# Patient Record
Sex: Female | Born: 1940 | Race: White | Hispanic: No | Smoking: Current every day smoker
Health system: Southern US, Community
[De-identification: ages and names within clinical notes are randomized; demographics above are authoritative.]

## PROBLEM LIST (undated history)

## (undated) DIAGNOSIS — F32A Depression, unspecified: Secondary | ICD-10-CM

## (undated) DIAGNOSIS — C3490 Malignant neoplasm of unspecified part of unspecified bronchus or lung: Secondary | ICD-10-CM

## (undated) DIAGNOSIS — M1990 Unspecified osteoarthritis, unspecified site: Secondary | ICD-10-CM

## (undated) DIAGNOSIS — R918 Other nonspecific abnormal finding of lung field: Secondary | ICD-10-CM

## (undated) DIAGNOSIS — E785 Hyperlipidemia, unspecified: Secondary | ICD-10-CM

## (undated) DIAGNOSIS — J449 Chronic obstructive pulmonary disease, unspecified: Secondary | ICD-10-CM

## (undated) DIAGNOSIS — Z8614 Personal history of Methicillin resistant Staphylococcus aureus infection: Secondary | ICD-10-CM

## (undated) DIAGNOSIS — F329 Major depressive disorder, single episode, unspecified: Secondary | ICD-10-CM

## (undated) HISTORY — DX: Unspecified osteoarthritis, unspecified site: M19.90

## (undated) HISTORY — PX: TONSILLECTOMY: SUR1361

## (undated) HISTORY — DX: Depression, unspecified: F32.A

## (undated) HISTORY — DX: Other nonspecific abnormal finding of lung field: R91.8

## (undated) HISTORY — DX: Hyperlipidemia, unspecified: E78.5

## (undated) HISTORY — PX: TUBAL LIGATION: SHX77

## (undated) HISTORY — DX: Major depressive disorder, single episode, unspecified: F32.9

## (undated) HISTORY — PX: CATARACT EXTRACTION W/ INTRAOCULAR LENS  IMPLANT, BILATERAL: SHX1307

## (undated) HISTORY — DX: Malignant neoplasm of unspecified part of unspecified bronchus or lung: C34.90

---

## 2003-01-04 HISTORY — PX: CEREBRAL ANEURYSM REPAIR: SHX164

## 2015-01-04 HISTORY — PX: TOTAL HIP ARTHROPLASTY: SHX124

## 2017-01-01 ENCOUNTER — Other Ambulatory Visit: Payer: Self-pay | Admitting: Internal Medicine

## 2017-01-01 DIAGNOSIS — Z1231 Encounter for screening mammogram for malignant neoplasm of breast: Secondary | ICD-10-CM

## 2017-01-22 ENCOUNTER — Ambulatory Visit: Payer: Medicare Other

## 2017-01-28 ENCOUNTER — Ambulatory Visit
Admission: RE | Admit: 2017-01-28 | Discharge: 2017-01-28 | Disposition: A | Discharge status: Home / Self Care | Payer: Medicare Other | Source: Ambulatory Visit | Attending: Internal Medicine | Admitting: Internal Medicine

## 2017-01-28 DIAGNOSIS — Z1231 Encounter for screening mammogram for malignant neoplasm of breast: Secondary | ICD-10-CM | POA: Insufficient documentation

## 2017-01-28 DIAGNOSIS — R928 Other abnormal and inconclusive findings on diagnostic imaging of breast: Secondary | ICD-10-CM | POA: Insufficient documentation

## 2017-01-31 ENCOUNTER — Other Ambulatory Visit: Payer: Self-pay | Admitting: Internal Medicine

## 2017-01-31 DIAGNOSIS — N6489 Other specified disorders of breast: Secondary | ICD-10-CM

## 2017-01-31 DIAGNOSIS — R928 Other abnormal and inconclusive findings on diagnostic imaging of breast: Secondary | ICD-10-CM

## 2017-02-07 ENCOUNTER — Encounter: Payer: Self-pay | Admitting: Radiology

## 2017-02-07 ENCOUNTER — Ambulatory Visit
Admission: RE | Admit: 2017-02-07 | Discharge: 2017-02-07 | Disposition: A | Discharge status: Home / Self Care | Payer: Medicare Other | Source: Ambulatory Visit | Attending: Internal Medicine | Admitting: Internal Medicine

## 2017-02-07 DIAGNOSIS — R928 Other abnormal and inconclusive findings on diagnostic imaging of breast: Secondary | ICD-10-CM

## 2017-02-07 DIAGNOSIS — N6489 Other specified disorders of breast: Secondary | ICD-10-CM | POA: Insufficient documentation

## 2017-05-03 HISTORY — PX: OTHER SURGICAL HISTORY: SHX169

## 2017-05-16 DIAGNOSIS — H3320 Serous retinal detachment, unspecified eye: Secondary | ICD-10-CM | POA: Insufficient documentation

## 2017-05-16 DIAGNOSIS — H3321 Serous retinal detachment, right eye: Secondary | ICD-10-CM | POA: Insufficient documentation

## 2017-12-06 ENCOUNTER — Encounter (INDEPENDENT_AMBULATORY_CARE_PROVIDER_SITE_OTHER): Payer: Self-pay

## 2017-12-06 ENCOUNTER — Other Ambulatory Visit: Payer: Self-pay

## 2017-12-06 ENCOUNTER — Inpatient Hospital Stay: Payer: Medicare Other | Attending: Hematology and Oncology | Admitting: Hematology and Oncology

## 2017-12-06 ENCOUNTER — Encounter: Payer: Self-pay | Admitting: Hematology and Oncology

## 2017-12-06 ENCOUNTER — Inpatient Hospital Stay: Payer: Medicare Other

## 2017-12-06 ENCOUNTER — Encounter: Payer: Self-pay | Admitting: *Deleted

## 2017-12-06 VITALS — BP 135/78 | HR 87 | Temp 97.7°F | Resp 18 | Ht 63.39 in | Wt 131.3 lb

## 2017-12-06 DIAGNOSIS — F17210 Nicotine dependence, cigarettes, uncomplicated: Secondary | ICD-10-CM

## 2017-12-06 DIAGNOSIS — F329 Major depressive disorder, single episode, unspecified: Secondary | ICD-10-CM | POA: Insufficient documentation

## 2017-12-06 DIAGNOSIS — M1990 Unspecified osteoarthritis, unspecified site: Secondary | ICD-10-CM

## 2017-12-06 DIAGNOSIS — Z79899 Other long term (current) drug therapy: Secondary | ICD-10-CM | POA: Insufficient documentation

## 2017-12-06 DIAGNOSIS — R918 Other nonspecific abnormal finding of lung field: Secondary | ICD-10-CM

## 2017-12-06 LAB — COMPREHENSIVE METABOLIC PANEL
ALBUMIN: 4 g/dL (ref 3.5–5.0)
ALK PHOS: 76 U/L (ref 38–126)
ALT: 14 U/L (ref 0–44)
ANION GAP: 6 (ref 5–15)
AST: 22 U/L (ref 15–41)
BILIRUBIN TOTAL: 0.8 mg/dL (ref 0.3–1.2)
BUN: 18 mg/dL (ref 8–23)
CALCIUM: 9.1 mg/dL (ref 8.9–10.3)
CO2: 26 mmol/L (ref 22–32)
Chloride: 110 mmol/L (ref 98–111)
Creatinine, Ser: 0.94 mg/dL (ref 0.44–1.00)
GFR calc Af Amer: >60 mL/min (ref >60)
GFR calc non Af Amer: 57 mL/min — ABNORMAL LOW (ref >60)
GLUCOSE: 96 mg/dL (ref 70–99)
Potassium: 4.2 mmol/L (ref 3.5–5.1)
Sodium: 142 mmol/L (ref 135–145)
TOTAL PROTEIN: 6.4 g/dL — AB (ref 6.5–8.1)

## 2017-12-06 LAB — CBC WITH DIFFERENTIAL/PLATELET
Basophils Absolute: 0.1 10*3/uL (ref 0–0.1)
Basophils Relative: 1 %
EOS PCT: 4 %
Eosinophils Absolute: 0.2 10*3/uL (ref 0–0.7)
HEMATOCRIT: 40.7 % (ref 35.0–47.0)
Hemoglobin: 14.2 g/dL (ref 12.0–16.0)
LYMPHS ABS: 1.5 10*3/uL (ref 1.0–3.6)
LYMPHS PCT: 27 %
MCH: 32.9 pg (ref 26.0–34.0)
MCHC: 34.9 g/dL (ref 32.0–36.0)
MCV: 94.3 fL (ref 80.0–100.0)
MONO ABS: 0.4 10*3/uL (ref 0.2–0.9)
MONOS PCT: 8 %
Neutro Abs: 3.5 10*3/uL (ref 1.4–6.5)
Neutrophils Relative %: 60 %
PLATELETS: 190 10*3/uL (ref 150–440)
RBC: 4.31 MIL/uL (ref 3.80–5.20)
RDW: 13.3 % (ref 11.5–14.5)
WBC: 5.7 10*3/uL (ref 3.6–11.0)

## 2017-12-06 NOTE — Progress Notes (Signed)
  Oncology Nurse Navigator Documentation  Navigator Location: [facility] (12/06/17 1100) Referral date to RadOnc/MedOnc: 11/28/17 (12/06/17 1100) )Navigator Encounter Type: Clinic/MDC (12/06/17 1100)   Abnormal Finding Date: 11/25/17 (12/06/17 1100)           Multidisiplinary Clinic Date: 12/06/17 (12/06/17 1100) Multidisiplinary Clinic Type: Thoracic (12/06/17 1100)   Patient Visit Type: MedOnc (12/06/17 1100) Treatment Phase: Abnormal Scans (12/06/17 1100) Barriers/Navigation Needs: Coordination of Care (12/06/17 1100)   Interventions: Coordination of Care (12/06/17 1100)   Coordination of Care: Appts;Radiology (12/06/17 1100)        Acuity: Level 2 (12/06/17 1100)   Acuity Level 2: Initial guidance, education and coordination as needed;Educational needs;Assistance expediting appointments (12/06/17 1100)  met with patient and her daughter during initial med-onc consultation with Dr. Mike Gip. All imaging reviewed with patient. Informed that will arrange for PET scan and PFT's to see if patient will need surgical evaluation. Reviewed upcoming appts with patient. Contact info given and informed to call with any further questions or needs. Pt and daughter verbalized understanding.    Time Spent with Patient: 60 (12/06/17 1100)

## 2017-12-06 NOTE — Progress Notes (Signed)
[facility] day:  12/06/2017  Chief Complaint: Latoya Lynch is a 77 y.o. female with a lung mass who is referred in consultation by Dr. Elijio Lynch for assessment and management.  HPI: The patient has a 30 pack year smoking history.  She currently smokes 1/2 pack per day.  She denies any respiratory symptoms.  The patient has a history of cerebral aneurysm in 2004.  Follow-up imaging revealed "a little spot" (no report available).  Follow-up chest CT was ordered.  Chest CT on 11/25/2017 at [facility] revealed a 1.7 x 1.1 x 1.3 cm spiculated mass at the right lung base suspicious for primary pulmonary neoplasm.  Symptomatically, she is feeling "fine". She suffers from occasional migraines. Patient had recent surgery on her RIGHT eye to correct a retinal detachment. Patient denies shortness of breath. She is a smoker, which causes her to have a chronic cough. Patient denies bleeding; no hematochezia, melena, or gross hematuria. She refuses to have a colonoscopy, but did have non-invasive screening (Cologuard) last year that result as negative.   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 She uses OTC interventions, which are effective. Patient has an area of skin concern to the dorsal aspect of her LEFT hand.   Patient has chronic balance issues. Patient notes that her gait is "wobbly".   Patient's maternal grandmother developed terminal "spinal cancer" in her 89-80s.   Past Medical History:  Diagnosis Date  . Depression   . Hyperlipidemia     Past Surgical History:  Procedure Laterality Date  . 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 retinal detatchment Right 05/2017  . TONSILLECTOMY    . TOTAL HIP ARTHROPLASTY Right 01/2015    Family History  Problem Relation Age of Onset  . Heart attack Mother   . Cancer Maternal Grandmother 80       spine ca    Social History:  reports that she has been smoking  cigarettes. She has a 30.00 pack-year smoking history. She has never used smokeless tobacco. She reports that she drank alcohol. She reports that she does not use drugs.  She has smoked 1/2 pack/day since age 24.  She denies any exposure to radiation or toxins.  Patient recently moved from [place] to [place].  Sh lives alone with 2 cats.  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  The patient is accompanied by her daughter, Latoya Lynch,  today.  Allergies:  Allergies  Allergen Reactions  . Peanut Oil Anaphylaxis  . Prochlorperazine Anaphylaxis    Current Medications: Current Outpatient Medications  Medication Sig Dispense Refill  . b complex vitamins tablet Take 1 tablet by mouth daily.     Marland Kitchen buPROPion (WELLBUTRIN SR) 150 MG 12 hr tablet Take 150 mg by mouth daily.     . naproxen sodium (ALEVE) 220 MG tablet Take 220 mg by mouth.    . Omega-3 1000 MG CAPS Take by mouth.     . rosuvastatin (CRESTOR) 10 MG tablet TK 1 T PO QPM  0  . calcium-vitamin D (OSCAL WITH D) 500-200 MG-UNIT TABS tablet Take by mouth.    . EPINEPHrine (EPIPEN JR) 0.15 MG/0.3ML injection Inject into the muscle.    . ibuprofen (ADVIL,MOTRIN) 200 MG tablet Take 200 mg by mouth.      No current facility-administered medications for this visit.  Review of Systems:  GENERAL:  Feels well.  No fevers, sweats or weight loss. PERFORMANCE STATUS (ECOG):  1 HEENT:  s/p right retinal detachment surgery in 05/2017.  No runny nose, sore throat, mouth sores or tenderness. Lungs: No shortness of breath.  Occasional cough.  No hemoptysis. Cardiac:  No chest pain, palpitations, orthopnea, or PND. GI:  No nausea, vomiting, diarrhea, constipation, melena or hematochezia.  Cologuard 2018. GU:  No urgency, frequency, dysuria, or hematuria. Musculoskeletal:  s/p right hip replacement 2016 with intermittent pain.   Back pain treated with Aleve prn. No muscle tenderness. Extremities:  No pain or swelling. Skin:  Left wrist skin nodule (plans to see dermatologist).  No rashes or skin changes. Neuro:  Occasional headache.  Coordination/balance issues since brain aneurysm in 2004.  No numbness or focal weakness. Endocrine:  No diabetes, thyroid issues, hot flashes or night sweats. Psych:  No mood changes, depression or anxiety. Pain:  No focal pain. Review of systems:  All other systems reviewed and found to be negative.  Physical Exam: Blood pressure 135/78, pulse 87, temperature 97.7 F (36.5 C), temperature source Tympanic, resp. rate 18, height 5' 3.39" (1.61 m), weight 131 lb 4.8 oz (59.6 kg). GENERAL:  Well developed, well nourished, woman sitting comfortably in the exam room in no acute distress. MENTAL STATUS:  Alert and oriented to person, place and time. HEAD:  Short red hair.  Normocephalic, atraumatic, face symmetric, no Cushingoid features. EYES:  Glasses.  Hazel eyes.  Pupils equal round and reactive to light and accomodation.  No conjunctivitis or scleral icterus. ENT:  Oropharynx clear without lesion.  Tongue normal.  Partial metal plate.  Mucous membranes moist.  RESPIRATORY:  Clear to auscultation without rales, wheezes or rhonchi. CARDIOVASCULAR:  Regular rate and rhythm without murmur, rub or gallop. ABDOMEN:  Soft, non-tender, with active bowel sounds, and no hepatosplenomegaly.  No masses. SKIN:  Left wrist skin lesion.  No rashes, ulcers or lesions. EXTREMITIES: No edema, no skin discoloration or tenderness.  No palpable cords. LYMPH NODES: No palpable cervical, supraclavicular, axillary or inguinal adenopathy  NEUROLOGICAL: Unremarkable. PSYCH:  Appropriate.   No visits with results within 3 Day(s) from this visit.  Latest known visit with results is:  No results found for any previous visit.    Assessment:  Latoya Lynch is a 77 y.o. female with spiculated right  lung lesion found incidentally.  She has a 30 pack year smoking.  Chest CT on 11/25/2017 at [facility] revealed a 1.7 x 1.1 x 1.3 cm spiculated mass at the right lung base suspicious for primary pulmonary neoplasm.  Symptomatically, she feels "fine".  She denies any respiratory symptoms.  Exam is unremarkable.   Plan: 1.  Labs today:  CBC with diff, CMP. 2.  Right lung nodule:  Discuss results of CT scan.  Images not available during her appointment for review as performed at outside facility.  Disk obtained and will be loaded onto system.  Discuss concern for lung cancer.  Report indicates no adenopathy (N0).  Lesion is small (T1b).  Clinical stage IA if primary lung cancer diagnosed.  Discuss plan for PET scan and PFTs.  If c/w lung cancer and early stage disease, discuss referral to cardiothoracic surgery.  Patient states if surgery needed, she will postpone until after her art show on 10/26 and 10/27.  Present at tumor board. 3.  Schedule PET scan. 4.  Schedule PFTs. 5.  RTC after PET scan for MD assessment, review  of results, and discussion regarding direction of therapy.   Honor Loh, NP  12/06/2017, 10:24 AM   I saw and evaluated the patient, participating in the key portions of the service and reviewing pertinent diagnostic studies and records.  I reviewed the nurse practitioner's note and agree with the findings and the plan.  The assessment and plan were discussed with the patient.  Additional diagnostic studies of PET are needed to clarify stage and would change the clinical management.  Multiple questions were asked by the patient and answered.   Nolon Stalls, MD 12/06/2017,10:24 AM

## 2017-12-06 NOTE — Progress Notes (Signed)
Here for new pt evaluation.  

## 2017-12-07 ENCOUNTER — Encounter: Payer: Self-pay | Admitting: Hematology and Oncology

## 2017-12-09 ENCOUNTER — Ambulatory Visit
Admission: RE | Admit: 2017-12-09 | Discharge: 2017-12-09 | Disposition: A | Discharge status: Home / Self Care | Payer: Self-pay | Source: Ambulatory Visit | Attending: Hematology and Oncology | Admitting: Hematology and Oncology

## 2017-12-09 ENCOUNTER — Other Ambulatory Visit: Payer: Self-pay | Admitting: Hematology and Oncology

## 2017-12-09 DIAGNOSIS — R911 Solitary pulmonary nodule: Secondary | ICD-10-CM

## 2017-12-10 ENCOUNTER — Ambulatory Visit: Payer: Medicare Other | Attending: Urgent Care

## 2017-12-10 DIAGNOSIS — R918 Other nonspecific abnormal finding of lung field: Secondary | ICD-10-CM

## 2017-12-10 MED ORDER — ALBUTEROL SULFATE (2.5 MG/3ML) 0.083% IN NEBU
2.5000 mg | INHALATION_SOLUTION | Freq: Once | RESPIRATORY_TRACT | Status: AC
  Administered 2017-12-10: 2.5 mg via RESPIRATORY_TRACT
  Filled 2017-12-10: qty 3

## 2017-12-13 ENCOUNTER — Ambulatory Visit
Admission: RE | Admit: 2017-12-13 | Discharge: 2017-12-13 | Disposition: A | Discharge status: Home / Self Care | Payer: Medicare Other | Source: Ambulatory Visit | Attending: Urgent Care | Admitting: Urgent Care

## 2017-12-13 DIAGNOSIS — R918 Other nonspecific abnormal finding of lung field: Secondary | ICD-10-CM | POA: Diagnosis not present

## 2017-12-13 LAB — GLUCOSE, CAPILLARY: GLUCOSE-CAPILLARY: 110 mg/dL — AB (ref 70–99)

## 2017-12-13 MED ORDER — FLUDEOXYGLUCOSE F - 18 (FDG) INJECTION
7.2000 | Freq: Once | INTRAVENOUS | Status: AC | PRN
  Administered 2017-12-13: 7.2 via INTRAVENOUS

## 2017-12-19 ENCOUNTER — Other Ambulatory Visit: Payer: Self-pay

## 2017-12-19 ENCOUNTER — Encounter: Payer: Self-pay | Admitting: Hematology and Oncology

## 2017-12-19 ENCOUNTER — Other Ambulatory Visit: Payer: Self-pay | Admitting: *Deleted

## 2017-12-19 ENCOUNTER — Inpatient Hospital Stay (HOSPITAL_BASED_OUTPATIENT_CLINIC_OR_DEPARTMENT_OTHER): Payer: Medicare Other | Admitting: Hematology and Oncology

## 2017-12-19 ENCOUNTER — Encounter: Payer: Self-pay | Admitting: *Deleted

## 2017-12-19 DIAGNOSIS — M1990 Unspecified osteoarthritis, unspecified site: Secondary | ICD-10-CM | POA: Diagnosis not present

## 2017-12-19 DIAGNOSIS — R911 Solitary pulmonary nodule: Secondary | ICD-10-CM

## 2017-12-19 DIAGNOSIS — F17210 Nicotine dependence, cigarettes, uncomplicated: Secondary | ICD-10-CM

## 2017-12-19 DIAGNOSIS — F329 Major depressive disorder, single episode, unspecified: Secondary | ICD-10-CM | POA: Diagnosis not present

## 2017-12-19 DIAGNOSIS — R918 Other nonspecific abnormal finding of lung field: Secondary | ICD-10-CM | POA: Diagnosis not present

## 2017-12-19 DIAGNOSIS — Z79899 Other long term (current) drug therapy: Secondary | ICD-10-CM

## 2017-12-19 NOTE — Progress Notes (Signed)
[facility] day:  12/19/2017  Chief Complaint: Latoya Lynch is a 77 y.o. female with a right lung mass who is seen for review of interval PET scan and discussion regarding direction of therapy.  HPI: The patient was last seen in the medical oncology clinic on 12/06/2017 for initial consultation.  She was noted to have an incidental 1.7 cm spiculated mas in the right lung base worrisome for lung cancer.  She has a 30 pack year smoking history.  PET scan on 12/13/2017 revealed a 17 x 15 mm hypermetabolic right lower lobe lung lesion (SUV 5.17) concerning for primary lung neoplasm.  There was no mediastinal/hilar lymphadenopathy or evidence of metastatic disease involving the neck, chest, abdomen/pelvis or osseous structures.  She underwent PFTs on 12/10/2017.  FEV1 was 1.95 liters (62%).  DLCO was 15.77 ml/min/mmHg (62%).  During the interim, patient is feeling "ok" today. She denies any acute concerns. Breathing is stable. Intermittent non-productive cough persists. Patient denies that she has not experienced any B symptoms. She denies any interval infections.   Patient advises that she maintains an adequate appetite. She is eating well. Weight today is 131 lb 14.4 oz (59.8 kg), which compared to her last visit to the clinic, represents a stable weight.   Patient denies pain in the clinic today.   Past Medical History:  Diagnosis Date  . Depression   . Hyperlipidemia     Past Surgical History:  Procedure Laterality Date  . 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 retinal detatchment Right 05/2017  . TONSILLECTOMY    . TOTAL HIP ARTHROPLASTY Right 01/2015    Family History  Problem Relation Age of Onset  . Heart attack Mother   . Cancer Maternal Grandmother 80       spine ca    Social History:  reports that she has been smoking cigarettes. She has a 30.00 pack-year smoking history. She has never used smokeless tobacco. She  reports that she drank alcohol. She reports that she does not use drugs.  She has smoked 1/2 pack/day since age 38.  She denies any exposure to radiation or toxins.  Patient recently moved from [place] to [place].  Sh lives alone with 2 cats.  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  The patient is accompanied by her daughter, [name],  today.  Allergies:  Allergies  Allergen Reactions  . Peanut Oil Anaphylaxis  . Prochlorperazine Anaphylaxis    Current Medications: Current Outpatient Medications  Medication Sig Dispense Refill  . buPROPion (WELLBUTRIN SR) 150 MG 12 hr tablet Take 150 mg by mouth daily.     . Loratadine (CLARITIN PO) Take 1 tablet by mouth daily.    . naproxen sodium (ALEVE) 220 MG tablet Take 440 mg by mouth.     . Omega-3 1000 MG CAPS Take by mouth.     . Riboflavin 100 MG TABS Take 2 tablets by mouth daily.    . rosuvastatin (CRESTOR) 10 MG tablet TK 1 T PO QPM  0  . EPINEPHrine (EPIPEN JR) 0.15 MG/0.3ML injection Inject into the muscle.     No current facility-administered medications for this visit.     Review of Systems:  GENERAL:  Feels "ok".  No fevers, sweats or weight loss.  Weight stable. PERFORMANCE STATUS (ECOG):  1 HEENT: s/p right retinal detachment  surgery in 05/2017.  No runny nose, sore throat, mouth sores or tenderness. Lungs: No shortness of breath.  Rare non-productive cough.  No hemoptysis. Cardiac:  No chest pain, palpitations, orthopnea, or PND. GI:  No nausea, vomiting, diarrhea, constipation, melena or hematochezia.  Cologuard 2018. GU:  No urgency, frequency, dysuria, or hematuria. Musculoskeletal:  Back pain treated with Aleve.  s/p right hip replacement 2016 (intermittent pain).  No muscle tenderness. Extremities:  No pain or swelling. Skin:  Left wrist nodule.  No rashes or skin changes. Neuro:  Occasional  headache.  Coordination and balance issues since brain aneurysm in 2004.  No numbness or focal weakness. Endocrine:  No diabetes, thyroid issues, hot flashes or night sweats. Psych:  No mood changes, depression or anxiety. Pain:  No focal pain. Review of systems:  All other systems reviewed and found to be negative.   Physical Exam: Blood pressure 136/84, pulse 89, temperature 98.9 F (37.2 C), temperature source Tympanic, resp. rate 20, height 5' 3.39" (1.61 m), weight 131 lb 14.4 oz (59.8 kg). GENERAL:  Well developed, well nourished, woman sitting comfortably in the exam room in no acute distress. MENTAL STATUS:  Alert and oriented to person, place and time. HEAD:  Short red hair.  Normocephalic, atraumatic, face symmetric, no Cushingoid features. EYES:  Glasses.  Hazel eyes.  Pupils equal round and reactive to light and accomodation.  No conjunctivitis or scleral icterus. NEUROLOGICAL: Unremarkable. PSYCH:  Appropriate.    No visits with results within 3 Day(s) from this visit.  Latest known visit with results is:  Hospital Outpatient Visit on 12/13/2017  Component Date Value Ref Range Status  . Glucose-Capillary 12/13/2017 110* 70 - 99 mg/dL Final    Assessment:  Latoya Lynch is a 77 y.o. female with spiculated right lung lesion found incidentally.  She has a 30 pack year smoking.  Chest CT on 11/25/2017 at [facility] revealed a 1.7 x 1.1 x 1.3 cm spiculated mass at the right lung base suspicious for primary pulmonary neoplasm.  PET scan on 12/13/2017 revealed a 17 x 15 mm hypermetabolic right lower lobe lung lesion (SUV 5.17) concerning for primary lung neoplasm.  There was no mediastinal/hilar lymphadenopathy or evidence of metastatic disease involving the neck, chest, abdomen/pelvis or osseous structures.  PFTs on 12/10/2017 revealed an FEV1 of 1.95 liters (62%) and DLCO 15.77 ml/min/mmHg (62%).  Symptomatically, she denies any new complaints.     Plan: 1.  Right lung nodule:  Review interval PET scan.  Images personally reviewed.  Agree with radiology interpretation.  No other lesion noted on PET scan except for 1.7 cm RLL lesion.  No mediastinal adenopathy.  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  Discuss potential of biopsy to confirm malignancy or possibly upfront surgery.  Patient has follow-up with Dr. Genevive Bi, thoracic surgery, tomorrow to discuss further. 3.  RTC after surgical consultation.   Honor Loh, NP  12/19/2017, 3:05 PM   I saw and evaluated the patient, participating in the key portions of the service and reviewing pertinent diagnostic studies and records.  I reviewed the nurse practitioner's note and agree with the findings and the plan.  The assessment and plan were discussed with the patient.  Several questions were asked by the patient and answered.   Nolon Stalls, MD 12/19/2017,3:05 PM

## 2017-12-20 ENCOUNTER — Ambulatory Visit (INDEPENDENT_AMBULATORY_CARE_PROVIDER_SITE_OTHER): Payer: Medicare Other | Admitting: Cardiothoracic Surgery

## 2017-12-20 ENCOUNTER — Encounter: Payer: Self-pay | Admitting: Cardiothoracic Surgery

## 2017-12-20 ENCOUNTER — Other Ambulatory Visit: Payer: Self-pay

## 2017-12-20 VITALS — BP 163/90 | HR 95 | Temp 97.5°F | Ht 63.39 in | Wt 131.0 lb

## 2017-12-20 DIAGNOSIS — J984 Other disorders of lung: Secondary | ICD-10-CM

## 2017-12-20 NOTE — Progress Notes (Signed)
  Oncology Nurse Navigator Documentation  Navigator Location: [facility] (12/19/17 1600)   )Navigator Encounter Type: [facility] Follow-up (12/19/17 1600)                     Patient Visit Type: MedOnc (12/19/17 1600)   Barriers/Navigation Needs: Coordination of Care (12/19/17 1600)   Interventions: Coordination of Care (12/19/17 1600)   Coordination of Care: Appts (12/19/17 1600)               met with patient and her daughter during follow up visit with Dr. Mike Gip. PET scan results and conference recommendations reviewed with patient. Patient scheduled to see Dr. Genevive Bi on Friday 10/18 to discuss surgical approach. Reviewed appts with patient. All questions answered during visit. Nothing further needed at this time.    Time Spent with Patient: 30 (12/19/17 1600)

## 2017-12-20 NOTE — Progress Notes (Signed)
Patient ID: Latoya Lynch, female   DOB: 11-06-1940, 77 y.o.   MRN: [id number]  Chief Complaint  Patient presents with  . New Patient (Initial Visit)    Lung nodule    Referred By Dr. Elsie Saas  Reason for Referral RLL mass  HPI Location, Quality, Duration, Severity, Timing, Context, Modifying Factors, Associated Signs and Symptoms.  Latoya Lynch is a 77 y.o. female.  She had recently moved here from [place] and establish care with [facility].  During part of her initial evaluation she expressed concern about her history of intracerebral aneurysm and her positive family history of abdominal aortic aneurysms.  For that reason she had a CT scan of the chest and abdomen performed.  That revealed a 2 cm right lower lobe nodule.  She then had a PET scan done in the PET scan was positive in the right lower lobe without evidence of metastatic disease.  She is a lifelong smoker and continues to smoke a half a pack cigarettes a day.  She does not walk up and down steps because of her knees and her back but she is able to walk on the level.  She saw Dr. Mike Gip and Dr. Mike Gip retained some pulmonary function studies.  Those reveal an FEV1 and DLCO between 60 and 65%.  She comes in today to discuss other options.  She does get short of breath with some exertion.  She is gained some weight over the last 6 months.  She has had no hemoptysis.  Her past medical history is significant for intracerebral aneurysm for which she underwent a craniotomy and clipping.  She does not have any prior CT scans that she knows of although she will attempt to get those from [place] if they are available.   Past Medical History:  Diagnosis Date  . Depression   . Hyperlipidemia     Past Surgical History:  Procedure Laterality Date  . 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 retinal detatchment Right 05/2017  . TONSILLECTOMY    . TOTAL HIP ARTHROPLASTY Right 01/2015    Family  History  Problem Relation Age of Onset  . Heart attack Mother   . Cancer Maternal Grandmother 80       spine ca    Social History Social History   Tobacco Use  . Smoking status: Current Every Day Smoker    Packs/day: 0.50    Years: 60.00    Pack years: 30.00    Types: Cigarettes  . Smokeless tobacco: Never Used  Substance Use Topics  . Alcohol use: Not Currently  . Drug use: Never    Allergies  Allergen Reactions  . Peanut Oil Anaphylaxis  . Prochlorperazine Anaphylaxis    Current Outpatient Medications  Medication Sig Dispense Refill  . buPROPion (WELLBUTRIN SR) 150 MG 12 hr tablet Take 150 mg by mouth daily.     Marland Kitchen EPINEPHrine (EPIPEN JR) 0.15 MG/0.3ML injection Inject into the muscle.    . Loratadine (CLARITIN PO) Take 1 tablet by mouth daily.    . naproxen sodium (ALEVE) 220 MG tablet Take 440 mg by mouth.     . Omega-3 1000 MG CAPS Take by mouth.     . Riboflavin 100 MG TABS Take 2 tablets by mouth daily.    . rosuvastatin (CRESTOR) 10 MG tablet TK 1 T PO QPM  0   No current facility-administered medications for this visit.       Review of Systems A complete review of systems was  asked and was negative except for the following positive findings shortness of breath with exertion, knee and back pain  Blood pressure (!) 163/90, pulse 95, temperature (!) 97.5 F (36.4 C), temperature source Skin, height 5' 3.39" (1.61 m), weight 131 lb (59.4 kg), SpO2 96 %.  Physical Exam CONSTITUTIONAL:  Pleasant, well-developed, well-nourished, and in no acute distress. EYES: Pupils equal and reactive to light, Sclera non-icteric EARS, NOSE, MOUTH AND THROAT:  The oropharynx was clear.  Dentition is good repair.  Oral mucosa pink and moist. LYMPH NODES:  Lymph nodes in the neck and axillae were normal RESPIRATORY:  Lungs were clear.  Normal respiratory effort without pathologic use of accessory muscles of respiration CARDIOVASCULAR: Heart was regular without murmurs.  There  were no carotid bruits. GI: The abdomen was soft, nontender, and nondistended. There were no palpable masses. There was no hepatosplenomegaly. There were normal bowel sounds in all quadrants. GU:  Rectal deferred.   MUSCULOSKELETAL:  Normal muscle strength and tone.  No clubbing or cyanosis.   SKIN:  There were no pathologic skin lesions.  There were no nodules on palpation. NEUROLOGIC:  Sensation is normal.  Cranial nerves are grossly intact. PSYCH:  Oriented to person, place and time.  Mood and affect are normal.  Data Reviewed CT and PET  I have personally reviewed the patient's imaging, laboratory findings and medical records.    Assessment    There is a PET +2 cm right lower lobe nodule.    Plan    I had a long discussion with her regarding the likely diagnosis.  I told her that there is an overwhelming evidence that this was likely a malignancy.  However there are some alternative diagnoses.  I explained to her that we would like to see any prior CT scans that she may have had and she thinks that there might be some imaging studies from [place] but she is not sure what those would be.  She will contact her PCP out in [place] to see what may be available for our review.  We also discussed the possibility of performing some biopsy prior to any intervention.  I reviewed with her the indications of risks of right thoracotomy and right lung resection.  Risks of bleeding, infection, air leak and death were all reviewed.  Advantages and disadvantages of surgery over radiation were discussed.  After an extensive Miguel Dibble discussion with the patient she would like to contact her PCP out in [place] and she will be back in touch with [place] next week.       Nestor Lewandowsky, MD 12/20/2017, 11:59 AM

## 2017-12-20 NOTE — Patient Instructions (Addendum)
Please contact your previous doctor and check if you have ever had a CT Scan.  Then once we have images, then we could compare and then decide what will be the next week.      Please let us know once you have any questions.

## 2017-12-25 ENCOUNTER — Telehealth: Payer: Self-pay | Admitting: *Deleted

## 2017-12-25 DIAGNOSIS — R911 Solitary pulmonary nodule: Secondary | ICD-10-CM

## 2017-12-25 NOTE — Telephone Encounter (Signed)
Pt called to inform that she saw Dr. Genevive Bi last week and surgery was discussed with her. At this time, she does not want to have surgery and would like for our office to arrange biopsy. Per Gaspar Bidding, NP will order CT guided biopsy at this time and have pt to follow up with Dr. Mike Gip and Dr. Baruch Gouty after biopsy to review results and discuss treatment options. Pt informed that will scheduled appts and notify her once scheduled. Pt requests that biopsy be scheduled the week of Nov 11th due to upcoming art show. Order placed and checklist faxed to specialty scheduling. Will contact pt once appts scheduled.

## 2017-12-25 NOTE — Telephone Encounter (Signed)
error 

## 2017-12-30 ENCOUNTER — Other Ambulatory Visit: Payer: Self-pay | Admitting: *Deleted

## 2018-01-02 ENCOUNTER — Other Ambulatory Visit: Payer: Medicare Other

## 2018-01-02 NOTE — Progress Notes (Signed)
Tumor Board Documentation  Latoya Lynch was presented by Dr Mike Gip at our Tumor Board on 01/02/2018, which included representatives from medical oncology, radiation oncology, surgical oncology, surgical, radiology, research, internal medicine, navigation, pathology, pulmonology.  Latoya Lynch currently presents as a current patient with history of the following treatments: none.  Additionally, we reviewed previous medical and familial history, history of present illness, and recent lab results along with all available histopathologic and imaging studies. The tumor board considered available treatment options and made the following recommendations: Biopsy, Radiation therapy (primary modality)    The following procedures/referrals were also placed: No orders of the defined types were placed in this encounter.   Clinical Trial Status: unknown   Staging used:    National site-specific guidelines   were discussed with respect to the case.  Tumor board is a meeting of clinicians from various specialty areas who evaluate and discuss patients for whom a multidisciplinary approach is being considered. Final determinations in the plan of care are those of the provider(s). The responsibility for follow up of recommendations given during tumor board is that of the provider.   Today's extended care, comprehensive team conference, Latoya Lynch was not present for the discussion and was not examined.

## 2018-01-08 ENCOUNTER — Ambulatory Visit: Payer: Medicare Other

## 2018-01-09 ENCOUNTER — Other Ambulatory Visit: Payer: Self-pay | Admitting: Radiology

## 2018-01-13 ENCOUNTER — Ambulatory Visit
Admission: RE | Admit: 2018-01-13 | Discharge: 2018-01-13 | Disposition: A | Discharge status: Home / Self Care | Payer: Medicare Other | Source: Ambulatory Visit | Attending: Interventional Radiology | Admitting: Interventional Radiology

## 2018-01-13 ENCOUNTER — Ambulatory Visit
Admission: RE | Admit: 2018-01-13 | Discharge: 2018-01-13 | Disposition: A | Discharge status: Home / Self Care | Payer: Medicare Other | Source: Ambulatory Visit | Attending: Urgent Care | Admitting: Urgent Care

## 2018-01-13 ENCOUNTER — Other Ambulatory Visit: Payer: Self-pay

## 2018-01-13 DIAGNOSIS — R911 Solitary pulmonary nodule: Secondary | ICD-10-CM | POA: Insufficient documentation

## 2018-01-13 DIAGNOSIS — Z9889 Other specified postprocedural states: Secondary | ICD-10-CM | POA: Insufficient documentation

## 2018-01-13 LAB — BASIC METABOLIC PANEL
ANION GAP: 7 (ref 5–15)
BUN: 17 mg/dL (ref 8–23)
CALCIUM: 8.6 mg/dL — AB (ref 8.9–10.3)
CO2: 25 mmol/L (ref 22–32)
Chloride: 109 mmol/L (ref 98–111)
Creatinine, Ser: 0.93 mg/dL (ref 0.44–1.00)
GFR, EST NON AFRICAN AMERICAN: 58 mL/min — AB (ref >60)
Glucose, Bld: 104 mg/dL — ABNORMAL HIGH (ref 70–99)
POTASSIUM: 3.6 mmol/L (ref 3.5–5.1)
SODIUM: 141 mmol/L (ref 135–145)

## 2018-01-13 LAB — CBC WITH DIFFERENTIAL/PLATELET
ABS IMMATURE GRANULOCYTES: 0.02 10*3/uL (ref 0.00–0.07)
BASOS PCT: 1 %
Basophils Absolute: 0.1 10*3/uL (ref 0.0–0.1)
EOS ABS: 0.2 10*3/uL (ref 0.0–0.5)
Eosinophils Relative: 3 %
HCT: 41.2 % (ref 36.0–46.0)
Hemoglobin: 14.5 g/dL (ref 12.0–15.0)
IMMATURE GRANULOCYTES: 0 %
Lymphocytes Relative: 38 %
Lymphs Abs: 2.5 10*3/uL (ref 0.7–4.0)
MCH: 32.2 pg (ref 26.0–34.0)
MCHC: 35.2 g/dL (ref 30.0–36.0)
MCV: 91.6 fL (ref 80.0–100.0)
Monocytes Absolute: 0.5 10*3/uL (ref 0.1–1.0)
Monocytes Relative: 7 %
NEUTROS ABS: 3.3 10*3/uL (ref 1.7–7.7)
NEUTROS PCT: 51 %
PLATELETS: 192 10*3/uL (ref 150–400)
RBC: 4.5 MIL/uL (ref 3.87–5.11)
RDW: 13.2 % (ref 11.5–15.5)
WBC: 6.6 10*3/uL (ref 4.0–10.5)
nRBC: 0 % (ref 0.0–0.2)

## 2018-01-13 LAB — PROTIME-INR
INR: 1.01
PROTHROMBIN TIME: 13.2 s (ref 11.4–15.2)

## 2018-01-13 MED ORDER — MIDAZOLAM HCL 2 MG/2ML IJ SOLN
INTRAMUSCULAR | Status: AC | PRN
  Administered 2018-01-13: 0.5 mg via INTRAVENOUS

## 2018-01-13 MED ORDER — FENTANYL CITRATE (PF) 100 MCG/2ML IJ SOLN
INTRAMUSCULAR | Status: AC | PRN
  Administered 2018-01-13 (×2): 25 ug via INTRAVENOUS

## 2018-01-13 MED ORDER — MIDAZOLAM HCL 5 MG/5ML IJ SOLN
INTRAMUSCULAR | Status: AC
  Filled 2018-01-13: qty 5

## 2018-01-13 MED ORDER — FENTANYL CITRATE (PF) 100 MCG/2ML IJ SOLN
INTRAMUSCULAR | Status: AC
  Filled 2018-01-13: qty 2

## 2018-01-13 MED ORDER — SODIUM CHLORIDE 0.9 % IV SOLN
INTRAVENOUS | Status: DC
  Administered 2018-01-13: 09:00:00 via INTRAVENOUS

## 2018-01-13 MED ORDER — LIDOCAINE HCL (PF) 1 % IJ SOLN
INTRAMUSCULAR | Status: AC | PRN
  Administered 2018-01-13: 3 mL

## 2018-01-13 NOTE — Progress Notes (Signed)
Patient clinically stable post RLL biopsy via Ct per Dr Pascal Lux, tolerated well. Vitals stable. NPO until after CXR @F  1200. Son in Sports coach at bedside. Dr Pascal Lux out to speak with him with questions answered regarding procedure in which patient tolerated well. Sinus rhythm per monitor. Denies complaints at this time.

## 2018-01-13 NOTE — Procedures (Signed)
Pre procedural Dx: Indeterminate hypermetabolic right lower lobe pulmonary nodule.  Post procedural Dx: Same  Technically successful CT guided biopsy of Indeterminate hypermetabolic right lower lobe pulmonary nodule.   EBL: None.   Complications: None immediate.   Ronny Bacon, MD Pager #: [phone]

## 2018-01-13 NOTE — Consult Note (Signed)
Chief Complaint: Indeterminate pulmonary nodule  Referring Physician(s): [place]an  Patient Status: [facility] - Out-pt  History of Present Illness: Latoya Lynch is a 77 y.o. female with past medical history significant for depression and hyperlipidemia who was found to have an indeterminate right lower lobe pulmonary nodule on CT abdomen and pelvis performed 11/25/2017 for the evaluation of potential abdominal aortic aneurysm.  Subsequent PET/CT performed 12/13/2017 confirmed the presence of an approximately 1.8 x 1.5 cm hypermetabolic right lower lobe pulmonary nodule.  No additional sites of abnormal hypermetabolic activity were identified within the chest, abdomen or pelvis nn the PET.  Patient declined referral to thoracic surgery for definitive surgical resection and has elected to pursue attempted CT-guided biopsy for tissue confirmation.  The patient is accompanied by her son-in-law though serves as her own historian.  Patient is without complaint.  Specifically, no cough, chest pain, fever or chills.  No unintentional weight loss.  No change in energy or appetite.   Past Medical History:  Diagnosis Date  . Depression   . Hyperlipidemia     Past Surgical History:  Procedure Laterality Date  . 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 retinal detatchment Right 05/2017  . TONSILLECTOMY    . TOTAL HIP ARTHROPLASTY Right 01/2015    Allergies: Other; Peanut oil; and Prochlorperazine  Medications: Prior to Admission medications   Medication Sig Start Date End Date Taking? Authorizing Provider  buPROPion (WELLBUTRIN SR) 150 MG 12 hr tablet Take 150 mg by mouth daily.    Yes [provider]  Loratadine (CLARITIN PO) Take 1 tablet by mouth daily.   Yes [provider]  naproxen sodium (ALEVE) 220 MG tablet Take 440 mg by mouth.    Yes [provider]  Omega-3 1000 MG CAPS Take by mouth.    Yes [provider]  Riboflavin 100 MG TABS  Take 2 tablets by mouth daily.   Yes [provider]  rosuvastatin (CRESTOR) 10 MG tablet TK 1 T PO QPM 11/18/17  Yes [provider]  EPINEPHrine (EPIPEN JR) 0.15 MG/0.3ML injection Inject into the muscle.    [provider]     Family History  Problem Relation Age of Onset  . Heart attack Mother   . Cancer Maternal Grandmother 80       spine ca    Social History   Socioeconomic History  . Marital status: Widowed    Spouse name: Not on file  . Number of children: Not on file  . Years of education: Not on file  . Highest education level: Not on file  Occupational History  . Not on file  Social Needs  . Financial resource strain: Not on file  . Food insecurity:    Worry: Not on file    Inability: Not on file  . Transportation needs:    Medical: Not on file    Non-medical: Not on file  Tobacco Use  . Smoking status: Current Every Day Smoker    Packs/day: 0.50    Years: 60.00    Pack years: 30.00    Types: Cigarettes  . Smokeless tobacco: Never Used  Substance and Sexual Activity  . Alcohol use: Not Currently  . Drug use: Never  . Sexual activity: Not on file  Lifestyle  . Physical activity:    Days per week: Not on file    Minutes per session: Not on file  . Stress: Not on file  Relationships  . Social connections:  Talks on phone: Not on file    Gets together: Not on file    Attends religious service: Not on file    Active member of club or organization: Not on file    Attends meetings of clubs or organizations: Not on file    Relationship status: Not on file  Other Topics Concern  . Not on file  Social History Narrative  . Not on file    ECOG Status: 0 - Asymptomatic  Review of Systems: A 12 point ROS discussed and pertinent positives are indicated in the HPI above.  All other systems are negative.  Review of Systems  Constitutional: Negative for activity change, fatigue and fever.  Respiratory: Negative.     Cardiovascular: Negative.     Vital Signs: BP (!) 141/95   Pulse 89   Temp 98.2 F (36.8 C) (Oral)   Resp 18   Ht 5\' 3"  (1.6 m)   Wt 59.4 kg   SpO2 96%   BMI 23.20 kg/m   Physical Exam  Constitutional: She appears well-developed and well-nourished.  HENT:  Head: Normocephalic.  Cardiovascular: Normal rate and regular rhythm.  Pulmonary/Chest: Effort normal and breath sounds normal.  Neurological: Abnormal reflex:    Skin: Skin is warm and dry.  Psychiatric: She has a normal mood and affect. Her behavior is normal.  Nursing note and vitals reviewed.   Imaging:  CTA abdomen pelvis - 11/25/2017; PET/CT -  12/13/2017  Labs:  CBC: Recent Labs    12/06/17 1049 01/13/18 0919  WBC 5.7 6.6  HGB 14.2 14.5  HCT 40.7 41.2  PLT 190 192    COAGS: Recent Labs    01/13/18 0919  INR 1.01    BMP: Recent Labs    12/06/17 1049 01/13/18 0919  NA 142 141  K 4.2 3.6  CL 110 109  CO2 26 25  GLUCOSE 96 104*  BUN 18 17  CALCIUM 9.1 8.6*  CREATININE 0.94 0.93  GFRNONAA 57* 58*  GFRAA >60 >60    LIVER FUNCTION TESTS: Recent Labs    12/06/17 1049  BILITOT 0.8  AST 22  ALT 14  ALKPHOS 76  PROT 6.4*  ALBUMIN 4.0    TUMOR MARKERS: No results for input(s): AFPTM, CEA, CA199, CHROMGRNA in the last 8760 hours.  Assessment and Plan:  Latoya Lynch is a 77 y.o. female with past medical history significant for depression and hyperlipidemia who was found to have an indeterminate right lower lobe pulmonary nodule on CT abdomen and pelvis performed 11/25/2017 for the evaluation of potential abdominal aortic aneurysm.  Subsequent PET/CT performed 12/13/2017 confirmed the presence of an approximately 1.8 x 1.5 cm hypermetabolic right lower lobe pulmonary nodule.  No additional sites of abnormal hypermetabolic activity were identified within the chest, abdomen or pelvis nn the PET.  Patient declined referral to thoracic surgery for definitive surgical resection  and has elected to pursue attempted CT-guided biopsy for tissue confirmation.  Risks and benefits of CT-guided right lower lobe pulmonary nodule biopsy was discussed with the patient including, but not limited to bleeding, hemoptysis, respiratory failure requiring intubation, infection, pneumothorax requiring chest tube placement, stroke from air embolism or even death.  All of the patient's questions were answered, patient is agreeable to proceed.  Consent signed and in chart.   Thank you for this interesting consult.  I greatly enjoyed meeting Oasis Goehring and look forward to participating in their care.  A copy of this report was sent to the requesting provider  on this date.  Electronically Signed: Sandi Mariscal, MD 01/13/2018, 10:08 AM   I spent a total of 15 Minutes in face to face in clinical consultation, greater than 50% of which was counseling/coordinating care for CT guided lung nodule biopsy.

## 2018-01-14 LAB — SURGICAL PATHOLOGY

## 2018-01-16 ENCOUNTER — Encounter: Payer: Self-pay | Admitting: Hematology and Oncology

## 2018-01-16 ENCOUNTER — Encounter: Payer: Self-pay | Admitting: *Deleted

## 2018-01-16 ENCOUNTER — Ambulatory Visit
Admission: RE | Admit: 2018-01-16 | Discharge: 2018-01-16 | Disposition: A | Discharge status: Home / Self Care | Payer: Medicare Other | Source: Ambulatory Visit | Attending: Radiation Oncology | Admitting: Radiation Oncology

## 2018-01-16 ENCOUNTER — Inpatient Hospital Stay: Payer: Medicare Other | Attending: Hematology and Oncology | Admitting: Hematology and Oncology

## 2018-01-16 VITALS — BP 120/77 | HR 88 | Temp 98.3°F | Resp 18 | Wt 132.2 lb

## 2018-01-16 DIAGNOSIS — F17210 Nicotine dependence, cigarettes, uncomplicated: Secondary | ICD-10-CM | POA: Insufficient documentation

## 2018-01-16 DIAGNOSIS — F329 Major depressive disorder, single episode, unspecified: Secondary | ICD-10-CM

## 2018-01-16 DIAGNOSIS — E785 Hyperlipidemia, unspecified: Secondary | ICD-10-CM | POA: Diagnosis not present

## 2018-01-16 DIAGNOSIS — Z79899 Other long term (current) drug therapy: Secondary | ICD-10-CM | POA: Insufficient documentation

## 2018-01-16 DIAGNOSIS — R918 Other nonspecific abnormal finding of lung field: Secondary | ICD-10-CM | POA: Diagnosis not present

## 2018-01-16 DIAGNOSIS — Z800 Family history of malignant neoplasm of digestive organs: Secondary | ICD-10-CM | POA: Diagnosis not present

## 2018-01-16 DIAGNOSIS — R911 Solitary pulmonary nodule: Secondary | ICD-10-CM

## 2018-01-16 NOTE — Progress Notes (Signed)
  Oncology Nurse Navigator Documentation  Navigator Location: [facility]Med Onc (01/16/18 1100)   )Navigator Encounter Type: Initial RadOnc;Diagnostic Results;Follow-up Appt (01/16/18 1100)                     Patient Visit Type: MedOnc;RadOnc (01/16/18 1100) Treatment Phase: Pre-Tx/Tx Discussion (01/16/18 1100) Barriers/Navigation Needs: Coordination of Care (01/16/18 1100)   Interventions: Coordination of Care (01/16/18 1100)   Coordination of Care: Appts;Radiology (01/16/18 1100)       met with patient and her daughter during follow up visit with Dr. Mike Gip to discuss results from recent biopsy. All questions answered during visit. Reviewed with pt MD recommendations to repeat CT scan in December to see if nodule is growing then per Dr. Baruch Gouty will go ahead and treat with SBRT. Reviewed upcoming appts with patient for CT scan and follow up with Dr. Mike Gip and Dr. Baruch Gouty a few days after scan. Informed pt to call with any further questions or needs. Pt verbalized understanding.      Time Spent with Patient: 90 (01/16/18 1100)

## 2018-01-16 NOTE — Consult Note (Signed)
NEW PATIENT EVALUATION  Name: Latoya Lynch  MRN: [id number]  Date:   01/16/2018     DOB: 1940/09/03   This 77 y.o. 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  REFERRING PHYSICIAN: Jodi Marble, MD  CHIEF COMPLAINT:  Chief Complaint  Patient presents with  . Lung Cancer    Initial consultation lung    DIAGNOSIS: The encounter diagnosis was Nodule of right lung.   PREVIOUS INVESTIGATIONS:  PET CT and CT scans reviewed Clinical notes reviewed Recent nondiagnostic pathology reviewed  HPI: patient is a 77 year old female status post prior hip surgery as well as surgery for a brain aneurysm who requested a CT scan at which time a density in the right lower lobe was noted. PET CT scan confirmed a hypermetabolic 1.7 x 1.5 cm mass in the right lower lobe concerning for primary lung neoplasm. No mediastinal or hilar adenopathy was noted. Case was presented at tumor conference patient had been seen by Dr. Faith Rogue although has declined surgery. She had attempted needle biopsy although tissue obtained was benign showing no evidence of malignancy. She is seen today for radiation oncology consultation. She specifically denies cough hemoptysis chest tightness or weight loss.on ulnar function tests her FEV1 and DLCO are between 1665%.  PLANNED TREATMENT REGIMEN: observe at this time possible SB RT  PAST MEDICAL HISTORY:  has a past medical history of Depression and Hyperlipidemia.    PAST SURGICAL HISTORY:  Past Surgical History:  Procedure Laterality Date  . 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 retinal detatchment Right 05/2017  . TONSILLECTOMY    . TOTAL HIP ARTHROPLASTY Right 01/2015    FAMILY HISTORY: family history includes Cancer (age of onset: 42) in her maternal grandmother; Heart attack in her mother.  SOCIAL HISTORY:  reports that she has been smoking cigarettes. She has a 30.00  pack-year smoking history. She has never used smokeless tobacco. She reports that she drank alcohol. She reports that she does not use drugs.  ALLERGIES: Other; Peanut oil; and Prochlorperazine  MEDICATIONS:  Current Outpatient Medications  Medication Sig Dispense Refill  . buPROPion (WELLBUTRIN SR) 150 MG 12 hr tablet Take 150 mg by mouth daily.     Marland Kitchen EPINEPHrine (EPIPEN JR) 0.15 MG/0.3ML injection Inject into the muscle.    . Loratadine (CLARITIN PO) Take 1 tablet by mouth daily.    . naproxen sodium (ALEVE) 220 MG tablet Take 440 mg by mouth.     . Omega-3 1000 MG CAPS Take by mouth.     . Riboflavin 100 MG TABS Take 2 tablets by mouth daily.    . rosuvastatin (CRESTOR) 10 MG tablet TK 1 T PO QPM  0   No current facility-administered medications for this encounter.     ECOG PERFORMANCE STATUS:  0 - Asymptomatic  REVIEW OF SYSTEMS:  Patient denies any weight loss, fatigue, weakness, fever, chills or night sweats. Patient denies any loss of vision, blurred vision. Patient denies any ringing  of the ears or hearing loss. No irregular heartbeat. Patient denies heart murmur or history of fainting. Patient denies any chest pain or pain radiating to her upper extremities. Patient denies any shortness of breath, difficulty breathing at night, cough or hemoptysis. Patient denies any swelling in the lower legs. Patient denies any nausea vomiting, vomiting of blood, or coffee ground material in the vomitus. Patient denies any stomach pain. Patient states has had normal bowel  movements no significant constipation or diarrhea. Patient denies any dysuria, hematuria or significant nocturia. Patient denies any problems walking, swelling in the joints or loss of balance. Patient denies any skin changes, loss of hair or loss of weight. Patient denies any excessive worrying or anxiety or significant depression. Patient denies any problems with insomnia. Patient denies excessive thirst, polyuria, polydipsia.  Patient denies any swollen glands, patient denies easy bruising or easy bleeding. Patient denies any recent infections, allergies or URI. Patient "s visual fields have not changed significantly in recent time.    PHYSICAL EXAM: There were no vitals taken for this visit. Well-developed well-nourished patient in NAD. HEENT reveals PERLA, EOMI, discs not visualized.  Oral cavity is clear. No oral mucosal lesions are identified. Neck is clear without evidence of cervical or supraclavicular adenopathy. Lungs are clear to A&P. Cardiac examination is essentially unremarkable with regular rate and rhythm without murmur rub or thrill. Abdomen is benign with no organomegaly or masses noted. Motor sensory and DTR levels are equal and symmetric in the upper and lower extremities. Cranial nerves II through XII are grossly intact. Proprioception is intact. No peripheral adenopathy or edema is identified. No motor or sensory levels are noted. Crude visual fields are within normal range.  LABORATORY DATA: recent negative pathology report reviewed    RADIOLOGY RESULTS:CT scan PET CT scan reviewed   IMPRESSION: probable stage I non-small cell lung cancer of the right lower lobe in 77 year old female  PLAN: at this time follow-up CT scan in December has been ordered. Should this lesion be progressing at that time I would offer SB RT 6000 cGy in 5 fractions. Risks and benefits of treatment to that area including possible cough fatigue skin reaction all were discussed with the patient and her daughter. I've set up a follow-up appointment shortly after her next CT scan. We'll coordinate her care and discussed the case personally with Dr. Mike Gip.Patient and daughter both seem to compress my treatment plan well. Patient is still aware of her surgical option although continues to decline surgery based on her previous experiences. I would like to take this opportunity to thank you for allowing me to participate in the care  of your patient.Noreene Filbert, MD

## 2018-01-16 NOTE — Progress Notes (Signed)
[facility] day:  01/16/2018  Chief Complaint: Latoya Lynch is a 77 y.o. female with a right lung mass who is seen for review of interval CT guided biopsy and discussion regarding direction of therapy.  HPI: The patient was last seen in the medical oncology clinic on 12/19/2017.  At that time, she noted an intermittent non-productive cough.  PET scan revealed a 1.7 cm hypermetabolic RLL lung lesion.  She was scheduled to see Dr. Genevive Bi.  She was seen in consultation by Dr. Genevive Bi on 12/20/2017.  Notes reviewed.  The nodule was felt to be malignant.   PFTs on 12/10/2017 revealed an FEV1 was 1.95 liters (62%).  DLCO was 15.77 ml/min/mmHg (62%).  There was discussion regarding obtaining old films from [place].  Surgery was discussed.  She was presented at tumor board on 01/02/2018.  She was not interested in surgery.  Decision was made to proceed with biopsy and radiation.  CT guided biopsy on 01/13/2018 revealed benign alveolated lung parenchyma with associated blood and organizing fibrin.  There was no evidence of tumor or granuloma.  During the interim, patient has been doing well overall following her biopsy. She experienced some post-procedural hemoptysis yesterday. She quantifies blood amount as being "about the size of a nickel". Intermittent cough persists. She denies experiencing shortness of breath. Patient denies that she has experienced any B symptoms. She denies any interval infections.   Patient advises that she maintains an adequate appetite. She is eating well. Weight today is 132 lb 3 oz (60 kg), which compared to her last visit to the clinic, represents a 1 pound increase.     Patient denies pain in the clinic today.   Past Medical History:  Diagnosis Date  . Depression   . Hyperlipidemia     Past Surgical History:  Procedure Laterality Date  . 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 retinal detatchment Right 05/2017  .  TONSILLECTOMY    . TOTAL HIP ARTHROPLASTY Right 01/2015    Family History  Problem Relation Age of Onset  . Heart attack Mother   . Cancer Maternal Grandmother 80       spine ca    Social History:  reports that she has been smoking cigarettes. She has a 30.00 pack-year smoking history. She has never used smokeless tobacco. She reports that she drank alcohol. She reports that she does not use drugs.  She has smoked 1/2 pack/day since age 42.  She denies any exposure to radiation or toxins.  Patient recently moved from [place] to [place].  Sh lives alone with 2 cats.  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  The patient is accompanied by her daughter, [name],  today.  Allergies:  Allergies  Allergen Reactions  . Other Anaphylaxis    All nuts  . Peanut Oil Anaphylaxis  . Prochlorperazine Anaphylaxis    Current Medications: Current Outpatient Medications  Medication Sig Dispense Refill  . buPROPion (WELLBUTRIN SR) 150 MG 12 hr tablet Take 150 mg by mouth daily.     Marland Kitchen EPINEPHrine (EPIPEN JR) 0.15 MG/0.3ML injection Inject into the muscle.    . Loratadine (CLARITIN PO) Take 1 tablet by mouth daily.    . naproxen sodium (ALEVE) 220 MG tablet Take 440 mg by mouth.     . Omega-3 1000 MG CAPS Take by mouth.     Marland Kitchen  Riboflavin 100 MG TABS Take 2 tablets by mouth daily.    . rosuvastatin (CRESTOR) 10 MG tablet TK 1 T PO QPM  0   No current facility-administered medications for this visit.     Review of Systems  Constitutional: Negative for diaphoresis, fever, malaise/fatigue and weight loss (up 1 pound).  HENT: Negative.   Eyes: Negative.        S/p RIGHT retinal detachment surgery in 05/2017  Respiratory: Positive for cough and hemoptysis (post-procedural). Negative for sputum production and shortness of breath.   Cardiovascular: Negative for chest pain,  palpitations, orthopnea, leg swelling and PND.  Gastrointestinal: Negative for abdominal pain, blood in stool, constipation, diarrhea, melena, nausea and vomiting.       Cologuard in 2018  Genitourinary: Negative for dysuria, frequency, hematuria and urgency.  Musculoskeletal: Positive for back pain and joint pain (RIGHT hip - s/p replacement in 2016). Negative for falls and myalgias.  Skin: Negative for itching and rash.       Nodule to LEFT wrist; plans to see dermatology  Neurological: Negative for dizziness, tremors, weakness and headaches.       Coordination/balance issues since brain aneurysm in 2004  Endo/Heme/Allergies: Does not bruise/bleed easily.  Psychiatric/Behavioral: Negative for depression, memory loss and suicidal ideas. The patient is not nervous/anxious and does not have insomnia.   All other systems reviewed and are negative.  Performance status (ECOG): 1 - Symptomatic but completely ambulatory  Vital Signs BP 120/77 (BP Location: Right Arm, Patient Position: Sitting)   Pulse 88   Temp 98.3 F (36.8 C) (Tympanic)   Resp 18   Wt 132 lb 3 oz (60 kg)   SpO2 96%   BMI 23.42 kg/m   Physical Exam  Constitutional: She is oriented to person, place, and time and well-developed, well-nourished, and in no distress. No distress.  HENT:  Head: Normocephalic and atraumatic.  Mouth/Throat: Oropharynx is clear and moist and mucous membranes are normal. No oropharyngeal exudate.  Short red hair.  Eyes: Pupils are equal, round, and reactive to light. Conjunctivae and EOM are normal. No scleral icterus.  Glasses.  Brown eyes.  Neck: Normal range of motion. Neck supple. No JVD present.  Cardiovascular: Normal rate, regular rhythm, normal heart sounds and intact distal pulses. Exam reveals no gallop and no friction rub.  No murmur heard. Pulmonary/Chest: Effort normal and breath sounds normal. No respiratory distress. She has no wheezes. She has no rales.  Abdominal: Soft. Bowel  sounds are normal. She exhibits no distension and no mass. There is no abdominal tenderness. There is no rebound and no guarding.  Musculoskeletal: Normal range of motion. She exhibits no edema or tenderness.  Lymphadenopathy:    She has no cervical adenopathy.    She has no axillary adenopathy.       Right: No inguinal and no supraclavicular adenopathy present.       Left: No inguinal and no supraclavicular adenopathy present.  Neurological: She is alert and oriented to person, place, and time.  Skin: Skin is warm and dry. Lesion (LEFT wrist) noted. No rash noted. She is not diaphoretic. No erythema.  Psychiatric: Mood, affect and judgment normal.  Nursing note and vitals reviewed.   No visits with results within 3 Day(s) from this visit.  Latest known visit with results is:  Hospital Outpatient Visit on 01/13/2018  Component Date Value Ref Range Status  . Sodium 01/13/2018 141  135 - 145 mmol/L Final  . Potassium 01/13/2018 3.6  3.5 -  5.1 mmol/L Final  . Chloride 01/13/2018 109  98 - 111 mmol/L Final  . CO2 01/13/2018 25  22 - 32 mmol/L Final  . Glucose, Bld 01/13/2018 104* 70 - 99 mg/dL Final  . BUN 01/13/2018 17  8 - 23 mg/dL Final  . Creatinine, Ser 01/13/2018 0.93  0.44 - 1.00 mg/dL Final  . Calcium 01/13/2018 8.6* 8.9 - 10.3 mg/dL Final  . GFR calc non Af Amer 01/13/2018 58* >60 mL/min Final  . GFR calc Af Amer 01/13/2018 >60  >60 mL/min Final   Comment: (NOTE) The eGFR has been calculated using the CKD EPI equation. This calculation has not been validated in all clinical situations. eGFR's persistently <60 mL/min signify possible Chronic Kidney Disease.   Georgiann Hahn gap 01/13/2018 7  5 - 15 Final   Performed at [facility], [address] [place], [place] [address]  . WBC 01/13/2018 6.6  4.0 - 10.5 K/uL Final  . RBC 01/13/2018 4.50  3.87 - 5.11 MIL/uL Final  . Hemoglobin 01/13/2018 14.5  12.0 - 15.0 g/dL Final  . HCT 01/13/2018 41.2  36.0 - 46.0 % Final  . MCV  01/13/2018 91.6  80.0 - 100.0 fL Final  . MCH 01/13/2018 32.2  26.0 - 34.0 pg Final  . MCHC 01/13/2018 35.2  30.0 - 36.0 g/dL Final  . RDW 01/13/2018 13.2  11.5 - 15.5 % Final  . Platelets 01/13/2018 192  150 - 400 K/uL Final  . nRBC 01/13/2018 0.0  0.0 - 0.2 % Final  . Neutrophils Relative % 01/13/2018 51  % Final  . Neutro Abs 01/13/2018 3.3  1.7 - 7.7 K/uL Final  . Lymphocytes Relative 01/13/2018 38  % Final  . Lymphs Abs 01/13/2018 2.5  0.7 - 4.0 K/uL Final  . Monocytes Relative 01/13/2018 7  % Final  . Monocytes Absolute 01/13/2018 0.5  0.1 - 1.0 K/uL Final  . Eosinophils Relative 01/13/2018 3  % Final  . Eosinophils Absolute 01/13/2018 0.2  0.0 - 0.5 K/uL Final  . Basophils Relative 01/13/2018 1  % Final  . Basophils Absolute 01/13/2018 0.1  0.0 - 0.1 K/uL Final  . Immature Granulocytes 01/13/2018 0  % Final  . Abs Immature Granulocytes 01/13/2018 0.02  0.00 - 0.07 K/uL Final   Performed at [facility], [address], [place], [place] [address]  . Prothrombin Time 01/13/2018 13.2  11.4 - 15.2 seconds Final  . INR 01/13/2018 1.01   Final   Performed at [facility], [address] [place], [place] [address]  . SURGICAL PATHOLOGY 01/13/2018    Final                   Value:Surgical Pathology CASE: [id number] PATIENT: Ervin Knack Surgical Pathology Report     SPECIMEN SUBMITTED: A. Lung, right lower lobe  CLINICAL HISTORY: Post CT guided biopsy of indeterminate hypermetabolic right lower lobe pulmonary nodule  PRE-OPERATIVE DIAGNOSIS: None provided  POST-OPERATIVE DIAGNOSIS: None provided.     DIAGNOSIS: A. LUNG, RIGHT LOWER LOBE; CT-GUIDED BIOPSY: - BENIGN ALVEOLATED LUNG PARENCHYMA WITH ASSOCIATED BLOOD, AND ORGANIZING FIBRIN. - NO EVIDENCE OF TUMOR OR GRANULOMA.  NOTE: Focal areas show blood and organizing fibrin along alveolar septae. Definite fibroblastic foci are not identified, and there is only minimal  inflammation.   GROSS DESCRIPTION: A. Labeled: Patient's name and medical record number, accompanying requisition stating source lung, post CT-guided biopsy of indeterminate hyper metabolic right lower lobe pulmonary nodule Received: In formalin Tissue  fragment(s): 2 Size: Aggregate, 0.6 x 0.1                          x 0.1 cm Description: Shaggy purple fragments of tissue/material Entirely submitted in one cassette.    Final Diagnosis performed by Allena Napoleon, MD.   Electronically signed 01/14/2018 11:27:43AM The electronic signature indicates that the named Attending Pathologist has evaluated the specimen  Technical component performed at [facility], [address], [place], [place] [address] Lab: [phone] Dir: Rush Farmer, MD, MMM  Professional component performed at [facility], [facility], [place], [place], [place] [address] Lab: [phone] Dir: Dellia Nims. Reuel Derby, MD     Assessment:  Makesha Belitz is a 77 y.o. female with spiculated right lung lesion found incidentally.  She has a 30 pack year smoking.  CT guided biopsy on 01/13/2018 revealed benign alveolated lung parenchyma with associated blood and organizing fibrin.  There was no evidence of tumor or granuloma.  Chest CT on 11/25/2017 at [facility] revealed a 1.7 x 1.1 x 1.3 cm spiculated mass at the right lung base suspicious for primary pulmonary neoplasm.  PET scan on 12/13/2017 revealed a 17 x 15 mm hypermetabolic right lower lobe lung lesion (SUV 5.17) concerning for primary lung neoplasm.  There was no mediastinal/hilar lymphadenopathy or evidence of metastatic disease involving the neck, chest, abdomen/pelvis or osseous structures.  PFTs on 12/10/2017 revealed an FEV1 of 1.95 liters (62%) and DLCO 15.77 ml/min/mmHg (62%).  Symptomatically, she continues to have a chronic cough. She has had a small amount of post-procedural hemoptysis. No shortness of breath.  No other acute concerns. Exam remains grossly unchanged from previous.   Plan: 1. RIGHT lung nodule  Review pathology from recent biopsy  Benign alveolated lung parenchyma  No evidence of tumor or granuloma  Discussed imaging with interventional radiologist.   Concerning for primary pulmonary neoplasm.  Differential also includes inflammation vs. infectious etiology.  Discuss concern for sampling error based on morphology of lesion, as well as metabolic activity (SUV 6.37) on recent PET imaging.   Discuss plans going forward  Patient adamant that she does not want surgical intervention.  If pulmonary neoplasm confirmed, patient electing to pursue XRT. Seeing radiation oncology in consult today.   Will obtain short term follow up CT imaging of the chest to assess for stability on 02/17/2018.  If area stable or has increased in size, discuss the need for a repeat tissue biopsy for clinical staging and diagnosis.  2. Cough and hemoptysis  Cough is chronic in nature, and likely related to underlying pathology.  Cough also likely exacerbated by recent pulmonary biopsy.   Hemoptysis is minimal. Discuss indications for further evaluation and treatment, which main include increased SOB, increase in amount and/or frequency of bleeding, and pain.   No interventions required at this time.  3. RTC after CT scan for MD assessment, review of imaging, and discussion regarding direction of therapy.   Honor Loh, NP  01/16/2018, 10:17 AM   I saw and evaluated the patient, participating in the key portions of the service and reviewing pertinent diagnostic studies and records.  I reviewed the nurse practitioner's note and agree with the findings and the plan.  The assessment and plan were discussed with the patient.  Several questions were asked by the patient and answered.   Nolon Stalls, MD 01/16/2018,10:17 AM

## 2018-01-16 NOTE — Progress Notes (Signed)
Pt in for biopsy results today.

## 2018-01-22 ENCOUNTER — Other Ambulatory Visit: Payer: Self-pay | Admitting: *Deleted

## 2018-01-22 DIAGNOSIS — R911 Solitary pulmonary nodule: Secondary | ICD-10-CM

## 2018-02-12 ENCOUNTER — Other Ambulatory Visit: Payer: Medicare Other

## 2018-02-17 ENCOUNTER — Ambulatory Visit
Admission: RE | Admit: 2018-02-17 | Discharge: 2018-02-17 | Disposition: A | Discharge status: Home / Self Care | Payer: Medicare Other | Source: Ambulatory Visit | Attending: Urgent Care | Admitting: Urgent Care

## 2018-02-17 DIAGNOSIS — R911 Solitary pulmonary nodule: Secondary | ICD-10-CM | POA: Diagnosis not present

## 2018-02-20 ENCOUNTER — Inpatient Hospital Stay: Payer: Medicare Other | Attending: Hematology and Oncology | Admitting: Hematology and Oncology

## 2018-02-20 ENCOUNTER — Other Ambulatory Visit: Payer: Self-pay

## 2018-02-20 ENCOUNTER — Encounter: Payer: Self-pay | Admitting: Radiation Oncology

## 2018-02-20 ENCOUNTER — Ambulatory Visit
Admission: RE | Admit: 2018-02-20 | Discharge: 2018-02-20 | Disposition: A | Discharge status: Home / Self Care | Payer: Medicare Other | Source: Ambulatory Visit | Attending: Radiation Oncology | Admitting: Radiation Oncology

## 2018-02-20 VITALS — BP 155/80 | HR 80 | Temp 96.8°F | Resp 18 | Wt 132.1 lb

## 2018-02-20 VITALS — BP 151/82 | HR 82 | Temp 98.2°F | Resp 17 | Wt 131.9 lb

## 2018-02-20 DIAGNOSIS — M549 Dorsalgia, unspecified: Secondary | ICD-10-CM | POA: Insufficient documentation

## 2018-02-20 DIAGNOSIS — M25551 Pain in right hip: Secondary | ICD-10-CM | POA: Diagnosis not present

## 2018-02-20 DIAGNOSIS — R911 Solitary pulmonary nodule: Secondary | ICD-10-CM | POA: Insufficient documentation

## 2018-02-20 DIAGNOSIS — I700 Atherosclerosis of aorta: Secondary | ICD-10-CM | POA: Insufficient documentation

## 2018-02-20 DIAGNOSIS — R05 Cough: Secondary | ICD-10-CM | POA: Insufficient documentation

## 2018-02-20 DIAGNOSIS — E785 Hyperlipidemia, unspecified: Secondary | ICD-10-CM | POA: Insufficient documentation

## 2018-02-20 DIAGNOSIS — Z8679 Personal history of other diseases of the circulatory system: Secondary | ICD-10-CM | POA: Diagnosis not present

## 2018-02-20 DIAGNOSIS — F17210 Nicotine dependence, cigarettes, uncomplicated: Secondary | ICD-10-CM | POA: Diagnosis not present

## 2018-02-20 DIAGNOSIS — Z79899 Other long term (current) drug therapy: Secondary | ICD-10-CM

## 2018-02-20 NOTE — Progress Notes (Signed)
Here for follow up. Saw Dr Donella Stade earlier today. (sees again in March/20 ) overall stated she feels well.

## 2018-02-20 NOTE — Progress Notes (Signed)
Radiation Oncology Follow up Note  Name: Latoya Lynch   Date:   02/20/2018 MRN:  [id number] DOB: 10/12/1940    This 77 y.o. 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  REFERRING PROVIDER: Tejan-Sie, S Ahmed, MD  HPI: patient is a 77-year-old female.who we're tracking for aright lower lobe abnormality. She has a PET positive hypermetabolic lesion 1.7 cm in greatest dimension the right lower lobe.she had a biopsy in early November showing benign ambulated lung parenchyma with no evidence of tumor or granuloma.she recent had a follow-up CT scan showing persistent lobulated right lower lobe pulmonary lesion. According to radiology this might be an AVM and they stated it might be amenable to TMB biopsy. No other lesions were noted. She specifically denies cough hemoptysis or chest tightness.  COMPLICATIONS OF TREATMENT: none  FOLLOW UP COMPLIANCE: keeps appointments   PHYSICAL EXAM:  BP (!) 151/82   Pulse 82   Temp 98.2 F (36.8 C)   Resp 17   Wt 131 lb 15.1 oz (59.8 kg)   BMI 23.37 kg/m  Well-developed well-nourished patient in NAD. HEENT reveals PERLA, EOMI, discs not visualized.  Oral cavity is clear. No oral mucosal lesions are identified. Neck is clear without evidence of cervical or supraclavicular adenopathy. Lungs are clear to A&P. Cardiac examination is essentially unremarkable with regular rate and rhythm without murmur rub or thrill. Abdomen is benign with no organomegaly or masses noted. Motor sensory and DTR levels are equal and symmetric in the upper and lower extremities. Cranial nerves II through XII are grossly intact. Proprioception is intact. No peripheral adenopathy or edema is identified. No motor or sensory levels are noted. Crude visual fields are within normal range.  RADIOLOGY RESULTS: PET CT and CT scans reviewed  PLAN: at this time I  discussed the case personally with Dr. Corcoran. We will refer her to pulmonology for possible endobronchial biopsy. Should this not prove to be feasible I would recommend a 3 to four-month follow-up with repeat CT scan to follow this lesion rather than to commence treatment at this time. Patient is well aware of our treatment plan and recommendations. We'll follow-up accordingly after referral to pulmonology I've tentatively set a 3 month follow-up with the patient. She knows to call at anytime with any concerns.  I would like to take this opportunity to thank you for allowing me to participate in the care of your patient..    Glenn Chrystal, MD   

## 2018-02-20 NOTE — Progress Notes (Signed)
[facility] day:  02/20/2018  Chief Complaint: Latoya Lynch is a 77 y.o. female with a right lung mass who is seen for review of interval CT guided biopsy and discussion regarding direction of therapy.  HPI: The patient was last seen in the medical oncology clinic on 01/16/2018.  At that time, she continued to have a chronic cough. She had a small amount of post-procedural hemoptysis.  She denied any shortness of breath. No other acute concerns. Exam remained grossly unchanged.   CT guided biopsy on 01/13/2018 had revealed benign lung parenchyma.  Concern was raised for a sampling error.  We discussed short term follow-up CT scan on 02/17/2018.  She saw Dr. Baruch Gouty on 01/16/2018.  Notes reviewed. 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  If lesion was progressing, she would be offered SBRT 6000 cGy in 5 fractions.  Chest CT on 02/17/2018 revealed persistent 1.55 x 1.25 cm lobulated right lower lobe pulmonary lesion with some surrounding scarring and atelectasis possibly related to the biopsy.  Although this may be an AVM, it was felt to possibly be a pulmonary neoplasm with sampling error (prior 1.7 x 1.5 cm on PET). Lesion should be amenable to ENB biopsy with a bronchus coursing right into the lesion.  There was no other pulmonary lesions and no enlarged mediastinal or hilar lymph nodes.  There was stable advanced atherosclerotic calcifications involving the aorta and coronary arteries.  Patient is scheduled to follow up with radiation oncology Baruch Gouty, MD) on 06/02/2018.  During the interm, patient has been doing well. She denies any acute symptoms. Cough has improved. She denies any episodes of hemoptysis. Pain in her back and RIGHT hip has improved unless she "goes to town in the garden". 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 Patient denies that she  has experienced any B symptoms. She denies any interval infections.   Patient has not been seen in consult by dermatology to evaluate nodule on her LEFT wrist. Patient states, "I really do want to get this taken care of, but I would like to wait until after the first of the year".   Patient advises that she maintains an adequate appetite. She is eating well. Weight today is 132 lb 1.6 oz (59.9 kg), which compared to her last visit to the clinic, represents a stable weight.   Patient denies pain in the clinic today.   Past Medical History:  Diagnosis Date  . Depression   . Hyperlipidemia     Past Surgical History:  Procedure Laterality Date  . 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 retinal detatchment Right 05/2017  . TONSILLECTOMY    . TOTAL HIP ARTHROPLASTY Right 01/2015    Family History  Problem Relation Age of Onset  . Heart attack Mother   . Cancer Maternal Grandmother 80       spine ca    Social History:  reports that she has been smoking cigarettes. She has a 30.00 pack-year smoking history. She has never used smokeless tobacco. She reports previous alcohol use. She reports that she does not use drugs.  She has smoked 1/2 pack/day since age 20.  She denies any exposure to radiation or toxins.  Patient recently moved from [place] to [place].  Sh lives alone with 2 cats.  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  The patient is accompanied by her daughter, Latoya Lynch,  today.  Allergies:  Allergies  Allergen Reactions  . Other Anaphylaxis    All nuts  . Peanut Oil Anaphylaxis  . Prochlorperazine Anaphylaxis    Current Medications: Current Outpatient Medications  Medication Sig Dispense Refill  . buPROPion (WELLBUTRIN SR) 150 MG 12 hr tablet Take 150 mg by mouth daily.     . Loratadine (CLARITIN PO) Take 1 tablet by mouth daily.    . naproxen  sodium (ALEVE) 220 MG tablet Take 440 mg by mouth.     . Omega-3 1000 MG CAPS Take by mouth.     . Riboflavin 100 MG TABS Take 2 tablets by mouth daily.    . rosuvastatin (CRESTOR) 10 MG tablet TK 1 T PO QPM  0  . EPINEPHrine (EPIPEN JR) 0.15 MG/0.3ML injection Inject into the muscle.     No current facility-administered medications for this visit.     Review of Systems  Constitutional: Negative.  Negative for chills, diaphoresis, fever, malaise/fatigue and weight loss (stable).  HENT: Negative.  Negative for congestion, ear discharge, ear pain, nosebleeds, sinus pain, sore throat and tinnitus.   Eyes: Negative.        S/p RIGHT retinal detachment surgery in 05/2017.  Respiratory: Positive for cough (improved). Negative for hemoptysis (resolved), sputum production, shortness of breath and wheezing.   Cardiovascular: Negative.  Negative for chest pain, palpitations, orthopnea, leg swelling and PND.  Gastrointestinal: Negative.  Negative for abdominal pain, blood in stool, constipation, melena, nausea and vomiting.       Cologuard in 2018.  Genitourinary: Negative.  Negative for dysuria, frequency, hematuria and urgency.  Musculoskeletal: Positive for back pain (if works in the garden) and joint pain (RIGHT hip - s/p replacement in 2016). Negative for falls, myalgias and neck pain.  Skin: Negative for itching and rash.       Nodule to LEFT wrist; plans to address after the first of the year.  Neurological: Negative for dizziness, tingling, tremors, sensory change, speech change, focal weakness, weakness and headaches.       Coordination/balance issues since brain aneurysm in 2004.  Endo/Heme/Allergies: Negative.  Does not bruise/bleed easily.  Psychiatric/Behavioral: Negative for depression, memory loss and suicidal ideas. The patient is not nervous/anxious and does not have insomnia.   All other systems reviewed and are negative.  Performance status (ECOG): 1  Vital Signs BP (!) 155/80  (BP Location: Left Arm, Patient Position: Sitting)   Pulse 80   Temp (!) 96.8 F (36 C) (Tympanic)   Resp 18   Wt 132 lb 1.6 oz (59.9 kg)   BMI 23.40 kg/m   Physical Exam  Constitutional: She is oriented to person, place, and time and well-developed, well-nourished, and in no distress. No distress.  HENT:  Head: Normocephalic and atraumatic.  Short red hair.  Eyes: Conjunctivae and EOM are normal. No scleral icterus.  Glasses.  Musculoskeletal: Normal range of motion.  Neurological: She is alert and oriented to person, place, and time. Gait normal.  Skin: She is not diaphoretic.  Psychiatric: Mood, affect and judgment normal.  Nursing note and vitals reviewed.   No visits with results within 3 Day(s) from this visit.  Latest known visit with results is:  Hospital Outpatient Visit on 01/13/2018  Component Date Value Ref Range Status  . Sodium 01/13/2018 141  135 - 145 mmol/L Final  . Potassium 01/13/2018 3.6  3.5 - 5.1 mmol/L Final  . Chloride 01/13/2018 109  98 - 111 mmol/L Final  . CO2 01/13/2018 25  22 - 32 mmol/L Final  . Glucose, Bld 01/13/2018 104* 70 - 99 mg/dL Final  . BUN 01/13/2018 17  8 - 23 mg/dL Final  . Creatinine, Ser 01/13/2018 0.93  0.44 - 1.00 mg/dL Final  . Calcium 01/13/2018 8.6* 8.9 - 10.3 mg/dL Final  . GFR calc non Af Amer 01/13/2018 58* >60 mL/min Final  . GFR calc Af Amer 01/13/2018 >60  >60 mL/min Final   Comment: (NOTE) The eGFR has been calculated using the CKD EPI equation. This calculation has not been validated in all clinical situations. eGFR's persistently <60 mL/min signify possible Chronic Kidney Disease.   Georgiann Hahn gap 01/13/2018 7  5 - 15 Final   Performed at [facility], [address] [place], [place] [address]  . WBC 01/13/2018 6.6  4.0 - 10.5 K/uL Final  . RBC 01/13/2018 4.50  3.87 - 5.11 MIL/uL Final  . Hemoglobin 01/13/2018 14.5  12.0 - 15.0 g/dL Final  . HCT 01/13/2018 41.2  36.0 - 46.0 % Final  . MCV 01/13/2018  91.6  80.0 - 100.0 fL Final  . MCH 01/13/2018 32.2  26.0 - 34.0 pg Final  . MCHC 01/13/2018 35.2  30.0 - 36.0 g/dL Final  . RDW 01/13/2018 13.2  11.5 - 15.5 % Final  . Platelets 01/13/2018 192  150 - 400 K/uL Final  . nRBC 01/13/2018 0.0  0.0 - 0.2 % Final  . Neutrophils Relative % 01/13/2018 51  % Final  . Neutro Abs 01/13/2018 3.3  1.7 - 7.7 K/uL Final  . Lymphocytes Relative 01/13/2018 38  % Final  . Lymphs Abs 01/13/2018 2.5  0.7 - 4.0 K/uL Final  . Monocytes Relative 01/13/2018 7  % Final  . Monocytes Absolute 01/13/2018 0.5  0.1 - 1.0 K/uL Final  . Eosinophils Relative 01/13/2018 3  % Final  . Eosinophils Absolute 01/13/2018 0.2  0.0 - 0.5 K/uL Final  . Basophils Relative 01/13/2018 1  % Final  . Basophils Absolute 01/13/2018 0.1  0.0 - 0.1 K/uL Final  . Immature Granulocytes 01/13/2018 0  % Final  . Abs Immature Granulocytes 01/13/2018 0.02  0.00 - 0.07 K/uL Final   Performed at [facility], [address] [place], [place] [address]  . Prothrombin Time 01/13/2018 13.2  11.4 - 15.2 seconds Final  . INR 01/13/2018 1.01   Final   Performed at [facility], [address] [place], [place] [address]  . SURGICAL PATHOLOGY 01/13/2018    Final                   Value:Surgical Pathology CASE: [id number] PATIENT: Ervin Knack Surgical Pathology Report     SPECIMEN SUBMITTED: A. Lung, right lower lobe  CLINICAL HISTORY: Post CT guided biopsy of indeterminate hypermetabolic right lower lobe pulmonary nodule  PRE-OPERATIVE DIAGNOSIS: None provided  POST-OPERATIVE DIAGNOSIS: None provided.     DIAGNOSIS: A. LUNG, RIGHT LOWER LOBE; CT-GUIDED BIOPSY: - BENIGN ALVEOLATED LUNG PARENCHYMA WITH ASSOCIATED BLOOD, AND ORGANIZING FIBRIN. - NO EVIDENCE OF TUMOR OR GRANULOMA.  NOTE: Focal areas show blood and organizing fibrin along alveolar septae. Definite fibroblastic foci are not identified, and there is only minimal  inflammation.   GROSS DESCRIPTION: A. Labeled: Patient's name and medical record number, accompanying requisition stating source lung, post CT-guided biopsy of indeterminate hyper metabolic right lower lobe pulmonary nodule Received: In  formalin Tissue fragment(s): 2 Size: Aggregate, 0.6 x 0.1                          x 0.1 cm Description: Shaggy purple fragments of tissue/material Entirely submitted in one cassette.    Final Diagnosis performed by Allena Napoleon, MD.   Electronically signed 01/14/2018 11:27:43AM The electronic signature indicates that the named Attending Pathologist has evaluated the specimen  Technical component performed at [facility], [address], [place], [place] [address] Lab: [phone] Dir: Rush Farmer, MD, MMM  Professional component performed at [facility], [facility], [place], [place], [place] [address] Lab: [phone] Dir: Dellia Nims. Reuel Derby, MD     Assessment:  Latoya Lynch is a 77 y.o. female with spiculated right lung lesion found incidentally.  She has a 30 pack year smoking.  CT guided biopsy on 01/13/2018 revealed benign alveolated lung parenchyma with associated blood and organizing fibrin.  There was no evidence of tumor or granuloma.  Chest CT on 11/25/2017 at [facility] revealed a 1.7 x 1.1 x 1.3 cm spiculated mass at the right lung base suspicious for primary pulmonary neoplasm.  PET scan on 12/13/2017 revealed a 17 x 15 mm hypermetabolic right lower lobe lung lesion (SUV 5.17) concerning for primary lung neoplasm.  There was no mediastinal/hilar lymphadenopathy or evidence of metastatic disease involving the neck, chest, abdomen/pelvis or osseous structures.  Chest CT on 02/17/2018 revealed persistent 1.55 x 1.25 cm lobulated right lower lobe pulmonary lesion with some surrounding scarring and atelectasis possibly related to the biopsy.  Although this may be an AVM, it was felt to  possibly be a pulmonary neoplasm with sampling error (prior 1.7 x 1.5 cm on PET).   PFTs on 12/10/2017 revealed an FEV1 of 1.95 liters (62%) and DLCO 15.77 ml/min/mmHg (62%).  Symptomatically, she denies any shortness of breath or hemoptysis.  Cough has improved.    Plan: 1. RIGHT lung nodule: Review pathology from biopsy on 01/13/2018. Benign alveolated lung parenchyma No evidence of tumor or granuloma Concern remains for primary pulmonary neoplasm. Differential includes inflammation/infectious etiology. Discuss concern for sampling error based on morphology of lesion, as well as metabolic activity (SUV 9.32) from PET scan on 12/13/2017.  Review interval chest CT on 02/17/2018. Lesion is persistent. Concern remains for primary pulmonary neoplasm and initial sampling error. Patient remains adamant that she does not want surgical intervention. Patient has seen radiation oncology with plans to radiate lesion if grows.  Per radiology, lesion may be amenable to ENB. Refer to Dr. Patsey Berthold, pulmonologist, regarding potential diagnostic procedure. 2.   Discuss transition of clinic to [place].  Patient wishes to remain in [place]. 3.   RTC after pulmonary consult for MD (Dr Janese Banks) assessment and discussion regarding direction of therapy.   Honor Loh, NP  02/20/2018, 3:45 PM   I saw and evaluated the patient, participating in the key portions of the service and reviewing pertinent diagnostic studies and records.  I reviewed the nurse practitioner's note and agree with the findings and the plan.  The assessment and plan were discussed with the patient.  Multiple questions were asked by the patient and answered.   Nolon Stalls, MD 02/20/2018,3:45 PM

## 2018-02-21 ENCOUNTER — Telehealth: Payer: Self-pay

## 2018-02-21 NOTE — Telephone Encounter (Signed)
LM on VM for patient to schedule apt with Dr. Patsey Berthold, referred by cancer center.

## 2018-02-25 ENCOUNTER — Telehealth: Payer: Self-pay

## 2018-02-25 NOTE — Telephone Encounter (Signed)
LM for patient to call to sch apt with Dr. Patsey Berthold.

## 2018-03-07 ENCOUNTER — Telehealth: Payer: Self-pay

## 2018-03-07 ENCOUNTER — Encounter: Payer: Self-pay | Admitting: Hematology and Oncology

## 2018-03-07 ENCOUNTER — Telehealth: Payer: Self-pay | Admitting: *Deleted

## 2018-03-07 NOTE — Telephone Encounter (Signed)
Called Latoya Lynch to let her know that she had never gotten an appointment with pulmonary to see about a biopsy and the appointment date is January 7 at 10 AM.  Patient states she is not going to that appointment and she does not feel that she needs another biopsy.  She feels like she is becoming a pin cushion.  She would like a second opinion I offered her a second opinion at [facility] or [facility] or to come and see Dr. Janese Banks who she was going to follow-up because of Dr. Mike Gip moving to the [employer].  She is agreeable to seeing Dr. Janese Banks.  She does not want to be seen next week because she has doctors appointments already scheduled as well as dealing with her grandkids next week.  She would like me to make her an appointment for the week after that and then call her with that appointment date and time.  I told her I will be speaking to Dr. Janese Banks about her tomorrow and call her and let her know about a future appointment

## 2018-03-07 NOTE — Telephone Encounter (Signed)
LM for patient to schedule apt with Dr. Patsey Berthold.

## 2018-03-11 ENCOUNTER — Institutional Professional Consult (permissible substitution): Payer: Medicare Other | Admitting: Internal Medicine

## 2018-03-12 ENCOUNTER — Telehealth: Payer: Self-pay | Admitting: *Deleted

## 2018-03-12 NOTE — Telephone Encounter (Signed)
I called patient last week to talk to her about making an appointment for a biopsy.  Patient had explained to me that it was recommended by Dr. Mike Gip but the first biopsy came back negative and she did not feel like she should go through another one.  She was already going to change to see another doctor since Dr. Mike Gip is moving to [place].  She chose Dr. Janese Banks.  Dr. Judithann Graves is willing to see her and talk to her and discuss results of biopsy different options that she may have and then let her decide whether or not she needs another biopsy.  Patient is agreeable to this.  Her appointment was given for her for January 17 at 930 to see Dr. Janese Banks and she is agreeable

## 2018-03-21 ENCOUNTER — Inpatient Hospital Stay: Payer: Medicare Other | Attending: Oncology | Admitting: Oncology

## 2018-03-21 ENCOUNTER — Telehealth: Payer: Self-pay | Admitting: *Deleted

## 2018-03-21 ENCOUNTER — Encounter: Payer: Self-pay | Admitting: Oncology

## 2018-03-21 ENCOUNTER — Other Ambulatory Visit: Payer: Self-pay | Admitting: *Deleted

## 2018-03-21 VITALS — BP 153/84 | HR 86 | Temp 98.1°F | Resp 19 | Wt 131.1 lb

## 2018-03-21 DIAGNOSIS — R911 Solitary pulmonary nodule: Secondary | ICD-10-CM

## 2018-03-21 DIAGNOSIS — F17210 Nicotine dependence, cigarettes, uncomplicated: Secondary | ICD-10-CM

## 2018-03-21 DIAGNOSIS — Z79899 Other long term (current) drug therapy: Secondary | ICD-10-CM | POA: Diagnosis not present

## 2018-03-21 DIAGNOSIS — R918 Other nonspecific abnormal finding of lung field: Secondary | ICD-10-CM

## 2018-03-21 NOTE — Progress Notes (Signed)
Hematology/Oncology Consult note [facility]  [phone] [phone]  Patient Care Team: Jodi Marble, MD as PCP - General (Internal Medicine) Telford Nab, RN as Registered Nurse   Name of the patient: Latoya Lynch  [id number]  1940-10-03   Date of visit: 03/21/18  Diagnosis-lung nodule  Chief complaint/ Reason for visit-routine follow-up of lung nodule  Heme/Onc history: Patient is a 78 year old female with a 30-pack-year smoking.  She underwent CT chest in September 2019 which showed 1.7 x 1.1 x 1.3 cm spiculated mass at the right lung base suspicious for primary lung cancer.  PET scan in October 2019 showed hypermetabolism with an SUV of 5 in that area.  No other hypermetabolic them elsewhere.  Patient then had a CT-guided biopsy which did not reveal any malignancy.  It showed benign alveolar related lung parenchyma with associated blood and organizing fibrin.  She also met with Dr. Genevive Bi but did not wish to proceed with definitive surgery for her right lower lobe lung nodule.  Case was discussed at tumor board and plan was to get a repeat bronchoscopy guided biopsy.  Interval history-patient did not go ahead with pulmonary appointment as she was not sure which way she wanted to proceed and wanted to discuss everything all over again.  Patient still does not wish to proceed with any surgery.  Overall she feels well and she denies any pain shortness of breath.  Her appetite is good and her weight is stable.  She has some chronic back pain and hip pain.  ECOG PS- 1 Pain scale- 2   Review of systems- Review of Systems  Constitutional: Negative for chills, fever, malaise/fatigue and weight loss.  HENT: Negative for congestion, ear discharge and nosebleeds.   Eyes: Negative for blurred vision.  Respiratory: Negative for cough, hemoptysis, sputum production, shortness of breath and wheezing.   Cardiovascular: Negative for  chest pain, palpitations, orthopnea and claudication.  Gastrointestinal: Negative for abdominal pain, blood in stool, constipation, diarrhea, heartburn, melena, nausea and vomiting.  Genitourinary: Negative for dysuria, flank pain, frequency, hematuria and urgency.  Musculoskeletal: Positive for back pain and joint pain. Negative for myalgias.  Skin: Negative for rash.  Neurological: Negative for dizziness, tingling, focal weakness, seizures, weakness and headaches.  Endo/Heme/Allergies: Does not bruise/bleed easily.  Psychiatric/Behavioral: Negative for depression and suicidal ideas. The patient does not have insomnia.        Allergies  Allergen Reactions  . Other Anaphylaxis    All nuts  . Peanut Oil Anaphylaxis  . Prochlorperazine Anaphylaxis     Past Medical History:  Diagnosis Date  . Depression   . Hyperlipidemia      Past Surgical History:  Procedure Laterality Date  . 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 retinal detatchment Right 05/2017  . TONSILLECTOMY    . TOTAL HIP ARTHROPLASTY Right 01/2015    Social History   Socioeconomic History  . Marital status: Widowed    Spouse name: Not on file  . Number of children: Not on file  . Years of education: Not on file  . Highest education level: Not on file  Occupational History  . Not on file  Social Needs  . Financial resource strain: Not on file  . Food insecurity:    Worry: Not on file    Inability: Not on file  . Transportation needs:    Medical: Not on file    Non-medical: Not on file  Tobacco Use  . Smoking  status: Current Every Day Smoker    Packs/day: 0.50    Years: 60.00    Pack years: 30.00    Types: Cigarettes  . Smokeless tobacco: Never Used  Substance and Sexual Activity  . Alcohol use: Not Currently  . Drug use: Never  . Sexual activity: Not on file  Lifestyle  . Physical activity:    Days per week: Not on file    Minutes per session: Not on file  . Stress: Not on file  Relationships   . Social connections:    Talks on phone: Not on file    Gets together: Not on file    Attends religious service: Not on file    Active member of club or organization: Not on file    Attends meetings of clubs or organizations: Not on file    Relationship status: Not on file  . Intimate partner violence:    Fear of current or ex partner: Not on file    Emotionally abused: Not on file    Physically abused: Not on file    Forced sexual activity: Not on file  Other Topics Concern  . Not on file  Social History Narrative  . Not on file    Family History  Problem Relation Age of Onset  . Heart attack Mother   . Cancer Maternal Grandmother 80       spine ca     Current Outpatient Medications:  .  buPROPion (WELLBUTRIN SR) 150 MG 12 hr tablet, Take 150 mg by mouth daily. , Disp: , Rfl:  .  EPINEPHrine (EPIPEN JR) 0.15 MG/0.3ML injection, Inject into the muscle., Disp: , Rfl:  .  Loratadine (CLARITIN PO), Take 1 tablet by mouth daily., Disp: , Rfl:  .  naproxen sodium (ALEVE) 220 MG tablet, Take 440 mg by mouth. , Disp: , Rfl:  .  Omega-3 1000 MG CAPS, Take by mouth. , Disp: , Rfl:  .  Riboflavin 100 MG TABS, Take 2 tablets by mouth daily., Disp: , Rfl:  .  rosuvastatin (CRESTOR) 10 MG tablet, TK 1 T PO QPM, Disp: , Rfl: 0  Physical exam:  Vitals:   03/21/18 0941  BP: (!) 153/84  Pulse: 86  Resp: 19  Temp: 98.1 F (36.7 C)  TempSrc: Tympanic  Weight: 131 lb 1.6 oz (59.5 kg)   Physical Exam HENT:     Head: Normocephalic and atraumatic.  Eyes:     Pupils: Pupils are equal, round, and reactive to light.  Neck:     Musculoskeletal: Normal range of motion.  Cardiovascular:     Rate and Rhythm: Normal rate and regular rhythm.     Heart sounds: Normal heart sounds.  Pulmonary:     Effort: Pulmonary effort is normal.     Breath sounds: Normal breath sounds.  Abdominal:     General: Bowel sounds are normal.     Palpations: Abdomen is soft.  Skin:    General: Skin is warm  and dry.  Neurological:     Mental Status: She is alert and oriented to person, place, and time.      CMP Latest Ref Rng & Units 01/13/2018  Glucose 70 - 99 mg/dL 104(H)  BUN 8 - 23 mg/dL 17  Creatinine 0.44 - 1.00 mg/dL 0.93  Sodium 135 - 145 mmol/L 141  Potassium 3.5 - 5.1 mmol/L 3.6  Chloride 98 - 111 mmol/L 109  CO2 22 - 32 mmol/L 25  Calcium 8.9 - 10.3 mg/dL 8.6(L)  Total Protein 6.5 - 8.1 g/dL -  Total Bilirubin 0.3 - 1.2 mg/dL -  Alkaline Phos 38 - 126 U/L -  AST 15 - 41 U/L -  ALT 0 - 44 U/L -   CBC Latest Ref Rng & Units 01/13/2018  WBC 4.0 - 10.5 K/uL 6.6  Hemoglobin 12.0 - 15.0 g/dL 14.5  Hematocrit 36.0 - 46.0 % 41.2  Platelets 150 - 400 K/uL 192      Assessment and plan- Patient is a 78 y.o. female with a spiculated right lower lobe lung nodule about 1.7 cm in size concerning for lung cancer  I reviewed patient's PET/CT scan images independently and discussed findings with the patient.  I explained to her that a spiculated lung mass along with findings of hypermetabolism on PET scan is concerning for lung cancer.  She did undergo a repeat CT scan in December 2019 which did not reveal any further growth of this lung nodule.  Discussed that unless we do a repeat biopsy we would not be able to a certain the etiology of this lung nodule.  Her initial biopsy did not show malignancy but that could have been a sampling error.  The alternative would be to wait and watch and get a repeat CT scan in 3 months time.  If we do that, then the uncertainty of the diagnosis still continues and there is a potential further nodule to grow.  Patient understands her possible options at this time including proceeding with a bronchoscopy versus wait and watch and a repeat CT scan in 3 months.  Patient would like to see pulmonary at this time but she still unsure if she would like to pursue a biopsy right away.  Patient is also met with Dr. Donella Stade from radiation oncology and it is unclear if  he would be comfortable radiating this lesion especially if it does not show significant growth in the absence of a confirmed tissue diagnosis.  There would be no role for chemotherapy at this time   Total face to face encounter time for this patient visit was 30 min. >50% of the time was  spent in counseling and coordination of care.     Visit Diagnosis 1. Nodule of right lung      Dr. Randa Evens, MD, MPH [facility] [phone] 03/21/2018 9:44 AM

## 2018-03-21 NOTE — Progress Notes (Signed)
mdt

## 2018-03-21 NOTE — Progress Notes (Signed)
Patient having some Left hip pain. Has had a Right hip replacement.Chronic cough with clear sputum.Marland Kitchen Active smoker. Appetite is lower. Stress of all these doctor visits and her sick cat at home is making sleeping difficult for lately.

## 2018-03-21 NOTE — Telephone Encounter (Signed)
Patient to let him know that I have made her an appointment for pulmonary with Dr. Patsey Berthold on January 23 at 10 AM/10 agreeable to plan and notes directions to get to their office

## 2018-03-27 ENCOUNTER — Ambulatory Visit: Payer: Medicare Other | Admitting: Pulmonary Disease

## 2018-03-27 ENCOUNTER — Encounter: Payer: Self-pay | Admitting: Pulmonary Disease

## 2018-03-27 VITALS — BP 150/82 | HR 92 | Ht 63.0 in | Wt 131.8 lb

## 2018-03-27 DIAGNOSIS — R918 Other nonspecific abnormal finding of lung field: Secondary | ICD-10-CM

## 2018-03-27 DIAGNOSIS — J449 Chronic obstructive pulmonary disease, unspecified: Secondary | ICD-10-CM | POA: Diagnosis not present

## 2018-03-27 DIAGNOSIS — F17210 Nicotine dependence, cigarettes, uncomplicated: Secondary | ICD-10-CM | POA: Diagnosis not present

## 2018-03-27 DIAGNOSIS — Q2730 Arteriovenous malformation, site unspecified: Secondary | ICD-10-CM

## 2018-03-27 MED ORDER — UMECLIDINIUM-VILANTEROL 62.5-25 MCG/INH IN AEPB: 1.0000 | INHALATION_SPRAY | Freq: Every day | RESPIRATORY_TRACT | Status: DC

## 2018-03-27 MED ORDER — UMECLIDINIUM-VILANTEROL 62.5-25 MCG/INH IN AEPB: 1.0000 | INHALATION_SPRAY | Freq: Every day | RESPIRATORY_TRACT | 0 refills | Status: DC

## 2018-03-27 MED ORDER — UMECLIDINIUM-VILANTEROL 62.5-25 MCG/INH IN AEPB: 1.0000 | INHALATION_SPRAY | Freq: Every day | RESPIRATORY_TRACT | 6 refills | Status: DC

## 2018-03-27 NOTE — Patient Instructions (Signed)
1.  You will be scheduled for an echocardiogram (heart ultrasound) with bubble study to look for a potential shunt of blood that would give [place] an indication about the spot in your lung.  This could be related to what we call an AVM (arteriovenous malformation).  2.  You will be scheduled for a CT with PE study (special dye given during the CT) to look characteristics of AVM on the spot in your lung.  3.  Do not recommend biopsy until these studies are done due to the high likelihood of bleeding during the biopsy.  4.  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  Recommend discontinuation of smoking.  We will give you a trial of Anoro Ellipta 1 inhalation daily to see if this helps with your shortness of breath during activity.  4.  We will see you in follow-up after these tests are completed.  5.  We will see you in follow-up in 2 to 3 weeks time.

## 2018-03-27 NOTE — Progress Notes (Signed)
Subjective:    Patient ID: Latoya Lynch, female    DOB: 1940/09/28, 78 y.o.   MRN: [id number]  HPI Patient is a 78 year old current smoker (half a pack of cigarettes per day) who presents for evaluation of a right lower lobe mass.  She is kindly referred by Dr. Randa Evens.  She underwent a CT chest in September 2019 for evaluation of potential aortic aneurysm.  The patient was concerned because she has a history of prior cerebral aneurysm and she was concerned because of a strong family history for this.  She underwent cerebral aneurysm repair in November 2004 in [place].  Prior to September 2019 she had not had chest imaging in the form of CT that she can remember.  Subsequently after her imaging of September she was noted to have a lobulated mass on the right lower lobe measuring 1.7 x 1.5 cm.  She then underwent PET/CT on 13 December 2017 which showed mild hypermetabolic activity on this right lower lobe mass.  Of note the mass was described as being spiculated however I disagree this is more of a lobulated mass.  I have reviewed the films independently.  Patient also shows evidence of emphysema and mild pulmonary scarring.  The patient then underwent CT-guided biopsy of said mass on 13 January 2018.  The description of the biopsy shows that the mass bled significantly during the procedure.  The patient also describes bright red hemoptysis for approximately 3 days after the procedure.  The biopsy was nondiagnostic.  Sequently the patient had another CT chest on 16 December this time the mass was described as being lobulated and and it appeared to have pulmonary artery and vein coming into the lesion with a bronchus coursing through it.  This is highly suspicious for a potential AVM.  These lesions can be hypermetabolic on PET/CT.  I have reviewed this CT independently.  The patient describes some dyspnea on exertion which she has had over several years.  She did undergo a pulmonary  function test on 8 October that showed findings consistent with moderate COPD.  She had been evaluated by Dr. Faith Rogue on 18 October.  She has been reluctant to undergo thoracotomy for excision of the lesion.  As noted above her CT-guided biopsy was nondiagnostic, this was performed on 13 January 2018.  The biopsy showed blood and organizing fibrin no evidence of tumor or granuloma.  Patient has not had any orthopnea or paroxysmal nocturnal dyspnea.  No lower extremity edema.  No tachypalpitations.  She has had no cough.  Other than the 3 days of hemoptysis that she had post biopsy he has not had any other instance of hemoptysis.  Past medical history, surgical history and family history have been reviewed.  There is a strong family history of arteriovenous malformations in the family particularly cerebral AVMs.  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  The patient has been a smoker since age 40.  She smoked a pack of cigarettes per day but is currently smoking a half a pack of cigarettes per day.  She is a retired Agricultural engineer.  Currently she enjoys painting with water colors.  She does not have significant occupational exposure.  Prior to moving here in [place] she has resided in [place].  She lived in the [place] area which is one of the endemic areas for "valley fever" (coccidiomycosis).  Review of Systems  Constitutional: Negative.   HENT: Negative.   Eyes: Negative.  Respiratory: Positive for shortness of breath (Exertion). Negative for cough, chest tightness and wheezing.   Cardiovascular: Negative.   Gastrointestinal: Negative.   Endocrine: Negative.   Genitourinary: Negative.   Musculoskeletal: Negative.   Skin: Negative.   Allergic/Immunologic: Negative.   Neurological: Negative.   Hematological: Negative.   Psychiatric/Behavioral: Negative.   All other systems reviewed and are negative.     Objective:   Physical Exam Vitals signs (Blood  pressure slightly elevated, patient nervous) reviewed.  Constitutional:      Appearance: She is well-developed and normal weight.  HENT:     Head: Normocephalic and atraumatic.     Mouth/Throat:     Mouth: Mucous membranes are moist.     Pharynx: Oropharynx is clear. No oropharyngeal exudate.  Eyes:     Extraocular Movements: Extraocular movements intact.     Pupils: Pupils are equal, round, and reactive to light.  Neck:     Musculoskeletal: Normal range of motion.     Thyroid: No thyromegaly.     Vascular: No JVD.     Trachea: No tracheal deviation.  Cardiovascular:     Rate and Rhythm: Normal rate and regular rhythm.     Pulses: Normal pulses.     Heart sounds: Normal heart sounds.  Pulmonary:     Effort: Pulmonary effort is normal. No respiratory distress.     Comments: Coarse breath sounds. Chest:     Chest wall: No tenderness.  Abdominal:     General: Bowel sounds are normal.     Palpations: Abdomen is soft.     Comments: No distention.  Musculoskeletal: Normal range of motion.     Right lower leg: No edema.     Left lower leg: No edema.  Lymphadenopathy:     Cervical: No cervical adenopathy.  Skin:    General: Skin is warm and dry.  Neurological:     General: No focal deficit present.     Mental Status: She is alert and oriented to person, place, and time.  Psychiatric:        Mood and Affect: Mood normal.        Behavior: Behavior normal.        Assessment & Plan:  1.  Mass of the lower lobe of the right lung: This is a lobulated mass, it is FDG avid but has low avidity.  The differential diagnoses include low growing tumor, however the biopsy was negative though this could have represented sampling error.  The mass appeared to be very bloody and has characteristics that could represent an AVM in the lung.  This would be the second consideration in the differential diagnosis.  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  The work-up will be as noted below.  2.  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  She herself had a cerebral AVM clipped previously in 2004.  Will obtain 2D echo with bubble study to evaluate for potential right to left shunt (intrapulmonary shunt) and will also obtain CT scan of the chest with PE protocol to evaluate for this possibility.  If the CT scan of the chest is negative for AVM will recommend navigational bronchoscopy for evaluation of the mass.  If navigational bronchoscopy with biopsy is necessary the patient also need cultures of the mass to exclude coccidiomycosis.  We will have radiology provide  a CD with the DICOM data of the CT scheduled for 3 February.  It is imperative to exclude an AVM as biopsying this would place the patient at risk of potential exsanguination if biopsied.  If it appears to be an AVM would recommend thoracotomy for excision of the same.  3.  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  The patient was taught the proper use of the inhaler device.  4.  Tobacco dependence due to cigarettes: Patient was counseled with regards to discontinuation of smoking total counseling time 3 to 5 minutes.   Thank you for allowing me to participate in this patient's care.  We will see the patient in follow-up after the above studies are performed.

## 2018-04-07 ENCOUNTER — Ambulatory Visit
Admission: RE | Admit: 2018-04-07 | Discharge: 2018-04-07 | Disposition: A | Discharge status: Home / Self Care | Payer: Medicare Other | Source: Ambulatory Visit | Attending: Pulmonary Disease | Admitting: Pulmonary Disease

## 2018-04-07 DIAGNOSIS — Q2730 Arteriovenous malformation, site unspecified: Secondary | ICD-10-CM | POA: Diagnosis not present

## 2018-04-07 LAB — POCT I-STAT CREATININE: Creatinine, Ser: 1 mg/dL (ref 0.44–1.00)

## 2018-04-07 MED ORDER — IOHEXOL 350 MG/ML SOLN
75.0000 mL | Freq: Once | INTRAVENOUS | Status: AC | PRN
  Administered 2018-04-07: 75 mL via INTRAVENOUS

## 2018-04-09 ENCOUNTER — Ambulatory Visit
Admission: RE | Admit: 2018-04-09 | Discharge: 2018-04-09 | Disposition: A | Discharge status: Home / Self Care | Payer: Medicare Other | Source: Ambulatory Visit | Attending: Pulmonary Disease | Admitting: Pulmonary Disease

## 2018-04-09 DIAGNOSIS — F17210 Nicotine dependence, cigarettes, uncomplicated: Secondary | ICD-10-CM | POA: Insufficient documentation

## 2018-04-09 DIAGNOSIS — E785 Hyperlipidemia, unspecified: Secondary | ICD-10-CM | POA: Diagnosis not present

## 2018-04-09 DIAGNOSIS — Q2730 Arteriovenous malformation, site unspecified: Secondary | ICD-10-CM | POA: Diagnosis not present

## 2018-04-09 DIAGNOSIS — I351 Nonrheumatic aortic (valve) insufficiency: Secondary | ICD-10-CM | POA: Diagnosis not present

## 2018-04-09 NOTE — Progress Notes (Signed)
*  PRELIMINARY RESULTS* Echocardiogram 2D Echocardiogram has been performed.  Latoya Lynch 04/09/2018, 10:51 AM

## 2018-04-10 ENCOUNTER — Telehealth: Payer: Self-pay | Admitting: Pulmonary Disease

## 2018-04-10 NOTE — Telephone Encounter (Signed)
I have contacted patient and let her know results. Wants to discuss further prior to bx. We are scheduling F/U.

## 2018-04-16 NOTE — Telephone Encounter (Signed)
Will route this over to Arlee Muslim, Therapist, sports.

## 2018-04-16 NOTE — Telephone Encounter (Signed)
This has been completed.

## 2018-04-23 ENCOUNTER — Encounter: Payer: Self-pay | Admitting: Pulmonary Disease

## 2018-04-23 ENCOUNTER — Ambulatory Visit: Payer: Medicare Other | Admitting: Pulmonary Disease

## 2018-04-23 VITALS — BP 138/78 | HR 87 | Ht 63.0 in | Wt 131.2 lb

## 2018-04-23 DIAGNOSIS — F17210 Nicotine dependence, cigarettes, uncomplicated: Secondary | ICD-10-CM

## 2018-04-23 DIAGNOSIS — J449 Chronic obstructive pulmonary disease, unspecified: Secondary | ICD-10-CM

## 2018-04-23 DIAGNOSIS — R918 Other nonspecific abnormal finding of lung field: Secondary | ICD-10-CM

## 2018-04-23 NOTE — Progress Notes (Signed)
Subjective:    Patient ID: Latoya Lynch, female    DOB: December 18, 1940, 78 y.o.   MRN: [id number]  HPI Patient is a 78 year old current smoker (half a pack of cigarettes per day) presents for follow-up of a right lower lobe mass.  Mass was found in September 2019 during a CT for evaluation of potential aortic aneurysm.  The mass was studied with PET CT on 13 December 2017 that showed mild hypermetabolic activity.  She underwent biopsy of the mass via percutaneous means on 13 January 2018.  The mass bled significantly during the procedure.  Patient also described hemoptysis after the procedure.  Per radiology, the question of whether this was an AVM arose.  We initially saw her on 27 March 2018.  She was referred for potential navigational bronchoscopy.  She was reluctant to undergo another procedure however was willing to study the mass more carefully to exclude potential AVM.  She has had a PE protocol CT chest that showed no evidence of the mass being an AVM.  Of note the mass has been noted to increase in size 2 mm since the first CT in September.  Patient also underwent 2D echo with bubble contrast and this was not consistent with an intrapulmonary shunt.  Therefore it is likely that the mass represents a neoplasm.  This was discussed with the patient today.  She is still somewhat reluctant to undergo another procedure but if she is to have another procedure she prefers to have a biopsy rather than VATS or lobectomy.  At her initial visit we gave the patient a trial of Anoro Ellipta for management of COPD.  She notes that with this medication her cough has decreased significantly.  He unfortunately continues to smoke half a pack of cigarettes per day.  She voices no other complaint.  She has not had any fevers, chills or sweats.  No weight loss or anorexia.    Review of Systems  Constitutional: Negative.   HENT: Negative.   Respiratory: Positive for cough.   Cardiovascular: Negative.     Gastrointestinal: Negative.   Genitourinary: Negative.   Musculoskeletal: Negative.   Skin: Negative.   Neurological: Negative.   Psychiatric/Behavioral: Negative.   All other systems reviewed and are negative.      Objective:   Physical Exam Constitutional:      General: She is not in acute distress.    Appearance: Normal appearance. She is normal weight. She is not ill-appearing.  HENT:     Head: Normocephalic and atraumatic.     Right Ear: External ear normal.     Left Ear: External ear normal.     Nose: Nose normal.     Mouth/Throat:     Mouth: Mucous membranes are moist.     Pharynx: Oropharynx is clear.  Eyes:     General: No scleral icterus.    Conjunctiva/sclera: Conjunctivae normal.     Pupils: Pupils are equal, round, and reactive to light.  Neck:     Musculoskeletal: Neck supple. No crepitus.     Thyroid: No thyromegaly.     Vascular: No JVD.     Trachea: Trachea and phonation normal.  Cardiovascular:     Rate and Rhythm: Normal rate and regular rhythm.     Pulses: Normal pulses.     Heart sounds: Normal heart sounds.  Pulmonary:     Effort: Pulmonary effort is normal.     Breath sounds: Normal breath sounds.  Abdominal:     General:  Abdomen is flat. There is no distension.  Musculoskeletal: Normal range of motion.     Right lower leg: No edema.     Left lower leg: No edema.  Lymphadenopathy:     Cervical: No cervical adenopathy.  Skin:    General: Skin is warm and dry.     Findings: No rash.  Neurological:     General: No focal deficit present.     Mental Status: She is alert and oriented to person, place, and time.  Psychiatric:        Mood and Affect: Mood normal.        Behavior: Behavior normal.           Assessment & Plan:  1.  Mass of the lower lobe of the right lung: This is a lobulated mass, it is FDG avid but has low avidity.  The differential diagnoses include low growing tumor, and AVM has been ruled out by PE protocol CT and  evidence of no intrapulmonary shunting on echocardiogram.  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    I have recommended the patient undergo navigational bronchoscopy with biopsy.  She remains somewhat reluctant to have this.  We had scheduled initially for 28 February but states that she cannot do it that day.  She then stated that she cannot do it till the end of March.  She has a follow-up with Dr. Janese Banks towards the end of March with another CT chest.  I will make sure that those images get sent to a CD so that we can make a scan for super dimension planning as she is postponing for a month out.  We will need to see her to schedule the procedure.  I will be on leave from 12 March until 20 March.  We will schedule the procedure after my return per her request.  2.  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  3.  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 Continue the same.  4.  Tobacco dependence due to cigarettes: Patient was counseled with regards to discontinuation of smoking total counseling time 3 to 5 minutes.  This counseling was a reinforcement on her prior counseling.

## 2018-04-23 NOTE — Patient Instructions (Addendum)
1. You will be scheduled for a lung biopsy. This will be done under general anesthesia.

## 2018-04-24 ENCOUNTER — Telehealth: Payer: Self-pay

## 2018-04-24 NOTE — Telephone Encounter (Signed)
04/24/2018 at 3:22 pm CST spoke with Latoya Lynch at [facility]. Procedure code [phone] is a valid and billable code which does not require PA. Call Ref # [id number] :22pmCST. [name]

## 2018-04-24 NOTE — Telephone Encounter (Signed)
Pt called back. March 27th may be a good day for her.  CB # [phone]

## 2018-04-24 NOTE — Telephone Encounter (Signed)
Spoke to patient to schedule bronchoscopy.  Dx: Right lower lobe mass Proc: ENB CPT: 62376 Date: 05/30/18 @1300 

## 2018-04-24 NOTE — Telephone Encounter (Signed)
LM on VM for patient to call re: bronchoscopy. Looking at the week of March 23.

## 2018-04-25 ENCOUNTER — Telehealth: Payer: Self-pay

## 2018-04-25 NOTE — Telephone Encounter (Signed)
Spoke to patient, she is aware of all dates and times for bronchoscopy.

## 2018-05-14 ENCOUNTER — Telehealth: Payer: Self-pay | Admitting: Pulmonary Disease

## 2018-05-14 NOTE — Telephone Encounter (Signed)
Message routed to [facility] triage to follow up. Patient sees Dr Patsey Berthold.

## 2018-05-14 NOTE — Telephone Encounter (Signed)
Spoke to patient, went over all dates times for bronchoscopy, pre-op and CT scan.

## 2018-05-16 ENCOUNTER — Encounter
Admission: RE | Admit: 2018-05-16 | Discharge: 2018-05-16 | Disposition: A | Discharge status: Home / Self Care | Payer: Medicare Other | Source: Ambulatory Visit | Attending: Pulmonary Disease | Admitting: Pulmonary Disease

## 2018-05-16 ENCOUNTER — Other Ambulatory Visit: Payer: Self-pay

## 2018-05-16 DIAGNOSIS — Z01810 Encounter for preprocedural cardiovascular examination: Secondary | ICD-10-CM

## 2018-05-16 DIAGNOSIS — F17200 Nicotine dependence, unspecified, uncomplicated: Secondary | ICD-10-CM | POA: Insufficient documentation

## 2018-05-16 DIAGNOSIS — Z01818 Encounter for other preprocedural examination: Secondary | ICD-10-CM | POA: Diagnosis not present

## 2018-05-16 DIAGNOSIS — I498 Other specified cardiac arrhythmias: Secondary | ICD-10-CM | POA: Diagnosis not present

## 2018-05-16 HISTORY — DX: Chronic obstructive pulmonary disease, unspecified: J44.9

## 2018-05-16 LAB — CBC
HCT: 42.1 % (ref 36.0–46.0)
Hemoglobin: 14.2 g/dL (ref 12.0–15.0)
MCH: 31.7 pg (ref 26.0–34.0)
MCHC: 33.7 g/dL (ref 30.0–36.0)
MCV: 94 fL (ref 80.0–100.0)
Platelets: 201 10*3/uL (ref 150–400)
RBC: 4.48 MIL/uL (ref 3.87–5.11)
RDW: 13.5 % (ref 11.5–15.5)
WBC: 6.7 10*3/uL (ref 4.0–10.5)
nRBC: 0 % (ref 0.0–0.2)

## 2018-05-16 LAB — BASIC METABOLIC PANEL
Anion gap: 9 (ref 5–15)
BUN: 20 mg/dL (ref 8–23)
CO2: 25 mmol/L (ref 22–32)
Calcium: 9 mg/dL (ref 8.9–10.3)
Chloride: 107 mmol/L (ref 98–111)
Creatinine, Ser: 0.79 mg/dL (ref 0.44–1.00)
GFR calc Af Amer: >60 mL/min (ref >60)
GFR calc non Af Amer: >60 mL/min (ref >60)
Glucose, Bld: 102 mg/dL — ABNORMAL HIGH (ref 70–99)
Potassium: 3.8 mmol/L (ref 3.5–5.1)
SODIUM: 141 mmol/L (ref 135–145)

## 2018-05-16 NOTE — Patient Instructions (Signed)
Your procedure is scheduled on: May 30, 2018 FRIDAY Report to Day Surgery on the 2nd floor of the [facility]. To find out your arrival time, please call [phone] between 1PM - 3PM on: May 29, 2018 THURSDAY  REMEMBER: Instructions that are not followed completely may result in serious medical risk, up to and including death; or upon the discretion of your surgeon and anesthesiologist your surgery may need to be rescheduled.  Do not eat food after midnight the night before surgery.  No gum chewing, lozengers or hard candies.  You may however, drink CLEAR liquids up to 2 hours before you are scheduled to arrive for your surgery. Do not drink anything within 2 hours of the start of your surgery.  Clear liquids include: - water  - apple juice without pulp -CLEAR  gatorade - black coffee or tea (Do NOT add milk or creamers to the coffee or tea) Do NOT drink anything that is not on this list.  Type 1 and Type 2 diabetics should only drink water.  No Alcohol for 24 hours before or after surgery.  No Smoking including e-cigarettes for 24 hours prior to surgery.  No chewable tobacco products for at least 6 hours prior to surgery.  No nicotine patches on the day of surgery.  On the morning of surgery brush your teeth with toothpaste and water, you may rinse your mouth with mouthwash if you wish. Do not swallow any toothpaste or mouthwash.  Notify your doctor if there is any change in your medical condition (cold, fever, infection).  Do not wear jewelry, make-up, hairpins, clips or nail polish.  Do not wear lotions, powders, or perfumes.   Do not shave 48 hours prior to surgery.   Contacts and dentures may not be worn into surgery.  Do not bring valuables to the hospital, including drivers license, insurance or credit cards.  [facility] is not responsible for any belongings or valuables.   TAKE THESE MEDICATIONS THE MORNING OF  SURGERY: BUPROPION CLARITIN ROSUVASTATIN  SHOWER DAY OF SURGERY WITH DIAL SOAP   Use inhalers on the day of surgery    Stop Anti-inflammatories (NSAIDS) such as Advil, Aleve, Ibuprofen, Motrin, Naproxen, Naprosyn and Aspirin based products such as Excedrin, Goodys Powder, BC Powder.LAST DOSE 05-22-2018  Stop ANY OVER THE COUNTER supplements until after surgery. STOP OMEGA-3 05-22-2018 (May continue Vitamin D, Vitamin B, and multivitamin.)  Wear comfortable clothing (specific to your surgery type) to the hospital.  Plan for stool softeners for home use.  If you are being discharged the day of surgery, you will not be allowed to drive home. You will need a responsible adult to drive you home and stay with you that night.   If you are taking public transportation, you will need to have a responsible adult with you. Please confirm with your physician that it is acceptable to use public transportation.   Please call [phone] if you have any questions about these instructions.

## 2018-05-20 ENCOUNTER — Other Ambulatory Visit: Payer: Self-pay

## 2018-05-20 ENCOUNTER — Ambulatory Visit
Admission: RE | Admit: 2018-05-20 | Discharge: 2018-05-20 | Disposition: A | Discharge status: Home / Self Care | Payer: Medicare Other | Source: Ambulatory Visit | Attending: Oncology | Admitting: Oncology

## 2018-05-20 DIAGNOSIS — R911 Solitary pulmonary nodule: Secondary | ICD-10-CM

## 2018-05-21 ENCOUNTER — Telehealth: Payer: Self-pay

## 2018-05-21 NOTE — Telephone Encounter (Signed)
Spoke to patient, she is aware we are cancelling her bronchoscopy. We will call her back once procedures are allowed.

## 2018-05-23 ENCOUNTER — Inpatient Hospital Stay: Payer: Medicare Other | Admitting: Oncology

## 2018-05-30 ENCOUNTER — Ambulatory Visit: Admission: RE | Admit: 2018-05-30 | Payer: Medicare Other | Source: Home / Self Care

## 2018-05-30 ENCOUNTER — Encounter: Admission: RE | Payer: Self-pay | Source: Home / Self Care

## 2018-05-30 SURGERY — ELECTROMAGNETIC NAVIGATION BRONCHOSCOPY
Anesthesia: General | Laterality: Right

## 2018-06-02 ENCOUNTER — Telehealth: Payer: Self-pay | Admitting: *Deleted

## 2018-06-02 ENCOUNTER — Ambulatory Visit: Payer: Medicare Other | Admitting: Radiation Oncology

## 2018-06-02 NOTE — Telephone Encounter (Signed)
Message left with patient that appt with Dr. Baruch Gouty on 4/2 has been cancelled due to biopsy being cancelled. Instructed to call back if has any further questions.

## 2018-06-05 ENCOUNTER — Ambulatory Visit: Payer: Medicare Other | Admitting: Oncology

## 2018-06-05 ENCOUNTER — Ambulatory Visit: Payer: Medicare Other | Admitting: Radiation Oncology

## 2018-06-23 ENCOUNTER — Ambulatory Visit: Payer: Medicare Other | Admitting: Oncology

## 2018-07-04 ENCOUNTER — Telehealth: Payer: Self-pay

## 2018-07-04 DIAGNOSIS — R918 Other nonspecific abnormal finding of lung field: Secondary | ICD-10-CM

## 2018-07-04 NOTE — Telephone Encounter (Signed)
Order entered for CT scan STAT for bronchoscopy planning.

## 2018-07-08 ENCOUNTER — Ambulatory Visit: Payer: Medicare Other

## 2018-07-09 ENCOUNTER — Ambulatory Visit
Admission: RE | Admit: 2018-07-09 | Discharge: 2018-07-09 | Disposition: A | Discharge status: Home / Self Care | Payer: Medicare Other | Source: Ambulatory Visit | Attending: Pulmonary Disease | Admitting: Pulmonary Disease

## 2018-07-09 ENCOUNTER — Other Ambulatory Visit: Payer: Self-pay

## 2018-07-09 DIAGNOSIS — R918 Other nonspecific abnormal finding of lung field: Secondary | ICD-10-CM

## 2018-07-14 ENCOUNTER — Telehealth: Payer: Self-pay | Admitting: Pulmonary Disease

## 2018-07-14 NOTE — Telephone Encounter (Signed)
Called and spoke to pt, who is requesting CT results 07/09/2018. I have made pt aware that these results are typically discussed at time of office visit.  Pt is requesting results prior to 07/17/2018 OV.   Dr. Patsey Berthold please advise. Thanks

## 2018-07-15 NOTE — Telephone Encounter (Signed)
Patient aware. Will follow-up with more questions at visit Thursday.

## 2018-07-15 NOTE — Telephone Encounter (Signed)
Lesion slightly larger. Still needs biopsy. Will discuss on OV

## 2018-07-17 ENCOUNTER — Telehealth: Payer: Self-pay

## 2018-07-17 ENCOUNTER — Other Ambulatory Visit: Payer: Self-pay

## 2018-07-17 ENCOUNTER — Encounter: Payer: Self-pay | Admitting: Pulmonary Disease

## 2018-07-17 ENCOUNTER — Ambulatory Visit (INDEPENDENT_AMBULATORY_CARE_PROVIDER_SITE_OTHER): Payer: Medicare Other | Admitting: Pulmonary Disease

## 2018-07-17 VITALS — BP 118/64 | HR 89 | Ht 63.0 in | Wt 133.6 lb

## 2018-07-17 DIAGNOSIS — R918 Other nonspecific abnormal finding of lung field: Secondary | ICD-10-CM

## 2018-07-17 DIAGNOSIS — F17210 Nicotine dependence, cigarettes, uncomplicated: Secondary | ICD-10-CM

## 2018-07-17 DIAGNOSIS — J449 Chronic obstructive pulmonary disease, unspecified: Secondary | ICD-10-CM

## 2018-07-17 NOTE — Telephone Encounter (Signed)
Bronchoscopy 07/30/18 @1300  ENB ([id number]) and EBUS ([id number], [id number]) For right lower lobe mass (ENB) and staging (EBUS)

## 2018-07-17 NOTE — H&P (View-Only) (Signed)
Subjective:    Patient ID: Latoya Lynch, female    DOB: 1940-04-02, 78 y.o.   MRN: [id number]  HPI  Patient is a 78 year old current smoker (half a pack of cigarettes per day) presents for follow-up of a right lower lobe mass.  The mass was found in September 2019 during a CT for evaluation of potential aortic aneurysm. The mass was studied with PET CT on 13 December 2017 that showed mild hypermetabolic activity.  She underwent biopsy of the mass via percutaneous means on 13 January 2018.  The mass bled significantly during the procedure.  Patient also described hemoptysis after the procedure.  Per radiology, the question of whether this was an AVM arose.  We initially saw her on 27 March 2018.  She was referred for potential navigational bronchoscopy.  She was reluctant to undergo another procedure however, was willing to study the mass more carefully to exclude potential AVM.  She has had a PE protocol CT chest that showed no evidence of the mass being an AVM.  Of note the mass has been noted to increase in size 2 mm since the first CT in September 2019. Patient also underwent 2D echo with bubble contrast and this was not consistent with an intrapulmonary shunt. 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  This was discussed with the patient at her follow-up visit of 19 February.    That time she was still reluctant to undergo another procedure but was leaning towards having navigational bronchoscopy rather than VATS or lobectomy.  Patient continue to postpone her procedure till the end of March.  In the meantime we had starting to have issues with the COVID-19 pandemic and her case has been delayed until now.  She underwent super dimension CT on 09 Jul 2018.  Since her prior CT performed in March the lesion has grown somewhat.   I have reviewed the films independently and have shown the films to the patient.  She is now willing to undergo bronchoscopy with navigational  assistance.  She had opportunity to ask questions with regards to the procedure.  The procedure was described to her in detail including that she would be under general anesthesia.  She understands the potential benefits, limitations and complications of the procedure and agrees to go ahead.   Since her prior visit she has not had any new pulmonary symptoms.  She does not describe any chest pain, orthopnea or paroxysmal nocturnal dyspnea.  No lower extremity edema.  No fevers, chills or sweats.  No exposures to high risk individuals for COVID-19.  Patient continues to use Anoro Ellipta and feels that this medication has helped her with a chronic cough.  She does not have cough as a symptom anymore.  She has not had any hemoptysis.  Review of Systems  Constitutional: Negative.   HENT: Negative.   Eyes: Negative.   Respiratory: Negative.   Cardiovascular: Negative.   Gastrointestinal: Negative.   Musculoskeletal: Negative.   Allergic/Immunologic: Negative.   Neurological: Negative.   All other systems reviewed and are negative.      Objective:   Physical Exam Vitals signs and nursing note reviewed.  Constitutional:      General: She is not in acute distress.    Appearance: Normal appearance. She is normal weight. She is not ill-appearing.  HENT:     Head: Normocephalic and atraumatic.     Comments: Nose/mouth examination limited due to mask requirement during COVID-19 endemic    Right  Ear: External ear normal.     Left Ear: External ear normal.  Eyes:     General: No scleral icterus.    Extraocular Movements: Extraocular movements intact.     Conjunctiva/sclera: Conjunctivae normal.     Pupils: Pupils are equal, round, and reactive to light.  Neck:     Musculoskeletal: Normal range of motion and neck supple.     Thyroid: No thyromegaly.     Trachea: Trachea and phonation normal.  Cardiovascular:     Rate and Rhythm: Normal rate and regular rhythm.     Pulses: Normal pulses.      Heart sounds: Normal heart sounds. No murmur.  Pulmonary:     Effort: Pulmonary effort is normal. No respiratory distress.     Breath sounds: No wheezing, rhonchi or rales.     Comments: Coarse breath sounds, moving air well. Abdominal:     General: Abdomen is flat. There is no distension.     Palpations: Abdomen is soft.  Musculoskeletal: Normal range of motion.     Right lower leg: No edema.     Left lower leg: No edema.  Lymphadenopathy:     Cervical: No cervical adenopathy.  Skin:    General: Skin is warm and dry.  Neurological:     General: No focal deficit present.     Mental Status: She is alert and oriented to person, place, and time.  Psychiatric:        Mood and Affect: Mood normal.        Behavior: Behavior normal.    Chest CT from 6 May independently reviewed.  The right lower lobe mass appears indeed enlarged from prior now measuring 2.1 cm.  There is an airway coursing through it.  There is no evidence of metastatic disease.  Patient still requires diagnosis.  Previous percutaneous biopsy was inconclusive.       Assessment & Plan:   1. Mass of the lower lobe of the right lung: This is an enlarging mass, it is FDG avid but has low avidity. The differential diagnoses include malignancy, AVM has been ruled out by PE protocol CT and no evidence of  intrapulmonary shunting on echocardiogram. 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 I have recommended the patient undergo navigational bronchoscopy with biopsy.  She is now willing to undergo this procedure.She understands the procedure will be done under general anesthesia.  She also understands that during the procedure she will have endobronchial ultrasound for staging purposes.  The procedure has been scheduled for 27 May at 1:15 in the afternoon.   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 Continue the same.  3. Tobacco dependence due to cigarettes:Patient was counseled with regards to discontinuation of smoking total counseling time 3 to 5 minutes.    This chart was dictated using voice recognition software/Dragon.  Despite best efforts to proofread, errors can occur which can change the meaning.  Any change was purely unintentional.

## 2018-07-17 NOTE — Telephone Encounter (Signed)
Will close encounter, as nothing further is needed.  

## 2018-07-17 NOTE — Telephone Encounter (Signed)
Patient seen today all concerns addressed.

## 2018-07-17 NOTE — Progress Notes (Addendum)
Subjective:    Patient ID: Latoya Lynch, female    DOB: 1940/05/03, 78 y.o.   MRN: [id number]  HPI  Patient is a 78 year old current smoker (half a pack of cigarettes per day) presents for follow-up of a right lower lobe mass.  The mass was found in September 2019 during a CT for evaluation of potential aortic aneurysm. The mass was studied with PET CT on 13 December 2017 that showed mild hypermetabolic activity.  She underwent biopsy of the mass via percutaneous means on 13 January 2018.  The mass bled significantly during the procedure.  Patient also described hemoptysis after the procedure.  Per radiology, the question of whether this was an AVM arose.  We initially saw her on 27 March 2018.  She was referred for potential navigational bronchoscopy.  She was reluctant to undergo another procedure however, was willing to study the mass more carefully to exclude potential AVM.  She has had a PE protocol CT chest that showed no evidence of the mass being an AVM.  Of note the mass has been noted to increase in size 2 mm since the first CT in September 2019. Patient also underwent 2D echo with bubble contrast and this was not consistent with an intrapulmonary shunt. 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  This was discussed with the patient at her follow-up visit of 19 February.    That time she was still reluctant to undergo another procedure but was leaning towards having navigational bronchoscopy rather than VATS or lobectomy.  Patient continue to postpone her procedure till the end of March.  In the meantime we had starting to have issues with the COVID-19 pandemic and her case has been delayed until now.  She underwent super dimension CT on 09 Jul 2018.  Since her prior CT performed in March the lesion has grown somewhat.   I have reviewed the films independently and have shown the films to the patient.  She is now willing to undergo bronchoscopy with navigational  assistance.  She had opportunity to ask questions with regards to the procedure.  The procedure was described to her in detail including that she would be under general anesthesia.  She understands the potential benefits, limitations and complications of the procedure and agrees to go ahead.   Since her prior visit she has not had any new pulmonary symptoms.  She does not describe any chest pain, orthopnea or paroxysmal nocturnal dyspnea.  No lower extremity edema.  No fevers, chills or sweats.  No exposures to high risk individuals for COVID-19.  Patient continues to use Anoro Ellipta and feels that this medication has helped her with a chronic cough.  She does not have cough as a symptom anymore.  She has not had any hemoptysis.  Review of Systems  Constitutional: Negative.   HENT: Negative.   Eyes: Negative.   Respiratory: Negative.   Cardiovascular: Negative.   Gastrointestinal: Negative.   Musculoskeletal: Negative.   Allergic/Immunologic: Negative.   Neurological: Negative.   All other systems reviewed and are negative.      Objective:   Physical Exam Vitals signs and nursing note reviewed.  Constitutional:      General: She is not in acute distress.    Appearance: Normal appearance. She is normal weight. She is not ill-appearing.  HENT:     Head: Normocephalic and atraumatic.     Comments: Nose/mouth examination limited due to mask requirement during COVID-19 endemic    Right  Ear: External ear normal.     Left Ear: External ear normal.  Eyes:     General: No scleral icterus.    Extraocular Movements: Extraocular movements intact.     Conjunctiva/sclera: Conjunctivae normal.     Pupils: Pupils are equal, round, and reactive to light.  Neck:     Musculoskeletal: Normal range of motion and neck supple.     Thyroid: No thyromegaly.     Trachea: Trachea and phonation normal.  Cardiovascular:     Rate and Rhythm: Normal rate and regular rhythm.     Pulses: Normal pulses.      Heart sounds: Normal heart sounds. No murmur.  Pulmonary:     Effort: Pulmonary effort is normal. No respiratory distress.     Breath sounds: No wheezing, rhonchi or rales.     Comments: Coarse breath sounds, moving air well. Abdominal:     General: Abdomen is flat. There is no distension.     Palpations: Abdomen is soft.  Musculoskeletal: Normal range of motion.     Right lower leg: No edema.     Left lower leg: No edema.  Lymphadenopathy:     Cervical: No cervical adenopathy.  Skin:    General: Skin is warm and dry.  Neurological:     General: No focal deficit present.     Mental Status: She is alert and oriented to person, place, and time.  Psychiatric:        Mood and Affect: Mood normal.        Behavior: Behavior normal.    Chest CT from 6 May independently reviewed.  The right lower lobe mass appears indeed enlarged from prior now measuring 2.1 cm.  There is an airway coursing through it.  There is no evidence of metastatic disease.  Patient still requires diagnosis.  Previous percutaneous biopsy was inconclusive.       Assessment & Plan:   1. Mass of the lower lobe of the right lung: This is an enlarging mass, it is FDG avid but has low avidity. The differential diagnoses include malignancy, AVM has been ruled out by PE protocol CT and no evidence of  intrapulmonary shunting on echocardiogram. 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 I have recommended the patient undergo navigational bronchoscopy with biopsy.  She is now willing to undergo this procedure.She understands the procedure will be done under general anesthesia.  She also understands that during the procedure she will have endobronchial ultrasound for staging purposes.  The procedure has been scheduled for 27 May at 1:15 in the afternoon.   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 Continue the same.  3. Tobacco dependence due to cigarettes:Patient was counseled with regards to discontinuation of smoking total counseling time 3 to 5 minutes.    This chart was dictated using voice recognition software/Dragon.  Despite best efforts to proofread, errors can occur which can change the meaning.  Any change was purely unintentional.

## 2018-07-17 NOTE — Telephone Encounter (Signed)
07/17/2018 at 12:30 pm EST spoke with Daleen Bo at [facility]. Procedure codes [phone], [phone] and [phone] does not require PA as long as done outpatient. Call Ref # [id number] StephanieW05/14/2020. [name]

## 2018-07-17 NOTE — Patient Instructions (Signed)
Plan for Bronchoscopy

## 2018-07-24 ENCOUNTER — Other Ambulatory Visit: Payer: Self-pay

## 2018-07-24 ENCOUNTER — Encounter
Admission: RE | Admit: 2018-07-24 | Discharge: 2018-07-24 | Disposition: A | Discharge status: Home / Self Care | Payer: Medicare Other | Source: Ambulatory Visit | Attending: Pulmonary Disease | Admitting: Pulmonary Disease

## 2018-07-24 NOTE — Patient Instructions (Signed)
Your procedure is scheduled on: 07-30-18 [facility] Report to Same Day Surgery 2nd floor medical mall [facility] Entrance-take elevator on left to 2nd floor.  Check in with surgery information desk.) To find out your arrival time please call [phone] between 1PM - 3PM on 07-29-18 TUESDAY  Remember: Instructions that are not followed completely may result in serious medical risk, up to and including death, or upon the discretion of your surgeon and anesthesiologist your surgery may need to be rescheduled.    _x___ 1. Do not eat food after midnight the night before your procedure. NO GUM OR CANDY AFTER MIDNIGHT.  You may drink clear liquids up to 2 hours before you are scheduled to arrive at the hospital for your procedure.  Do not drink clear liquids within 2 hours of your scheduled arrival to the hospital.  Clear liquids include  --Water or Apple juice without pulp  --Gatorade  --Black Coffee or Clear Tea (No milk, no creamers, do not add anything to the coffee or Tea  ____Ensure clear carbohydrate drink on the way to the hospital for bariatric patients  ____Ensure clear carbohydrate drink 3 hours before surgery for Dr Dwyane Luo patients if physician instructed.     __x__ 2. No Alcohol for 24 hours before or after surgery.   __x__3. No Smoking or e-cigarettes for 24 prior to surgery.  Do not use any chewable tobacco products for at least 6 hour prior to surgery   ____  4. Bring all medications with you on the day of surgery if instructed.    __x__ 5. Notify your doctor if there is any change in your medical condition     (cold, fever, infections).    x___6. On the morning of surgery brush your teeth with toothpaste and water.  You may rinse your mouth with mouth wash if you wish.  Do not swallow any toothpaste or mouthwash.   Do not wear jewelry, make-up, hairpins, clips or nail polish.  Do not wear lotions, powders, or perfumes. You may wear deodorant.  Do not shave 48 hours  prior to surgery. Men may shave face and neck.  Do not bring valuables to the hospital.    [facility] is not responsible for any belongings or valuables.               Contacts, dentures or bridgework may not be worn into surgery.  Leave your suitcase in the car. After surgery it may be brought to your room.  For patients admitted to the hospital, discharge time is determined by your treatment team.  _  Patients discharged the day of surgery will not be allowed to drive home.  You will need someone to drive you home and stay with you the night of your procedure.    Please read over the following fact sheets that you were given:   [facility] Preparing for Surgery   _x___ TAKE THE FOLLOWING MEDICATION THE MORNING OF SURGERY WITH A SMALL SIP OF WATER. These include:  1. WELLBUTRIN   2. CLARITIN  3.  4.  5.  6.  ____Fleets enema or Magnesium Citrate as directed.   ____ Use CHG Soap or sage wipes as directed on instruction sheet   _X___ Use inhalers on the day of surgery and bring to hospital day of surgery-USE ANORO ELLIPTA AM OF SURGERY  ____ Stop Metformin and Janumet 2 days prior to surgery.    ____ Take 1/2 of usual insulin dose the night before surgery  and none on the morning surgery.   ____ Follow recommendations from Cardiologist, Pulmonologist or PCP regarding stopping Aspirin, Coumadin, Plavix ,Eliquis, Effient, or Pradaxa, and Pletal.  X____Stop Anti-inflammatories such as Advil, Aleve, Ibuprofen, Motrin, Naproxen, Naprosyn, Goodies powders or aspirin products NOW-OK to take Tylenol    _x___ Stop supplements until after surgery-STOP FISH OIL NOW-MAY RESUME AFTER SURGERY   ____ Bring C-Pap to the hospital.

## 2018-07-25 ENCOUNTER — Other Ambulatory Visit: Payer: Self-pay

## 2018-07-25 ENCOUNTER — Other Ambulatory Visit
Admission: RE | Admit: 2018-07-25 | Discharge: 2018-07-25 | Disposition: A | Discharge status: Home / Self Care | Payer: Medicare Other | Source: Ambulatory Visit | Attending: Pulmonary Disease | Admitting: Pulmonary Disease

## 2018-07-25 DIAGNOSIS — Z1159 Encounter for screening for other viral diseases: Secondary | ICD-10-CM | POA: Diagnosis present

## 2018-07-26 LAB — NOVEL CORONAVIRUS, NAA (HOSP ORDER, SEND-OUT TO REF LAB; TAT 18-24 HRS): SARS-CoV-2, NAA: NOT DETECTED

## 2018-07-29 ENCOUNTER — Encounter: Payer: Self-pay | Admitting: *Deleted

## 2018-07-30 ENCOUNTER — Encounter: Payer: Self-pay | Admitting: *Deleted

## 2018-07-30 ENCOUNTER — Other Ambulatory Visit: Payer: Self-pay

## 2018-07-30 ENCOUNTER — Ambulatory Visit: Payer: Medicare Other | Admitting: Anesthesiology

## 2018-07-30 ENCOUNTER — Ambulatory Visit: Payer: Medicare Other

## 2018-07-30 ENCOUNTER — Ambulatory Visit
Admission: RE | Admit: 2018-07-30 | Discharge: 2018-07-30 | Disposition: A | Discharge status: Home / Self Care | Payer: Medicare Other | Attending: Pulmonary Disease | Admitting: Pulmonary Disease

## 2018-07-30 ENCOUNTER — Encounter
Admission: RE | Disposition: A | Discharge status: Home / Self Care | Payer: Self-pay | Source: Home / Self Care | Attending: Pulmonary Disease

## 2018-07-30 DIAGNOSIS — F17210 Nicotine dependence, cigarettes, uncomplicated: Secondary | ICD-10-CM | POA: Insufficient documentation

## 2018-07-30 DIAGNOSIS — Z8614 Personal history of Methicillin resistant Staphylococcus aureus infection: Secondary | ICD-10-CM | POA: Diagnosis not present

## 2018-07-30 DIAGNOSIS — R918 Other nonspecific abnormal finding of lung field: Secondary | ICD-10-CM

## 2018-07-30 DIAGNOSIS — J449 Chronic obstructive pulmonary disease, unspecified: Secondary | ICD-10-CM | POA: Insufficient documentation

## 2018-07-30 DIAGNOSIS — Z96641 Presence of right artificial hip joint: Secondary | ICD-10-CM | POA: Diagnosis not present

## 2018-07-30 DIAGNOSIS — Z9889 Other specified postprocedural states: Secondary | ICD-10-CM

## 2018-07-30 HISTORY — PX: ELECTROMAGNETIC NAVIGATION BROCHOSCOPY: SHX5369

## 2018-07-30 HISTORY — PX: ENDOBRONCHIAL ULTRASOUND: SHX5096

## 2018-07-30 HISTORY — DX: Personal history of Methicillin resistant Staphylococcus aureus infection: Z86.14

## 2018-07-30 SURGERY — ELECTROMAGNETIC NAVIGATION BRONCHOSCOPY
Anesthesia: General | Laterality: Right

## 2018-07-30 MED ORDER — FAMOTIDINE 20 MG PO TABS
20.0000 mg | ORAL_TABLET | Freq: Once | ORAL | Status: AC
  Administered 2018-07-30: 20 mg via ORAL

## 2018-07-30 MED ORDER — FAMOTIDINE 20 MG PO TABS
ORAL_TABLET | ORAL | Status: AC
  Filled 2018-07-30: qty 1

## 2018-07-30 MED ORDER — ROCURONIUM BROMIDE 50 MG/5ML IV SOLN
INTRAVENOUS | Status: AC
  Filled 2018-07-30: qty 1

## 2018-07-30 MED ORDER — ROCURONIUM BROMIDE 100 MG/10ML IV SOLN
INTRAVENOUS | Status: DC | PRN
  Administered 2018-07-30: 15 mg via INTRAVENOUS
  Administered 2018-07-30: 5 mg via INTRAVENOUS

## 2018-07-30 MED ORDER — FENTANYL CITRATE (PF) 100 MCG/2ML IJ SOLN
INTRAMUSCULAR | Status: DC | PRN
  Administered 2018-07-30: 50 ug via INTRAVENOUS

## 2018-07-30 MED ORDER — PHENYLEPHRINE HCL (PRESSORS) 10 MG/ML IV SOLN
INTRAVENOUS | Status: DC | PRN
  Administered 2018-07-30: 100 ug via INTRAVENOUS

## 2018-07-30 MED ORDER — FENTANYL CITRATE (PF) 100 MCG/2ML IJ SOLN
INTRAMUSCULAR | Status: AC
  Filled 2018-07-30: qty 2

## 2018-07-30 MED ORDER — PROPOFOL 10 MG/ML IV BOLUS
INTRAVENOUS | Status: DC | PRN
  Administered 2018-07-30: 130 mg via INTRAVENOUS

## 2018-07-30 MED ORDER — LIDOCAINE HCL (PF) 2 % IJ SOLN
INTRAMUSCULAR | Status: AC
  Filled 2018-07-30: qty 10

## 2018-07-30 MED ORDER — BUTAMBEN-TETRACAINE-BENZOCAINE 2-2-14 % EX AERO
1.0000 | INHALATION_SPRAY | Freq: Once | CUTANEOUS | Status: DC
  Filled 2018-07-30: qty 20

## 2018-07-30 MED ORDER — SODIUM CHLORIDE 0.9 % IV SOLN
Freq: Once | INTRAVENOUS | Status: AC
  Administered 2018-07-30: 12:00:00 via INTRAVENOUS

## 2018-07-30 MED ORDER — LIDOCAINE HCL (CARDIAC) PF 100 MG/5ML IV SOSY
PREFILLED_SYRINGE | INTRAVENOUS | Status: DC | PRN
  Administered 2018-07-30: 60 mg via INTRAVENOUS

## 2018-07-30 MED ORDER — LACTATED RINGERS IV SOLN: INTRAVENOUS | Status: DC

## 2018-07-30 MED ORDER — MIDAZOLAM HCL 2 MG/2ML IJ SOLN
INTRAMUSCULAR | Status: AC
  Filled 2018-07-30: qty 2

## 2018-07-30 MED ORDER — SUCCINYLCHOLINE CHLORIDE 20 MG/ML IJ SOLN
INTRAMUSCULAR | Status: DC | PRN
  Administered 2018-07-30: 120 mg via INTRAVENOUS

## 2018-07-30 MED ORDER — PROPOFOL 10 MG/ML IV BOLUS
INTRAVENOUS | Status: AC
  Filled 2018-07-30: qty 20

## 2018-07-30 MED ORDER — FENTANYL CITRATE (PF) 100 MCG/2ML IJ SOLN: 25.0000 ug | INTRAMUSCULAR | Status: DC | PRN

## 2018-07-30 MED ORDER — ONDANSETRON HCL 4 MG/2ML IJ SOLN
INTRAMUSCULAR | Status: AC
  Filled 2018-07-30: qty 2

## 2018-07-30 MED ORDER — MIDAZOLAM HCL 5 MG/5ML IJ SOLN
INTRAMUSCULAR | Status: DC | PRN
  Administered 2018-07-30: 2 mg via INTRAVENOUS

## 2018-07-30 MED ORDER — ONDANSETRON HCL 4 MG/2ML IJ SOLN: 4.0000 mg | Freq: Once | INTRAMUSCULAR | Status: DC | PRN

## 2018-07-30 MED ORDER — SUGAMMADEX SODIUM 200 MG/2ML IV SOLN
INTRAVENOUS | Status: DC | PRN
  Administered 2018-07-30: 130 mg via INTRAVENOUS

## 2018-07-30 MED ORDER — SUGAMMADEX SODIUM 200 MG/2ML IV SOLN
INTRAVENOUS | Status: AC
  Filled 2018-07-30: qty 2

## 2018-07-30 NOTE — Anesthesia Procedure Notes (Signed)
Procedure Name: Intubation Date/Time: 07/30/2018 1:12 PM Performed by: Dionne Bucy, CRNA Pre-anesthesia Checklist: Patient identified, Patient being monitored, Timeout performed, Emergency Drugs available and Suction available Patient Re-evaluated:Patient Re-evaluated prior to induction Oxygen Delivery Method: Circle system utilized Preoxygenation: Pre-oxygenation with 100% oxygen Induction Type: IV induction Ventilation: Mask ventilation without difficulty Laryngoscope Size: 3 and McGraph Grade View: Grade II Tube type: Oral Tube size: 8.5 mm Number of attempts: 1 Airway Equipment and Method: Stylet and Video-laryngoscopy Placement Confirmation: ETT inserted through vocal cords under direct vision,  positive ETCO2 and breath sounds checked- equal and bilateral Secured at: 20 cm Tube secured with: Tape Dental Injury: Teeth and Oropharynx as per pre-operative assessment

## 2018-07-30 NOTE — Discharge Instructions (Signed)
Pulmonary Nodule A pulmonary nodule is tissue that has grown on your lung. A nodule may be cancer, but most nodules are not cancer. Follow these instructions at home:   Take over-the-counter and prescription medicines only as told by your doctor.  Do not use any products that have nicotine or tobacco, such as cigarettes and e-cigarettes. If you need help quitting, ask your doctor.  Keep all follow-up visits as told by your doctor. This is important. Contact a doctor if:  You have trouble breathing when doing activities.  You feel sick.  You feel more tired than normal.  You do not feel like eating.  You lose weight without trying.  You have chills.  You have night sweats. Get help right away if:  You cannot catch your breath.  You start making whistling sounds when breathing (wheezing).  You cannot stop coughing.  You cough up blood.  You get dizzy.  You feel like you are going to pass out (faint).  You have sudden chest pain.  You have a fever or symptoms for more than 2-3 days.  You have a fever and your symptoms suddenly get worse. Summary  A pulmonary nodule is tissue that has grown on your lung.  Most nodules are not cancer.  Your doctor will do tests to know what kind of nodule you have, and whether you need treatment for it. This information is not intended to replace advice given to you by your health care provider. Make sure you discuss any questions you have with your health care provider. Document Released: 03/24/2010 Document Revised: 03/20/2016 Document Reviewed: 03/20/2016 Elsevier Interactive Patient Education  2019 [employer]   1) The drugs that you were given will stay in your system until tomorrow so for the next 24 hours you should not:  A) Drive an automobile B) Make any legal decisions C) Drink any alcoholic beverage   2) You may resume regular meals tomorrow.  Today it is better  to start with liquids and gradually work up to solid foods.  You may eat anything you prefer, but it is better to start with liquids, then soup and crackers, and gradually work up to solid foods.   3) Please notify your doctor immediately if you have any unusual bleeding, trouble breathing, redness and pain at the surgery site, drainage, fever, or pain not relieved by medication. 4)   5) Your post-operative visit with Dr.                                     is: Date:                        Time:    Please call to schedule your post-operative visit.  6) Additional Instructions:       Flexible Bronchoscopy, Care After This sheet gives you information about how to care for yourself after your test. Your doctor may also give you more specific instructions. If you have problems or questions, contact your doctor. Follow these instructions at home: Eating and drinking  Do not eat or drink anything (not even water) for 2 hours after your test, or until your numbing medicine (local anesthetic) wears off.  When your numbness is gone and your cough and gag reflexes have come back, you may: ? Eat only soft foods. ? Slowly drink liquids.  The day after the test, go back to your normal diet. Driving  Do not drive for 24 hours if you were given a medicine to help you relax (sedative).  Do not drive or use heavy machinery while taking prescription pain medicine. General instructions   Take over-the-counter and prescription medicines only as told by your doctor.  Return to your normal activities as told. Ask what activities are safe for you.  Do not use any products that have nicotine or tobacco in them. This includes cigarettes and e-cigarettes. If you need help quitting, ask your doctor.  Keep all follow-up visits as told by your doctor. This is important. It is very important if you had a tissue sample (biopsy) taken. Get help right away if:  You have shortness of breath that gets  worse.  You get light-headed.  You feel like you are going to pass out (faint).  You have chest pain.  You cough up: ? More than a little blood. ? More blood than before. Summary  Do not eat or drink anything (not even water) for 2 hours after your test, or until your numbing medicine wears off.  Do not use cigarettes. Do not use e-cigarettes.  Get help right away if you have chest pain. This information is not intended to replace advice given to you by your health care provider. Make sure you discuss any questions you have with your health care provider. Document Released: 12/17/2008 Document Revised: 03/09/2016 Document Reviewed: 03/09/2016 Elsevier Interactive Patient Education  2019 [employer].

## 2018-07-30 NOTE — Transfer of Care (Signed)
Immediate Anesthesia Transfer of Care Note  Patient: Latoya Lynch  Procedure(s) Performed: ELECTROMAGNETIC NAVIGATION BRONCHOSCOPY RIGHT (Right ) ENDOBRONCHIAL ULTRASOUND RIGHT (Right )  Patient Location: PACU  Anesthesia Type:General  Level of Consciousness: sedated  Airway & Oxygen Therapy: Patient Spontanous Breathing and Patient connected to face mask oxygen  Post-op Assessment: Report given to RN and Post -op Vital signs reviewed and stable  Post vital signs: Reviewed and stable  Last Vitals:  Vitals Value Taken Time  BP 142/82 07/30/2018  2:30 PM  Temp 36.3 C 07/30/2018  2:29 PM  Pulse 88 07/30/2018  2:32 PM  Resp 18 07/30/2018  2:32 PM  SpO2 100 % 07/30/2018  2:32 PM  Vitals shown include unvalidated device data.  Last Pain:  Vitals:   07/30/18 1429  TempSrc:   PainSc: Asleep         Complications: No apparent anesthesia complications

## 2018-07-30 NOTE — Interval H&P Note (Signed)
History and Physical Interval Note:  07/30/2018 11:36 AM  Latoya Lynch  has presented today for surgery, with the diagnosis of RIGHT LOWER LOBE MASS FOR STAGING.  The various methods of treatment have been discussed with the patient and family. After consideration of risks, benefits and other options for treatment, the patient has consented to  Procedure(s): ELECTROMAGNETIC NAVIGATION BRONCHOSCOPY RIGHT (Right) ENDOBRONCHIAL ULTRASOUND RIGHT (Right) as a surgical intervention.  The patient's history has been reviewed, patient examined, no change in status, stable for surgery.  I have reviewed the patient's chart and labs.  Questions were answered to the patient's satisfaction.     Vernard Gambles

## 2018-07-30 NOTE — Anesthesia Post-op Follow-up Note (Signed)
Anesthesia QCDR form completed.        

## 2018-07-30 NOTE — Anesthesia Preprocedure Evaluation (Signed)
Anesthesia Evaluation  Patient identified by MRN, date of birth, ID band Patient awake    Reviewed: Allergy & Precautions, H&P , NPO status , Patient's Chart, lab work & pertinent test results, reviewed documented beta blocker date and time   Airway Mallampati: II  TM Distance: >3 FB Neck ROM: full    Dental  (+) Teeth Intact   Pulmonary COPD,  COPD inhaler, Current Smoker,    Pulmonary exam normal        Cardiovascular Exercise Tolerance: Poor negative cardio ROS Normal cardiovascular exam Rhythm:regular Rate:Normal     Neuro/Psych PSYCHIATRIC DISORDERS Depression negative neurological ROS     GI/Hepatic negative GI ROS, Neg liver ROS,   Endo/Other  negative endocrine ROS  Renal/GU negative Renal ROS  negative genitourinary   Musculoskeletal   Abdominal   Peds  Hematology negative hematology ROS (+)   Anesthesia Other Findings Past Medical History: No date: COPD (chronic obstructive pulmonary disease) ([place]) No date: Depression No date: History of methicillin resistant staphylococcus aureus (MRSA) No date: Hyperlipidemia Past Surgical History: No date: CATARACT EXTRACTION W/ INTRAOCULAR LENS  IMPLANT, BILATERAL 01/2003: CEREBRAL ANEURYSM REPAIR 05/2017: retinal detatchment; Right No date: TONSILLECTOMY 01/2015: TOTAL HIP ARTHROPLASTY; Right No date: TUBAL LIGATION   Reproductive/Obstetrics negative OB ROS                             Anesthesia Physical Anesthesia Plan  ASA: III  Anesthesia Plan: General ETT   Post-op Pain Management:    Induction:   PONV Risk Score and Plan:   Airway Management Planned: Video Laryngoscope Planned  Additional Equipment:   Intra-op Plan:   Post-operative Plan:   Informed Consent: I have reviewed the patients History and Physical, chart, labs and discussed the procedure including the risks, benefits and alternatives for the proposed  anesthesia with the patient or authorized representative who has indicated his/her understanding and acceptance.     Dental Advisory Given  Plan Discussed with: CRNA  Anesthesia Plan Comments:         Anesthesia Quick Evaluation

## 2018-07-30 NOTE — Op Note (Addendum)
Electromagnetic Navigation Bronchoscopy Confocal Laser Endomicroscopy Endobronchial Ultrasound   Indication: RIGHT lower lobe lung mass, possible hilar adenopathy  Preoperative Diagnosis:RIGHT Lower lobe lung /mass, possible hilar adenopathy Post Procedure Diagnosis: RIGHT lower lobe mass, NO hilar adenopathy  Consent: Verbal/Written  The Risks and Benefits of the procedure explained to patient/family prior to start of procedure and I have discussed the risk for acute bleeding, increased chance of infection, increased chance of respiratory failure and cardiac arrest and death.   The patient/family understand the benefits, limitations and potential complications of the procedure and have agreed to go ahead with the same.   Hand washing performed prior to starting the procedure.  Appropriate timeout was taken prior to the procedure.  Type of Anesthesia: General endotracheal anesthesia, see anesthesia records.  Procedure (s) performed: 1.  Virtual Bronchoscopy with Multi-planar Image analysis, 3-D reconstruction of coronal, sagittal and multi-planar images for the purposes of planning real-time bronchoscopy using the iLogic Electromagnetic Navigation Bronchoscopy System (superDimension).  2.  Confocal Laser Endomicroscopy 3.  Endobronchial ultrasound  Description of Procedure(s): Patient was taken to the bronchoscopy procedure room where she was inducted under general anesthesia by CRNA/anesthesiologist.  A Portex adapter was placed at the end of the endotracheal tube.  Once the patient was under adequate anesthesia the Olympus video bronchoscope was advanced through the Portex adapter into the airway.  The airways were inspected to include all subsegments.  No endobronchial lesions were noted.  Patient did have copious thick secretions noted that were lavaged till clear.  At this point the super dimension extended working channel and LG probe were placed through the working channel of the  bronchoscope and the probe was directed to the central portion of the trachea.  The LG was directed to standard registration points at the following centers: main carina, right upper lobe bronchus, right lower lobe bronchus, right middle lobe bronchus, left upper lobe bronchus, and the left lower lobe bronchus. This data was transferred to the i-Logic ENB system for real-time bronchoscopy.   The scope was then navigated to the [place] for tissue sampling of the lesion in question.  The lesion was visible with fluoroscopy.  Good acquisition was confirmed with fluoroscopy and with endo-microscopy.  Endo-microscopy showed abnormal tissue that was ill characterized.  Once target acquisition was confirmed the area was blanched with 3 mL's of epinephrine 1-10,000 solution to minimize bleeding.  Rapid onsite evaluation was available.  Specimens were obtained as below:   1) Transbronchial Fine Needle Aspirations 21G  RLL X 4  2) Transbronchial Forceps Biopsy X8  3) Transbronchial Brush X3  4) Bronchoalveolar lavage: 40 mL's instilled, 10 mL's aliquot.  All specimens sent for cytology/histology.  Fluoroscopy:  Fluoroscopy was utilized during the course of this procedure to assure that biopsies were taken in a safe manner under fluoroscopic guidance with spot films required.   Once the above was completed, the bronchoscope was switched to the Olympus endobronchial ultrasound scope.  Endobronchial ultrasound was performed to exclude hilar adenopathy which could not be excluded on most recent CT.  The mediastinal and hilar areas where examined with ultrasound.  No abnormal lymphadenopathy was identified. Nothing to biopsy.  Once this was completed, the airway was again inspected.  Hemostasis was excellent.  At this point the bronchoscope was retrieved.  The patient was allowed to emerge from general anesthesia and was extubated in the procedure room without sequela.  Patient tolerated the procedure  well.  She was taken to the PACU  in satisfactory condition.  Complications: None, postprocedure chest x-ray showed no pneumothorax.  Mild blushing of the lesion consistent with post biopsy contained hemorrhage.  Estimated Blood Loss: minimal < 5 mL.   Assessment and Plan  1.  Right lower lobe mass: Abnormal in the microscopy that is ill characterized.  Prior percutaneous needle biopsy nondiagnostic.  Await path results.  2.  No mediastinal adenopathy.    Renold Don, MD [facility]

## 2018-07-31 ENCOUNTER — Encounter: Payer: Self-pay | Admitting: Pulmonary Disease

## 2018-07-31 NOTE — Anesthesia Postprocedure Evaluation (Signed)
Anesthesia Post Note  Patient: [name]  Procedure(s) Performed: ELECTROMAGNETIC NAVIGATION BRONCHOSCOPY RIGHT (Right ) ENDOBRONCHIAL ULTRASOUND RIGHT (Right )  Patient location during evaluation: PACU Anesthesia Type: General Level of consciousness: awake and alert Pain management: pain level controlled Vital Signs Assessment: post-procedure vital signs reviewed and stable Respiratory status: spontaneous breathing, nonlabored ventilation, respiratory function stable and patient connected to nasal cannula oxygen Cardiovascular status: blood pressure returned to baseline and stable Postop Assessment: no apparent nausea or vomiting Anesthetic complications: no     Last Vitals:  Vitals:   07/30/18 1522 07/30/18 1559  BP: 111/68 111/69  Pulse: 82 77  Resp: 16 17  Temp: 37 C 37.2 C  SpO2: 95% 97%    Last Pain:  Vitals:   07/31/18 0813  TempSrc:   PainSc: 3                  Martha Clan

## 2018-08-01 LAB — CYTOLOGY - NON PAP

## 2018-08-01 LAB — SURGICAL PATHOLOGY

## 2018-08-05 ENCOUNTER — Telehealth: Payer: Self-pay | Admitting: Pulmonary Disease

## 2018-08-05 ENCOUNTER — Ambulatory Visit: Payer: Medicare Other | Admitting: Oncology

## 2018-08-05 NOTE — Telephone Encounter (Signed)
Called and spoke to pt, who is requesting bronch results prior to 08/08/2018 office visit.   LG please advise on results. Thanks

## 2018-08-06 ENCOUNTER — Telehealth: Payer: Self-pay | Admitting: Oncology

## 2018-08-06 ENCOUNTER — Other Ambulatory Visit: Payer: Self-pay

## 2018-08-06 NOTE — Telephone Encounter (Signed)
Dr. Patsey Berthold spoke to patient.

## 2018-08-07 ENCOUNTER — Ambulatory Visit: Payer: Medicare Other | Admitting: Oncology

## 2018-08-07 ENCOUNTER — Ambulatory Visit: Payer: Medicare Other | Admitting: Radiation Oncology

## 2018-08-07 ENCOUNTER — Inpatient Hospital Stay: Payer: Medicare Other | Attending: Oncology | Admitting: Oncology

## 2018-08-07 ENCOUNTER — Encounter: Payer: Self-pay | Admitting: Oncology

## 2018-08-07 ENCOUNTER — Other Ambulatory Visit: Payer: Self-pay

## 2018-08-07 ENCOUNTER — Telehealth: Payer: Self-pay | Admitting: Pulmonary Disease

## 2018-08-07 DIAGNOSIS — Z7189 Other specified counseling: Secondary | ICD-10-CM

## 2018-08-07 DIAGNOSIS — F17210 Nicotine dependence, cigarettes, uncomplicated: Secondary | ICD-10-CM | POA: Diagnosis not present

## 2018-08-07 DIAGNOSIS — R918 Other nonspecific abnormal finding of lung field: Secondary | ICD-10-CM

## 2018-08-07 DIAGNOSIS — Z79899 Other long term (current) drug therapy: Secondary | ICD-10-CM

## 2018-08-07 DIAGNOSIS — R911 Solitary pulmonary nodule: Secondary | ICD-10-CM

## 2018-08-07 NOTE — Progress Notes (Signed)
Pt spoke to pulmonary about results and they wanted her to have surgery and she is not stroing enough for that but agreed to see chrystal for poss. radiation

## 2018-08-08 ENCOUNTER — Ambulatory Visit: Payer: Medicare Other | Admitting: Pulmonary Disease

## 2018-08-08 ENCOUNTER — Encounter: Payer: Self-pay | Admitting: Pulmonary Disease

## 2018-08-08 ENCOUNTER — Other Ambulatory Visit: Payer: Self-pay

## 2018-08-08 VITALS — BP 132/80 | HR 90 | Ht 63.0 in | Wt 129.8 lb

## 2018-08-08 DIAGNOSIS — R918 Other nonspecific abnormal finding of lung field: Secondary | ICD-10-CM | POA: Diagnosis not present

## 2018-08-08 DIAGNOSIS — J449 Chronic obstructive pulmonary disease, unspecified: Secondary | ICD-10-CM | POA: Diagnosis not present

## 2018-08-08 DIAGNOSIS — F17210 Nicotine dependence, cigarettes, uncomplicated: Secondary | ICD-10-CM | POA: Diagnosis not present

## 2018-08-08 NOTE — Progress Notes (Signed)
Subjective:    Patient ID: Latoya Lynch, female    DOB: [birth year], 78 y.o.   MRN: [id number]  HPI Patient is a 78 year old current smoker (half a pack of cigarettes per day) presents for follow-up of a right lower lobe mass. The mass was found in September 2019 during a CT for evaluation of potential aortic aneurysm. The mass was studied with PET CT on 13 December 2017 that showed mild hypermetabolic activity. She underwent biopsy of the mass via percutaneous means on 13 January 2018. The mass bled significantly during the procedure. Patient also described hemoptysis after the procedure. Per radiology, the question of whether this was an AVM arose. We initially saw her on 27 March 2018. She was referred for potential navigational bronchoscopy. She was reluctant to undergo another procedure however, was willing to study the mass more carefully to exclude potential AVM. She has had a PE protocol CT chest that showed no evidence of the mass being an AVM. Of note the mass has been noted to increase in size 2 mm since the first CT in September 2019. Patient also underwent 2D echo with bubble contrast and this was not consistent with an intrapulmonary shunt. 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 This was discussed with the patient at her follow-up visit of 19 February.   That time she was still reluctant to undergo another procedure but was leaning towards having navigational bronchoscopy rather than VATS or lobectomy.  Patient continued to postpone her procedure till the end of March.  In the meantime we had starting to have issues with the COVID-19 pandemic and her case was delayed further.  She underwent ENB on 30 Jul 2018.  The endomicroscopy (Cellvizio) was consistent with malignancy however the final pathology was inconclusive.  She remains adamant that she does not want to undergo VATS.  I still recommend this as the next step however she declines this  vehemently.  She would like to be considered for SBRT given that the mass is FDG avid by PET.  I am certain this malignancy by endo-microscopy imaging.  She follows up today after her bronchoscopy.  She has not had any untoward issue since the procedure.  She tolerated the procedure very well.  Review of Systems  Constitutional: Negative.   HENT: Negative.   Eyes: Negative.   Respiratory: Negative.   Cardiovascular: Negative.   Gastrointestinal: Negative.   Neurological: Negative.   Hematological: Negative.   All other systems reviewed and are negative.      Objective:   Physical Exam Vitals signs and nursing note reviewed.  Constitutional:      General: She is not in acute distress.    Appearance: Normal appearance. She is normal weight. She is not ill-appearing.  HENT:     Head: Normocephalic and atraumatic.     Comments: Nose/mouth examination limited due to mask requirement during COVID-19 endemic Eyes:     General: No scleral icterus.    Conjunctiva/sclera: Conjunctivae normal.     Pupils: Pupils are equal, round, and reactive to light.  Neck:     Musculoskeletal: Neck supple.     Thyroid: No thyromegaly.     Trachea: Trachea and phonation normal.  Cardiovascular:     Rate and Rhythm: Normal rate and regular rhythm.     Pulses: Normal pulses.     Heart sounds: Normal heart sounds. No murmur.  Pulmonary:     Effort: Pulmonary effort is normal.     Breath  sounds: Normal breath sounds.  Abdominal:     General: Abdomen is flat.  Musculoskeletal:     Right lower leg: No edema.     Left lower leg: No edema.  Lymphadenopathy:     Cervical: No cervical adenopathy.  Skin:    General: Skin is warm and dry.  Neurological:     General: No focal deficit present.     Mental Status: She is alert and oriented to person, place, and time.  Psychiatric:        Mood and Affect: Mood normal.        Behavior: Behavior normal.           Assessment & Plan:  1. Mass of the  lower lobe of the right lung: This is an enlarging mass, it is FDG avid but has low avidity. The differential diagnoses include malignancy,AVM has been ruled out by PE protocol CT and no evidence of  intrapulmonary shunting on echocardiogram.  The patient underwent navigational bronchoscopy on 27 May, the results of the pathology were inconclusive however, the Endo microscopy images were consistent with malignancy of uncertain type.  This is the second biopsy of the patient has of this mass first 1 was performed by percutaneous means.  It was negative due to hemorrhage during sampling.  At this point I have recommended that the patient undergo video-assisted thoracic surgery however, the patient is very adamant that she does not want any surgical intervention.  She is to have an evaluation by Dr. Noreene Filbert of radiation oncology to consider SBRT to this mass.  We will see her in follow-up in 6 months time.  She is to contact us prior to that time should any new difficulties arise.  2.COPD stage II by GOLD classification:She is doing well with Anoro Ellipta.Continue the same.  3. Tobacco dependence due to cigarettes:Patient was counseled with regards to discontinuation of smoking total counseling time 3 to 5 minutes. I have advised her that SBRT is more effective if she discontinues tobacco use.    This chart was dictated using voice recognition software/Dragon.  Despite best efforts to proofread, errors can occur which can change the meaning.  Any change was purely unintentional.

## 2018-08-08 NOTE — Patient Instructions (Signed)
Follow up in 6 months 

## 2018-08-08 NOTE — Telephone Encounter (Signed)
Nothing further needed 

## 2018-08-10 NOTE — Progress Notes (Signed)
I connected with Latoya Lynch on 08/10/18 at  1:30 PM EDT by video enabled telemedicine visit and verified that I am speaking with the correct person using two identifiers.    There were problems in connection during video visit and it had to be switched to telephone visit.   I discussed the limitations, risks, security and privacy concerns of performing an evaluation and management service by telemedicine and the availability of in-person appointments. I also discussed with the patient that there may be a patient responsible charge related to this service. The patient expressed understanding and agreed to proceed.  Other persons participating in the visit and their role in the encounter:  none  Patient's location:  home Provider's location:  home  Chief Complaint:  Discuss lung biopsy results and further management  History of present illness: Patient is a 78 year old female with a 30-pack-year smoking.  She underwent CT chest in September 2019 which showed 1.7 x 1.1 x 1.3 cm spiculated mass at the right lung base suspicious for primary lung cancer.  PET scan in October 2019 showed hypermetabolism with an SUV of 5 in that area.  No other hypermetabolic them elsewhere.  Patient then had a CT-guided biopsy which did not reveal any malignancy.  It showed benign alveolar related lung parenchyma with associated blood and organizing fibrin.  She also met with Dr. Genevive Bi but did not wish to proceed with definitive surgery for her right lower lobe lung nodule.  Case was discussed at tumor board and plan was to get a repeat bronchoscopy guided biopsy.  Patient underwent a CT super D chest without contrast on 07/09/2018 which showed spiculated irregular solid 2.1 x 1.9 cm mass which was increased from 1.9 x 1.6 cm from prior imaging in March 2020.  This is suspicious for primary bronchogenic carcinoma.  Patient underwent bronchoscopy guided biopsy which showed benign alveolar related lung parenchyma  with hemorrhage.  1 of the cytology specimens did show atypical cells suspicious for but not diagnostic for malignancy.  Endosonographic appearance at the time of bronchoscopy however was suspicious for malignancy.   Interval history : Patient is doing well since her bronchoscopy and denies any new complaints at this time   Review of Systems  Constitutional: Negative for chills, fever, malaise/fatigue and weight loss.  HENT: Negative for congestion, ear discharge and nosebleeds.   Eyes: Negative for blurred vision.  Respiratory: Negative for cough, hemoptysis, sputum production, shortness of breath and wheezing.   Cardiovascular: Negative for chest pain, palpitations, orthopnea and claudication.  Gastrointestinal: Negative for abdominal pain, blood in stool, constipation, diarrhea, heartburn, melena, nausea and vomiting.  Genitourinary: Negative for dysuria, flank pain, frequency, hematuria and urgency.  Musculoskeletal: Negative for back pain, joint pain and myalgias.  Skin: Negative for rash.  Neurological: Negative for dizziness, tingling, focal weakness, seizures, weakness and headaches.  Endo/Heme/Allergies: Does not bruise/bleed easily.  Psychiatric/Behavioral: Negative for depression and suicidal ideas. The patient does not have insomnia.     Allergies  Allergen Reactions  . Other Anaphylaxis    All nuts  . Peanut Oil Anaphylaxis  . Prochlorperazine Anaphylaxis    Past Medical History:  Diagnosis Date  . COPD (chronic obstructive pulmonary disease) ([place])   . Depression   . History of methicillin resistant staphylococcus aureus (MRSA)   . Hyperlipidemia     Past Surgical History:  Procedure Laterality Date  . CATARACT EXTRACTION W/ INTRAOCULAR LENS  IMPLANT, BILATERAL    . 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  ELECTROMAGNETIC NAVIGATION BROCHOSCOPY Right 07/30/2018   Procedure: ELECTROMAGNETIC NAVIGATION BRONCHOSCOPY RIGHT;  Surgeon: Tyler Pita, MD;  Location:  [facility] ORS;  Service: Cardiopulmonary;  Laterality: Right;  . ENDOBRONCHIAL ULTRASOUND Right 07/30/2018   Procedure: ENDOBRONCHIAL ULTRASOUND RIGHT;  Surgeon: Tyler Pita, MD;  Location: [facility] ORS;  Service: Cardiopulmonary;  Laterality: Right;  . retinal detatchment Right 05/2017  . TONSILLECTOMY    . TOTAL HIP ARTHROPLASTY Right 01/2015  . TUBAL LIGATION      Social History   Socioeconomic History  . Marital status: Widowed    Spouse name: Not on file  . Number of children: Not on file  . Years of education: Not on file  . Highest education level: Not on file  Occupational History  . Not on file  Social Needs  . Financial resource strain: Not on file  . Food insecurity:    Worry: Not on file    Inability: Not on file  . Transportation needs:    Medical: Not on file    Non-medical: Not on file  Tobacco Use  . Smoking status: Current Every Day Smoker    Packs/day: 0.50    Years: 60.00    Pack years: 30.00    Types: Cigarettes  . Smokeless tobacco: Never Used  . Tobacco comment: about 15 a day  Substance and Sexual Activity  . Alcohol use: Not Currently  . Drug use: Never  . Sexual activity: Not on file  Lifestyle  . Physical activity:    Days per week: Not on file    Minutes per session: Not on file  . Stress: Not on file  Relationships  . Social connections:    Talks on phone: Not on file    Gets together: Not on file    Attends religious service: Not on file    Active member of club or organization: Not on file    Attends meetings of clubs or organizations: Not on file    Relationship status: Not on file  . Intimate partner violence:    Fear of current or ex partner: Not on file    Emotionally abused: Not on file    Physically abused: Not on file    Forced sexual activity: Not on file  Other Topics Concern  . Not on file  Social History Narrative  . Not on file    Family History  Problem Relation Age of Onset  . Heart attack Mother   . Cancer  Maternal Grandmother 80       spine ca     Current Outpatient Medications:  .  buPROPion (WELLBUTRIN SR) 150 MG 12 hr tablet, Take 150 mg by mouth 2 (two) times daily. , Disp: , Rfl:  .  EPINEPHrine (EPIPEN 2-PAK) 0.3 mg/0.3 mL IJ SOAJ injection, Inject 0.3 mg into the muscle as needed for anaphylaxis., Disp: , Rfl:  .  loratadine (CLARITIN) 10 MG tablet, Take 10 mg by mouth every morning. , Disp: , Rfl:  .  naproxen sodium (ALEVE) 220 MG tablet, Take 440 mg by mouth every morning. , Disp: , Rfl:  .  Omega-3 1000 MG CAPS, Take 1,000 mg by mouth daily. , Disp: , Rfl:  .  riboflavin (VITAMIN B-2) 100 MG TABS tablet, Take 200 mg by mouth daily., Disp: , Rfl:  .  umeclidinium-vilanterol (ANORO ELLIPTA) 62.5-25 MCG/INH AEPB, Inhale 1 puff into the lungs daily. (Patient taking differently: Inhale 1 puff into the lungs every morning. ), Disp: 1 each, Rfl:  6  Current Facility-Administered Medications:  .  umeclidinium-vilanterol (ANORO ELLIPTA) 62.5-25 MCG/INH 1 puff, 1 puff, Inhalation, Daily, Tyler Pita, MD  Dg Chest Port 1 View  Result Date: 07/30/2018 CLINICAL DATA:  Post bronchoscopy. EXAM: PORTABLE CHEST 1 VIEW COMPARISON:  Chest x-ray dated 01/13/2018. FINDINGS: There is no right-sided pneumothorax. The patient's known right lower lobe lung mass is best visualized on prior CT. There may be some mild interval increase in surrounding soft tissue attenuation which is favored to represent expected postoperative hemorrhage. Heart size is normal. Aortic calcifications are noted. Chronic lung changes are noted bilaterally. IMPRESSION: 1. No pneumothorax. 2. Right lower lobe lung mass again visualized. There is slight interval increase in surrounding soft tissue attenuation favored to represent expected post biopsy hemorrhage. Electronically Signed   By: Constance Holster M.D.   On: 07/30/2018 14:46   Dg Fluoro Rm 1-60 Min - No Report  Result Date: 07/30/2018 Fluoroscopy was utilized by the  requesting physician.  No radiographic interpretation.    No images are attached to the encounter.   CMP Latest Ref Rng & Units 05/16/2018  Glucose 70 - 99 mg/dL 102(H)  BUN 8 - 23 mg/dL 20  Creatinine 0.44 - 1.00 mg/dL 0.79  Sodium 135 - 145 mmol/L 141  Potassium 3.5 - 5.1 mmol/L 3.8  Chloride 98 - 111 mmol/L 107  CO2 22 - 32 mmol/L 25  Calcium 8.9 - 10.3 mg/dL 9.0  Total Protein 6.5 - 8.1 g/dL -  Total Bilirubin 0.3 - 1.2 mg/dL -  Alkaline Phos 38 - 126 U/L -  AST 15 - 41 U/L -  ALT 0 - 44 U/L -   CBC Latest Ref Rng & Units 05/16/2018  WBC 4.0 - 10.5 K/uL 6.7  Hemoglobin 12.0 - 15.0 g/dL 14.2  Hematocrit 36.0 - 46.0 % 42.1  Platelets 150 - 400 K/uL 201     Assessment and plan: Patient is a 78 year old female with a slowly growing right lower lobe lung nodule concerning for primary bronchogenic carcinoma  Right lower lobe lung nodule has shown a consistent slow-growing pattern over the last 6 months.  Bronchoscopy and cytology showed atypical cells which were concerning but nondiagnostic for malignancy.  However the appearance of the lesion at the time of bronchoscopy per Dr. Patsey Berthold was still concerning for malignancy.  I discussed with the patient that at this time she has 2 options: She could see Dr. Genevive Bi and discuss surgical right lower lobectomy versus SBRT to the lesion.  Patient does not wish to proceed with any definitive surgery at this time.  She was supposed to see Dr. Donella Stade today but because of computer issues this has been rescheduled for next week.  Discussed that for lesion of less than 4 cm I would not recommend any concurrent chemotherapy at this time and SBRT alone would be a reasonable option.  Treatment will be given with a curative intent.  Would need surveillance scans every 3 to 6 months.  Follow-up instructions: I will see her back end of August 2020.  CT chest prior  I discussed the assessment and treatment plan with the patient. The patient was  provided an opportunity to ask questions and all were answered. The patient agreed with the plan and demonstrated an understanding of the instructions.   The patient was advised to call back or seek an in-person evaluation if the symptoms worsen or if the condition fails to improve as anticipated.    Visit Diagnosis: 1. Lung nodule  2. Goals of care, counseling/discussion     Dr. Randa Evens, MD, MPH [facility] [phone] 08/10/2018 12:42 PM

## 2018-08-12 ENCOUNTER — Other Ambulatory Visit: Payer: Self-pay

## 2018-08-12 ENCOUNTER — Encounter: Payer: Self-pay | Admitting: *Deleted

## 2018-08-12 ENCOUNTER — Encounter: Payer: Self-pay | Admitting: Radiation Oncology

## 2018-08-12 ENCOUNTER — Ambulatory Visit
Admission: RE | Admit: 2018-08-12 | Discharge: 2018-08-12 | Disposition: A | Discharge status: Home / Self Care | Payer: Medicare Other | Source: Ambulatory Visit | Attending: Radiation Oncology | Admitting: Radiation Oncology

## 2018-08-12 VITALS — BP 136/77 | HR 89 | Temp 98.7°F | Resp 18 | Wt 130.4 lb

## 2018-08-12 DIAGNOSIS — R05 Cough: Secondary | ICD-10-CM | POA: Diagnosis not present

## 2018-08-12 DIAGNOSIS — R911 Solitary pulmonary nodule: Secondary | ICD-10-CM | POA: Insufficient documentation

## 2018-08-12 NOTE — Progress Notes (Signed)
Radiation Oncology Follow up Note  Name: Latoya Lynch   Date:   08/12/2018 MRN:  [id number] DOB: 1941-03-03    This 78 y.o. 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  REFERRING PROVIDER: Jodi Marble, MD  HPI: Patient is a 78 year old female who we have been tracking and glargine right lower lobe lung mass which initially was PET positive.  I saw her back in December 2019 we decided to.  Follow.  CT scan in May showed interval growth of spiculated mass now measuring 2.1 cm again highly suspicious for primary bronchogenic carcinoma.  Patient underwent virtual bronchoscopy and 1 of the cytology specimens showed atypical cells highly suspicious but not diagnostic of malignancy.  Patient continues a mild cough.  She specifically denies hemoptysis.  At this time patient favors going ahead with treatment.  COMPLICATIONS OF TREATMENT: none  FOLLOW UP COMPLIANCE: keeps appointments   PHYSICAL EXAM:  BP 136/77   Pulse 89   Temp 98.7 F (37.1 C)   Resp 18   Wt 130 lb 6.4 oz (59.1 kg)   BMI 23.10 kg/m  Well-developed well-nourished patient in NAD. HEENT reveals PERLA, EOMI, discs not visualized.  Oral cavity is clear. No oral mucosal lesions are identified. Neck is clear without evidence of cervical or supraclavicular adenopathy. Lungs are clear to A&P. Cardiac examination is essentially unremarkable with regular rate and rhythm without murmur rub or thrill. Abdomen is benign with no organomegaly or masses noted. Motor sensory and DTR levels are equal and symmetric in the upper and lower extremities. Cranial nerves II through XII are grossly intact. Proprioception is intact. No peripheral adenopathy or edema is identified. No motor or sensory levels are noted. Crude visual fields are within normal range.  RADIOLOGY RESULTS: CT scans and PET CT scans reviewed Cytology reports reviewed  PLAN: At  this time elected ahead with SBRT to her right lower lobe lesion.  Would plan on delivering 6000 cGy in 5 fractions.  Risks and benefits of treatment including development of a cough problems possible skin reaction possible fatigue.  I would use for dimensional treatment planning as well as motion restriction for CT simulation.  I have personally set up and ordered CT simulation for later this week.  Patient comprehends my treatment plan well.  Nurse navigator was present throughout our discussion.  I would like to take this opportunity to thank you for allowing me to participate in the care of your patient.Noreene Filbert, MD

## 2018-08-12 NOTE — Progress Notes (Signed)
  Oncology Nurse Navigator Documentation  Navigator Location: [facility] (08/12/18 1400)   )Navigator Encounter Type: Follow-up Appt (08/12/18 1400)                     Patient Visit Type: RadOnc (08/12/18 1400) Treatment Phase: Pre-Tx/Tx Discussion (08/12/18 1400) Barriers/Navigation Needs: No barriers at this time (08/12/18 1400)   Interventions: None required (08/12/18 1400)         Met with patient during follow up visit with Dr. Baruch Gouty to discuss radiation. All questions answered during visit. Reviewed upcoming appts with patient. Instructed pt to call with any further questions or needs. Pt verbalized understanding. Nothing further needed at this time.             Time Spent with Patient: 30 (08/12/18 1400)

## 2018-08-13 ENCOUNTER — Other Ambulatory Visit: Payer: Self-pay

## 2018-08-13 ENCOUNTER — Ambulatory Visit
Admission: RE | Admit: 2018-08-13 | Discharge: 2018-08-13 | Disposition: A | Discharge status: Home / Self Care | Payer: Medicare Other | Source: Ambulatory Visit | Attending: Radiation Oncology | Admitting: Radiation Oncology

## 2018-08-13 DIAGNOSIS — R918 Other nonspecific abnormal finding of lung field: Secondary | ICD-10-CM | POA: Diagnosis not present

## 2018-08-13 DIAGNOSIS — Z510 Encounter for antineoplastic radiation therapy: Secondary | ICD-10-CM | POA: Insufficient documentation

## 2018-08-14 DIAGNOSIS — R918 Other nonspecific abnormal finding of lung field: Secondary | ICD-10-CM | POA: Diagnosis not present

## 2018-08-25 ENCOUNTER — Other Ambulatory Visit: Payer: Self-pay

## 2018-08-25 ENCOUNTER — Encounter: Payer: Self-pay | Admitting: *Deleted

## 2018-08-25 ENCOUNTER — Ambulatory Visit
Admission: RE | Admit: 2018-08-25 | Discharge: 2018-08-25 | Disposition: A | Discharge status: Home / Self Care | Payer: Medicare Other | Source: Ambulatory Visit | Attending: Radiation Oncology | Admitting: Radiation Oncology

## 2018-08-25 DIAGNOSIS — R918 Other nonspecific abnormal finding of lung field: Secondary | ICD-10-CM | POA: Diagnosis not present

## 2018-08-25 NOTE — Progress Notes (Signed)
  Oncology Nurse Navigator Documentation  Navigator Location: [facility] (08/25/18 1100)   )Navigator Encounter Type: Lobby (08/25/18 1100)                   Treatment Initiated Date: 08/25/18 (08/25/18 1100) Patient Visit Type: EKBTCY (08/25/18 1100) Treatment Phase: First Radiation Tx (08/25/18 1100) Barriers/Navigation Needs: No Barriers At This Time (08/25/18 1100)   Interventions: None Required (08/25/18 1100)             met with patient prior to first radiation treatment today. Pt has no questions or concerns. Reviewed upcoming appts. Instructed pt to call with any further questions or needs.         Time Spent with Patient: 15 (08/25/18 1100)

## 2018-08-27 ENCOUNTER — Ambulatory Visit
Admission: RE | Admit: 2018-08-27 | Discharge: 2018-08-27 | Disposition: A | Discharge status: Home / Self Care | Payer: Medicare Other | Source: Ambulatory Visit | Attending: Radiation Oncology | Admitting: Radiation Oncology

## 2018-08-27 ENCOUNTER — Other Ambulatory Visit: Payer: Self-pay

## 2018-08-27 DIAGNOSIS — R918 Other nonspecific abnormal finding of lung field: Secondary | ICD-10-CM | POA: Diagnosis not present

## 2018-09-01 ENCOUNTER — Other Ambulatory Visit: Payer: Self-pay

## 2018-09-01 ENCOUNTER — Ambulatory Visit
Admission: RE | Admit: 2018-09-01 | Discharge: 2018-09-01 | Disposition: A | Discharge status: Home / Self Care | Payer: Medicare Other | Source: Ambulatory Visit | Attending: Radiation Oncology | Admitting: Radiation Oncology

## 2018-09-01 DIAGNOSIS — R918 Other nonspecific abnormal finding of lung field: Secondary | ICD-10-CM | POA: Diagnosis not present

## 2018-09-03 ENCOUNTER — Ambulatory Visit
Admission: RE | Admit: 2018-09-03 | Discharge: 2018-09-03 | Disposition: A | Discharge status: Home / Self Care | Payer: Medicare Other | Source: Ambulatory Visit | Attending: Radiation Oncology | Admitting: Radiation Oncology

## 2018-09-03 ENCOUNTER — Other Ambulatory Visit: Payer: Self-pay

## 2018-09-03 DIAGNOSIS — R918 Other nonspecific abnormal finding of lung field: Secondary | ICD-10-CM | POA: Diagnosis not present

## 2018-09-03 DIAGNOSIS — Z510 Encounter for antineoplastic radiation therapy: Secondary | ICD-10-CM | POA: Insufficient documentation

## 2018-09-08 ENCOUNTER — Encounter: Payer: Self-pay | Admitting: Pulmonary Disease

## 2018-09-08 ENCOUNTER — Other Ambulatory Visit: Payer: Self-pay

## 2018-09-08 ENCOUNTER — Ambulatory Visit
Admission: RE | Admit: 2018-09-08 | Discharge: 2018-09-08 | Disposition: A | Discharge status: Home / Self Care | Payer: Medicare Other | Source: Ambulatory Visit | Attending: Radiation Oncology | Admitting: Radiation Oncology

## 2018-09-08 DIAGNOSIS — R918 Other nonspecific abnormal finding of lung field: Secondary | ICD-10-CM | POA: Diagnosis not present

## 2018-10-23 ENCOUNTER — Ambulatory Visit
Admission: RE | Admit: 2018-10-23 | Discharge: 2018-10-23 | Disposition: A | Discharge status: Home / Self Care | Payer: Medicare Other | Source: Ambulatory Visit | Attending: Oncology | Admitting: Oncology

## 2018-10-23 ENCOUNTER — Other Ambulatory Visit: Payer: Self-pay

## 2018-10-23 DIAGNOSIS — R911 Solitary pulmonary nodule: Secondary | ICD-10-CM | POA: Insufficient documentation

## 2018-10-27 ENCOUNTER — Ambulatory Visit
Admission: RE | Admit: 2018-10-27 | Discharge: 2018-10-27 | Disposition: A | Discharge status: Home / Self Care | Payer: Medicare Other | Source: Ambulatory Visit | Attending: Radiation Oncology | Admitting: Radiation Oncology

## 2018-10-27 ENCOUNTER — Inpatient Hospital Stay: Payer: Medicare Other | Attending: Oncology | Admitting: Oncology

## 2018-10-27 ENCOUNTER — Encounter: Payer: Self-pay | Admitting: Oncology

## 2018-10-27 ENCOUNTER — Other Ambulatory Visit: Payer: Self-pay

## 2018-10-27 VITALS — BP 108/69 | HR 89 | Temp 98.5°F | Resp 16 | Ht 63.0 in | Wt 123.6 lb

## 2018-10-27 DIAGNOSIS — R911 Solitary pulmonary nodule: Secondary | ICD-10-CM

## 2018-10-27 DIAGNOSIS — F329 Major depressive disorder, single episode, unspecified: Secondary | ICD-10-CM | POA: Diagnosis not present

## 2018-10-27 DIAGNOSIS — E785 Hyperlipidemia, unspecified: Secondary | ICD-10-CM | POA: Diagnosis not present

## 2018-10-27 DIAGNOSIS — R918 Other nonspecific abnormal finding of lung field: Secondary | ICD-10-CM | POA: Insufficient documentation

## 2018-10-27 DIAGNOSIS — J449 Chronic obstructive pulmonary disease, unspecified: Secondary | ICD-10-CM | POA: Insufficient documentation

## 2018-10-27 DIAGNOSIS — Z923 Personal history of irradiation: Secondary | ICD-10-CM | POA: Diagnosis not present

## 2018-10-27 DIAGNOSIS — Z79899 Other long term (current) drug therapy: Secondary | ICD-10-CM | POA: Insufficient documentation

## 2018-10-27 DIAGNOSIS — F17210 Nicotine dependence, cigarettes, uncomplicated: Secondary | ICD-10-CM | POA: Insufficient documentation

## 2018-10-27 NOTE — Progress Notes (Signed)
Radiation Oncology Follow up Note  Name: Latoya Lynch   Date:   10/27/2018 MRN:  [id number] DOB: 09-28-1940    This 78 y.o. 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  REFERRING PROVIDER: Jodi Marble, MD  HPI: Patient is a 78 year old female now at 4 months having completed SBRT to her right lower lobe for presumed primary bronchogenic carcinoma.  Seen today in routine follow-up she is doing well.  She specifically denies any cough decreased pulmonary function dyspnea on exertion..  She recently had a CT scan showing interval decrease in the size of irregular right lower lobe pulmonary nodule with no other evidence of disease.  COMPLICATIONS OF TREATMENT: none  FOLLOW UP COMPLIANCE: keeps appointments   PHYSICAL EXAM:  There were no vitals taken for this visit. Well-developed well-nourished patient in NAD. HEENT reveals PERLA, EOMI, discs not visualized.  Oral cavity is clear. No oral mucosal lesions are identified. Neck is clear without evidence of cervical or supraclavicular adenopathy. Lungs are clear to A&P. Cardiac examination is essentially unremarkable with regular rate and rhythm without murmur rub or thrill. Abdomen is benign with no organomegaly or masses noted. Motor sensory and DTR levels are equal and symmetric in the upper and lower extremities. Cranial nerves II through XII are grossly intact. Proprioception is intact. No peripheral adenopathy or edema is identified. No motor or sensory levels are noted. Crude visual fields are within normal range.  RADIOLOGY RESULTS: CT scan reviewed compatible with above-stated findings  PLAN: Patient is currently doing well with excellent results by CT criteria.  I am pleased with her overall progress.  I have asked to see her back in approximately 6 months for follow-up.  Patient continues close follow-up care with medical oncology.  I  will have a CT scan performed if it is not already been performed prior to her next visit.  Patient comprehends my recommendations well.  I would like to take this opportunity to thank you for allowing me to participate in the care of your patient.Noreene Filbert, MD

## 2018-10-27 NOTE — Progress Notes (Signed)
Pt here for scan results. Refer her to ortho for left hip pain for exertion of activities that makes her have more pain.

## 2018-10-27 NOTE — Progress Notes (Signed)
Hematology/Oncology Consult note [facility]  Telephone:([phone] [phone]  Patient Care Team: Jodi Marble, MD as PCP - General (Internal Medicine) Telford Nab, RN as Registered Nurse   Name of the patient: Latoya Lynch  [id number]  1940/06/05   Date of visit: 10/27/18  Diagnosis-right lower lobe lung mass 1.7 cm presumptive diagnosis of lung cancer  Chief complaint/ Reason for visit-routine follow-up of lung cancer to discuss CT scan results  Heme/Onc history: Patient is a 78 year old female with a 30-pack-year smoking. She underwent CT chest in September 2019 which showed 1.7 x 1.1 x 1.3 cm spiculated mass at the right lung base suspicious for primary lung cancer. PET scan in October 2019 showed hypermetabolism with an SUV of 5 in that area. No other hypermetabolic them elsewhere. Patient then had a CT-guided biopsy which did not reveal any malignancy. It showed benign alveolar related lung parenchyma with associated blood and organizing fibrin. She also met with Dr. Genevive Bi but did not wish to proceed with definitive surgery for her right lower lobe lung nodule. Case was discussed at tumor board and plan was to get a repeat bronchoscopy guided biopsy.  Patient underwent a CT super D chest without contrast on 07/09/2018 which showed spiculated irregular solid 2.1 x 1.9 cm mass which was increased from 1.9 x 1.6 cm from prior imaging in March 2020.  This is suspicious for primary bronchogenic carcinoma.  Patient underwent bronchoscopy guided biopsy which showed benign alveolar related lung parenchyma with hemorrhage.  1 of the cytology specimens did show atypical cells suspicious for but not diagnostic for malignancy.  Endosonographic appearance at the time of bronchoscopy however was suspicious for malignancy.  Interval history-reports problems with left hip pain.  She has been having left hip pain over the last few weeks which  makes it difficult for her when she does gardening.  She is able to ambulate without any difficulty.  She feels that her hip is stiff for about 45 minutes before she can start walking normally.  Denies any fevers cough or shortness of breath  ECOG PS- 1 Pain scale- 0   Review of systems- Review of Systems  Constitutional: Positive for malaise/fatigue. Negative for chills, fever and weight loss.  HENT: Negative for congestion, ear discharge and nosebleeds.   Eyes: Negative for blurred vision.  Respiratory: Negative for cough, hemoptysis, sputum production, shortness of breath and wheezing.   Cardiovascular: Negative for chest pain, palpitations, orthopnea and claudication.  Gastrointestinal: Negative for abdominal pain, blood in stool, constipation, diarrhea, heartburn, melena, nausea and vomiting.  Genitourinary: Negative for dysuria, flank pain, frequency, hematuria and urgency.  Musculoskeletal: Positive for joint pain. Negative for back pain and myalgias.  Skin: Negative for rash.  Neurological: Negative for dizziness, tingling, focal weakness, seizures, weakness and headaches.  Endo/Heme/Allergies: Does not bruise/bleed easily.  Psychiatric/Behavioral: Negative for depression and suicidal ideas. The patient does not have insomnia.        Allergies  Allergen Reactions   Other Anaphylaxis    All nuts   Peanut Oil Anaphylaxis   Prochlorperazine Anaphylaxis     Past Medical History:  Diagnosis Date   COPD (chronic obstructive pulmonary disease) (HCC)    Depression    History of methicillin resistant staphylococcus aureus (MRSA)    Hyperlipidemia      Past Surgical History:  Procedure Laterality Date   CATARACT EXTRACTION W/ INTRAOCULAR LENS  IMPLANT, BILATERAL     CEREBRAL ANEURYSM REPAIR  01/2003  ELECTROMAGNETIC NAVIGATION BROCHOSCOPY Right 07/30/2018   Procedure: ELECTROMAGNETIC NAVIGATION BRONCHOSCOPY RIGHT;  Surgeon: Tyler Pita, MD;  Location: [facility]  ORS;  Service: Cardiopulmonary;  Laterality: Right;   ENDOBRONCHIAL ULTRASOUND Right 07/30/2018   Procedure: ENDOBRONCHIAL ULTRASOUND RIGHT;  Surgeon: Tyler Pita, MD;  Location: [facility] ORS;  Service: Cardiopulmonary;  Laterality: Right;   retinal detatchment Right 05/2017   TONSILLECTOMY     TOTAL HIP ARTHROPLASTY Right 01/2015   TUBAL LIGATION      Social History   Socioeconomic History   Marital status: Widowed    Spouse name: Not on file   Number of children: Not on file   Years of education: Not on file   Highest education level: Not on file  Occupational History   Not on file  Social Needs   Financial resource strain: Not on file   Food insecurity    Worry: Not on file    Inability: Not on file   Transportation needs    Medical: Not on file    Non-medical: Not on file  Tobacco Use   Smoking status: Current Every Day Smoker    Packs/day: 0.50    Years: 60.00    Pack years: 30.00    Types: Cigarettes   Smokeless tobacco: Never Used   Tobacco comment: about 15 a day  Substance and Sexual Activity   Alcohol use: Not Currently    Comment: once in a great while   Drug use: Never   Sexual activity: Not Currently  Lifestyle   Physical activity    Days per week: Not on file    Minutes per session: Not on file   Stress: Not on file  Relationships   Social connections    Talks on phone: Not on file    Gets together: Not on file    Attends religious service: Not on file    Active member of club or organization: Not on file    Attends meetings of clubs or organizations: Not on file    Relationship status: Not on file   Intimate partner violence    Fear of current or ex partner: Not on file    Emotionally abused: Not on file    Physically abused: Not on file    Forced sexual activity: Not on file  Other Topics Concern   Not on file  Social History Narrative   Not on file    Family History  Problem Relation Age of Onset   Heart  attack Mother    Cancer Maternal Grandmother 43       spine ca   Heart attack Maternal Grandfather      Current Outpatient Medications:    buPROPion (WELLBUTRIN SR) 150 MG 12 hr tablet, Take 150 mg by mouth 2 (two) times daily. , Disp: , Rfl:    EPINEPHrine (EPIPEN 2-PAK) 0.3 mg/0.3 mL IJ SOAJ injection, Inject 0.3 mg into the muscle as needed for anaphylaxis., Disp: , Rfl:    loratadine (CLARITIN) 10 MG tablet, Take 10 mg by mouth every morning. , Disp: , Rfl:    naproxen sodium (ALEVE) 220 MG tablet, Take 440 mg by mouth every morning. , Disp: , Rfl:    Omega-3 1000 MG CAPS, Take 1,000 mg by mouth daily. , Disp: , Rfl:    Pitavastatin Calcium (LIVALO) 2 MG TABS, Take 2 mg by mouth daily., Disp: , Rfl:    riboflavin (VITAMIN B-2) 100 MG TABS tablet, Take 200 mg by mouth daily., Disp: ,  Rfl:    umeclidinium-vilanterol (ANORO ELLIPTA) 62.5-25 MCG/INH AEPB, Inhale 1 puff into the lungs daily. (Patient taking differently: Inhale 1 puff into the lungs every morning. ), Disp: 1 each, Rfl: 6  Current Facility-Administered Medications:    umeclidinium-vilanterol (ANORO ELLIPTA) 62.5-25 MCG/INH 1 puff, 1 puff, Inhalation, Daily, Tyler Pita, MD  Physical exam:  Vitals:   10/27/18 1313  BP: 108/69  Pulse: 89  Resp: 16  Temp: 98.5 F (36.9 C)  TempSrc: Tympanic  Weight: 123 lb 9.6 oz (56.1 kg)  Height: 5' 3"  (1.6 m)   Physical Exam HENT:     Head: Normocephalic and atraumatic.  Eyes:     Pupils: Pupils are equal, round, and reactive to light.  Neck:     Musculoskeletal: Normal range of motion.  Cardiovascular:     Rate and Rhythm: Normal rate and regular rhythm.     Heart sounds: Normal heart sounds.  Pulmonary:     Effort: Pulmonary effort is normal.     Breath sounds: Normal breath sounds.  Abdominal:     General: Bowel sounds are normal.     Palpations: Abdomen is soft.  Skin:    General: Skin is warm and dry.  Neurological:     Mental Status: She is  alert and oriented to person, place, and time.      CMP Latest Ref Rng & Units 05/16/2018  Glucose 70 - 99 mg/dL 102(H)  BUN 8 - 23 mg/dL 20  Creatinine 0.44 - 1.00 mg/dL 0.79  Sodium 135 - 145 mmol/L 141  Potassium 3.5 - 5.1 mmol/L 3.8  Chloride 98 - 111 mmol/L 107  CO2 22 - 32 mmol/L 25  Calcium 8.9 - 10.3 mg/dL 9.0  Total Protein 6.5 - 8.1 g/dL -  Total Bilirubin 0.3 - 1.2 mg/dL -  Alkaline Phos 38 - 126 U/L -  AST 15 - 41 U/L -  ALT 0 - 44 U/L -   CBC Latest Ref Rng & Units 05/16/2018  WBC 4.0 - 10.5 K/uL 6.7  Hemoglobin 12.0 - 15.0 g/dL 14.2  Hematocrit 36.0 - 46.0 % 42.1  Platelets 150 - 400 K/uL 201    No images are attached to the encounter.  Ct Chest Wo Contrast  Result Date: 10/23/2018 CLINICAL DATA:  Pre-procedure planning. EXAM: CT CHEST WITHOUT CONTRAST TECHNIQUE: Multidetector CT imaging of the chest was performed following the standard protocol without IV contrast. COMPARISON:  07/09/2018 FINDINGS: Cardiovascular: The heart size is normal. No substantial pericardial effusion. Coronary artery calcification is evident. Atherosclerotic calcification is noted in the wall of the thoracic aorta. Mediastinum/Nodes: No mediastinal lymphadenopathy. No evidence for gross hilar lymphadenopathy although assessment is limited by the lack of intravenous contrast on today's study. Tiny hiatal hernia. The esophagus has normal imaging features. There is no axillary lymphadenopathy. Lungs/Pleura: Left greater than right biapical pleuroparenchymal scarring is similar. Centrilobular and paraseptal emphysema evident. Interval decrease in right lower lobe pulmonary nodule, now measuring 1.3 x 0.8 cm compared to 2.1 x 1.9 cm previously. Nodule is incorporated into a linear band atelectasis or scar. No new suspicious pulmonary nodule or mass. No focal airspace consolidation. No pleural effusion. Upper Abdomen: Unremarkable. Musculoskeletal: No worrisome lytic or sclerotic osseous abnormality.  IMPRESSION: 1. Interval decrease in size of the irregular right lower lobe pulmonary nodule no incorporated into a linear band of atelectasis or scar. 2. Otherwise stable exam. 3.  Aortic Atherosclerois (ICD10-170.0) 4.  Emphysema. ([id number]-G40.9) Electronically Signed   By: Misty Stanley  M.D.   On: 10/23/2018 14:49     Assessment and plan- Patient is a 78 y.o. female with 2 cm right lower lobe lung mass with presumed diagnosis of lung cancer status post SBRT  I have reviewed CT chest images independently and discussed findings with the patient.  Overall the right lower lobe Residual lung mass is smaller in size after radiation from two-point centimeter down to 1.3 cm.  Discussed that this could be an active tumor/scarring which can be monitored conservatively.  I will see her back in 6 months time with CT chest without contrast prior   Visit Diagnosis 1. Nodule of right lung   2. Mass of lower lobe of right lung      Dr. Randa Evens, MD, MPH [facility] at [facility] [phone] 10/27/2018 1:22 PM

## 2018-10-28 ENCOUNTER — Other Ambulatory Visit: Payer: Self-pay | Admitting: Pulmonary Disease

## 2019-01-13 ENCOUNTER — Telehealth: Payer: Self-pay | Admitting: Pulmonary Disease

## 2019-01-13 NOTE — Telephone Encounter (Signed)
Pt has pending CT for 04/22/2018 ordered by Dr. Janese Banks.  Pt is questioning if a CT is needed prior to following up with Dr. Patsey Berthold 02/17/2019.  LG please advise on CT. Thank you.

## 2019-01-13 NOTE — Telephone Encounter (Signed)
Left message for pt

## 2019-01-13 NOTE — Telephone Encounter (Signed)
Her CT is now being directed by oncology as they are following results of therapy.  If she is doing well we can see her in February after her CT.  Otherwise we will see her in December if she needs to be seen sooner.

## 2019-01-14 NOTE — Telephone Encounter (Signed)
Patient called back - pt states she is doing well - pt states okay to cancel appt in December. Placed recall in pt chart to call to schedule f/u with Dr. Patsey Berthold in February - pt needed nothing further-pr

## 2019-02-17 ENCOUNTER — Ambulatory Visit: Payer: Medicare Other | Admitting: Pulmonary Disease

## 2019-03-17 ENCOUNTER — Telehealth: Payer: Self-pay | Admitting: *Deleted

## 2019-03-17 NOTE — Telephone Encounter (Signed)
Referral faxed to [facility].  ?

## 2019-03-17 NOTE — Telephone Encounter (Signed)
Pt called in to report that pain in left hip has been getting worse since last visit. Asking if she can be referred to local orthopedic for further evaluation.   Please advise.

## 2019-03-17 NOTE — Telephone Encounter (Signed)
Ok to refer to orthopedics. But she should inform her pcp

## 2019-03-30 DIAGNOSIS — M25559 Pain in unspecified hip: Secondary | ICD-10-CM | POA: Insufficient documentation

## 2019-03-30 DIAGNOSIS — R269 Unspecified abnormalities of gait and mobility: Secondary | ICD-10-CM | POA: Insufficient documentation

## 2019-03-30 DIAGNOSIS — M25552 Pain in left hip: Secondary | ICD-10-CM | POA: Insufficient documentation

## 2019-04-23 ENCOUNTER — Ambulatory Visit: Payer: Medicare Other

## 2019-04-28 ENCOUNTER — Ambulatory Visit
Admission: RE | Admit: 2019-04-28 | Discharge: 2019-04-28 | Disposition: A | Discharge status: Home / Self Care | Payer: Medicare Other | Source: Ambulatory Visit | Attending: Oncology | Admitting: Oncology

## 2019-04-28 ENCOUNTER — Other Ambulatory Visit: Payer: Self-pay

## 2019-04-28 DIAGNOSIS — R911 Solitary pulmonary nodule: Secondary | ICD-10-CM | POA: Diagnosis not present

## 2019-04-29 ENCOUNTER — Other Ambulatory Visit: Payer: Self-pay

## 2019-04-30 ENCOUNTER — Other Ambulatory Visit: Payer: Self-pay

## 2019-04-30 ENCOUNTER — Ambulatory Visit
Admission: RE | Admit: 2019-04-30 | Discharge: 2019-04-30 | Disposition: A | Discharge status: Home / Self Care | Payer: Medicare Other | Source: Ambulatory Visit | Attending: Radiation Oncology | Admitting: Radiation Oncology

## 2019-04-30 ENCOUNTER — Encounter: Payer: Self-pay | Admitting: Oncology

## 2019-04-30 ENCOUNTER — Inpatient Hospital Stay: Payer: Medicare Other | Attending: Oncology | Admitting: Oncology

## 2019-04-30 ENCOUNTER — Encounter: Payer: Self-pay | Admitting: Radiation Oncology

## 2019-04-30 ENCOUNTER — Other Ambulatory Visit: Payer: Self-pay | Admitting: *Deleted

## 2019-04-30 VITALS — BP 144/86 | HR 89 | Temp 98.9°F | Resp 16 | Wt 126.6 lb

## 2019-04-30 DIAGNOSIS — Z8249 Family history of ischemic heart disease and other diseases of the circulatory system: Secondary | ICD-10-CM | POA: Diagnosis not present

## 2019-04-30 DIAGNOSIS — F17210 Nicotine dependence, cigarettes, uncomplicated: Secondary | ICD-10-CM | POA: Insufficient documentation

## 2019-04-30 DIAGNOSIS — F329 Major depressive disorder, single episode, unspecified: Secondary | ICD-10-CM | POA: Diagnosis not present

## 2019-04-30 DIAGNOSIS — Z791 Long term (current) use of non-steroidal anti-inflammatories (NSAID): Secondary | ICD-10-CM | POA: Insufficient documentation

## 2019-04-30 DIAGNOSIS — E785 Hyperlipidemia, unspecified: Secondary | ICD-10-CM | POA: Diagnosis not present

## 2019-04-30 DIAGNOSIS — Z808 Family history of malignant neoplasm of other organs or systems: Secondary | ICD-10-CM | POA: Insufficient documentation

## 2019-04-30 DIAGNOSIS — R918 Other nonspecific abnormal finding of lung field: Secondary | ICD-10-CM

## 2019-04-30 DIAGNOSIS — C3431 Malignant neoplasm of lower lobe, right bronchus or lung: Secondary | ICD-10-CM | POA: Diagnosis not present

## 2019-04-30 DIAGNOSIS — J449 Chronic obstructive pulmonary disease, unspecified: Secondary | ICD-10-CM | POA: Diagnosis not present

## 2019-04-30 DIAGNOSIS — Z79899 Other long term (current) drug therapy: Secondary | ICD-10-CM | POA: Insufficient documentation

## 2019-04-30 NOTE — Progress Notes (Signed)
Radiation Oncology Follow up Note  Name: Latoya Lynch   Date:   04/30/2019 MRN:  [id number] DOB: 10/21/1940    This 79 y.o. female presents to the clinic today for 90-month follow-up status post SBRT right lower lobe for primary bronchogenic carcinoma.  REFERRING PROVIDER: Jodi Marble, MD  HPI: Patient is a 79 year old female now out 8 months having completed SBRT to her right lower lobe for presumed nonbiopsy primary bronchogenic carcinoma.  Seen today in routine follow-up she is asymptomatic specifically denies productive cough hemoptysis or chest tightness..  She had a CT scan of the chest which I have reviewed yesterday shows a 1.8 x 1.4 central right lower lobe nodule not in the area of previous treatment slightly more conspicuous than on prior study but with out significant enlargement over the past 73-month interval.  I have looked back to her 2019 PET scan this lesion was not present.  Bronchoscopy if clinically able was recommended.  COMPLICATIONS OF TREATMENT: none  FOLLOW UP COMPLIANCE: keeps appointments   PHYSICAL EXAM:  BP (!) 144/86   Pulse 89   Temp 98.9 F (37.2 C)   Resp 16   Wt 126 lb 9.6 oz (57.4 kg)   SpO2 95%   BMI 22.43 kg/m  Well-developed well-nourished patient in NAD. HEENT reveals PERLA, EOMI, discs not visualized.  Oral cavity is clear. No oral mucosal lesions are identified. Neck is clear without evidence of cervical or supraclavicular adenopathy. Lungs are clear to A&P. Cardiac examination is essentially unremarkable with regular rate and rhythm without murmur rub or thrill. Abdomen is benign with no organomegaly or masses noted. Motor sensory and DTR levels are equal and symmetric in the upper and lower extremities. Cranial nerves II through XII are grossly intact. Proprioception is intact. No peripheral adenopathy or edema is identified. No motor or sensory levels are noted. Crude visual fields are within normal range.  RADIOLOGY RESULTS: CT  scans and prior CT scans and PET scans all reviewed compatible with above-stated findings  PLAN: At this time I am referring patient back to Dr. Patsey Berthold for possible bronchoscopy and evaluation of the central right lower lobe mass.  Patient is also scheduled for left hip replacement surgery in the next month or 2.  Hopefully we get a biopsy of this lesion and if it is non-small cell lung cancer will make clinical decisions at that point as to avenue of treatment.  Certainly if this is biopsy positive would order a PET scan to completely stage the patient at that point.  Patient comprehends my recommendations well we will see Dr. Janese Banks this afternoon.  I would like to take this opportunity to thank you for allowing me to participate in the care of your patient.Noreene Filbert, MD

## 2019-04-30 NOTE — Progress Notes (Signed)
Patient stated that she had been doing well with no concerns. Patient stated that she had not noticed any blood in her urine or stools.

## 2019-05-02 NOTE — Progress Notes (Signed)
Hematology/Oncology Consult note [facility]  [phone] [phone]  Patient Care Team: Jodi Marble, MD as PCP - General (Internal Medicine) Telford Nab, RN as Registered Nurse   Name of the patient: Latoya Lynch  [id number]  [birth year]   Date of visit: 05/02/19  Diagnosis- right lower lobe lung mass 1.7 cm presumptive diagnosis of lung cancer S/p SBRT  Chief complaint/ Reason for visit-routine follow-up of lung cancer  Heme/Onc history: Patient is a 79 year old female with a 30-pack-year smoking. She underwent CT chest in September 2019 which showed 1.7 x 1.1 x 1.3 cm spiculated mass at the right lung base suspicious for primary lung cancer. PET scan in October 2019 showed hypermetabolism with an SUV of 5 in that area. No other hypermetabolic them elsewhere. Patient then had a CT-guided biopsy which did not reveal any malignancy. It showed benign alveolar related lung parenchyma with associated blood and organizing fibrin. She also met with Dr. Genevive Bi but did not wish to proceed with definitive surgery for her right lower lobe lung nodule. Case was discussed at tumor board and plan was to get a repeat bronchoscopy guided biopsy.  Patient underwent a CT super D chest without contrast on 07/09/2018 which showed spiculated irregular solid 2.1 x 1.9 cm mass which was increased from 1.9 x 1.6 cm from prior imaging in March 2020. This is suspicious for primary bronchogenic carcinoma. Patient underwent bronchoscopy guided biopsy which showed benign alveolar related lung parenchyma with hemorrhage. 1 of the cytology specimens did show atypical cells suspicious for but not diagnostic for malignancy. Endosonographic appearance at the time of bronchoscopy however was suspicious for malignancy. She is s/p SBRT and currently on surveillance    Interval history- she has baseline fatigue and exertional SOB. She sees orthopedica  for her hip pain. She hopes to get her hip surgery soon  ECOG PS- 1 Pain scale- 0  Review of systems- Review of Systems  Constitutional: Positive for malaise/fatigue. Negative for chills, fever and weight loss.  HENT: Negative for congestion, ear discharge and nosebleeds.   Eyes: Negative for blurred vision.  Respiratory: Positive for shortness of breath. Negative for cough, hemoptysis, sputum production and wheezing.   Cardiovascular: Negative for chest pain, palpitations, orthopnea and claudication.  Gastrointestinal: Negative for abdominal pain, blood in stool, constipation, diarrhea, heartburn, melena, nausea and vomiting.  Genitourinary: Negative for dysuria, flank pain, frequency, hematuria and urgency.  Musculoskeletal: Negative for back pain, joint pain and myalgias.  Skin: Negative for rash.  Neurological: Negative for dizziness, tingling, focal weakness, seizures, weakness and headaches.  Endo/Heme/Allergies: Does not bruise/bleed easily.  Psychiatric/Behavioral: Negative for depression and suicidal ideas. The patient does not have insomnia.        Allergies  Allergen Reactions  . Other Anaphylaxis    All nuts  . Peanut Oil Anaphylaxis  . Prochlorperazine Anaphylaxis     Past Medical History:  Diagnosis Date  . COPD (chronic obstructive pulmonary disease) ([place])   . Depression   . History of methicillin resistant staphylococcus aureus (MRSA)   . Hyperlipidemia      Past Surgical History:  Procedure Laterality Date  . CATARACT EXTRACTION W/ INTRAOCULAR LENS  IMPLANT, BILATERAL    . 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 ELECTROMAGNETIC NAVIGATION BROCHOSCOPY Right 07/30/2018   Procedure: ELECTROMAGNETIC NAVIGATION BRONCHOSCOPY RIGHT;  Surgeon: Tyler Pita, MD;  Location: [facility] ORS;  Service: Cardiopulmonary;  Laterality: Right;  . ENDOBRONCHIAL ULTRASOUND Right 07/30/2018   Procedure: ENDOBRONCHIAL  ULTRASOUND RIGHT;  Surgeon: Tyler Pita, MD;  Location:  [facility] ORS;  Service: Cardiopulmonary;  Laterality: Right;  . retinal detatchment Right 05/2017  . TONSILLECTOMY    . TOTAL HIP ARTHROPLASTY Right 01/2015  . TUBAL LIGATION      Social History   Socioeconomic History  . Marital status: Widowed    Spouse name: Not on file  . Number of children: Not on file  . Years of education: Not on file  . Highest education level: Not on file  Occupational History  . Not on file  Tobacco Use  . Smoking status: Current Every Day Smoker    Packs/day: 0.50    Years: 60.00    Pack years: 30.00    Types: Cigarettes  . Smokeless tobacco: Never Used  . Tobacco comment: about 15 a day  Substance and Sexual Activity  . Alcohol use: Not Currently    Comment: once in a great while  . Drug use: Never  . Sexual activity: Not Currently  Other Topics Concern  . Not on file  Social History Narrative  . Not on file   Social Determinants of Health   Financial Resource Strain:   . Difficulty of Paying Living Expenses: Not on file  Food Insecurity:   . Worried About Charity fundraiser in the Last Year: Not on file  . Ran Out of Food in the Last Year: Not on file  Transportation Needs:   . Lack of Transportation (Medical): Not on file  . Lack of Transportation (Non-Medical): Not on file  Physical Activity:   . Days of Exercise per Week: Not on file  . Minutes of Exercise per Session: Not on file  Stress:   . Feeling of Stress : Not on file  Social Connections:   . Frequency of Communication with Friends and Family: Not on file  . Frequency of Social Gatherings with Friends and Family: Not on file  . Attends Religious Services: Not on file  . Active Member of Clubs or Organizations: Not on file  . Attends Archivist Meetings: Not on file  . Marital Status: Not on file  Intimate Partner Violence:   . Fear of Current or Ex-Partner: Not on file  . Emotionally Abused: Not on file  . Physically Abused: Not on file  . Sexually Abused: Not  on file    Family History  Problem Relation Age of Onset  . Heart attack Mother   . Cancer Maternal Grandmother 80       spine ca  . Heart attack Maternal Grandfather      Current Outpatient Medications:  .  ANORO ELLIPTA 62.5-25 MCG/INH AEPB, INHALE 1 PUFF INTO THE LUNGS DAILY, Disp: 60 each, Rfl: 1 .  buPROPion (WELLBUTRIN SR) 150 MG 12 hr tablet, Take 150 mg by mouth 2 (two) times daily. , Disp: , Rfl:  .  EPINEPHrine (EPIPEN 2-PAK) 0.3 mg/0.3 mL IJ SOAJ injection, Inject 0.3 mg into the muscle as needed for anaphylaxis., Disp: , Rfl:  .  loratadine (CLARITIN) 10 MG tablet, Take 10 mg by mouth every morning. , Disp: , Rfl:  .  meloxicam (MOBIC) 15 MG tablet, Take 15 mg by mouth daily., Disp: , Rfl:  .  naproxen sodium (ALEVE) 220 MG tablet, Take 440 mg by mouth every morning. , Disp: , Rfl:  .  Omega-3 1000 MG CAPS, Take 1,000 mg by mouth daily. , Disp: , Rfl:  .  riboflavin (VITAMIN B-2) 100 MG  TABS tablet, Take 200 mg by mouth daily., Disp: , Rfl:  .  zinc gluconate 50 MG tablet, Take 50 mg by mouth daily., Disp: , Rfl:   Current Facility-Administered Medications:  .  umeclidinium-vilanterol (ANORO ELLIPTA) 62.5-25 MCG/INH 1 puff, 1 puff, Inhalation, Daily, Tyler Pita, MD  Physical exam: There were no vitals filed for this visit. Physical Exam HENT:     Head: Normocephalic and atraumatic.  Eyes:     Pupils: Pupils are equal, round, and reactive to light.  Cardiovascular:     Rate and Rhythm: Normal rate and regular rhythm.     Heart sounds: Normal heart sounds.  Pulmonary:     Effort: Pulmonary effort is normal.     Comments: Breath sounds decreased at the bases Abdominal:     General: Bowel sounds are normal.     Palpations: Abdomen is soft.  Musculoskeletal:     Cervical back: Normal range of motion.  Skin:    General: Skin is warm and dry.  Neurological:     Mental Status: She is alert and oriented to person, place, and time.      CMP Latest Ref Rng &  Units 05/16/2018  Glucose 70 - 99 mg/dL 102(H)  BUN 8 - 23 mg/dL 20  Creatinine 0.44 - 1.00 mg/dL 0.79  Sodium 135 - 145 mmol/L 141  Potassium 3.5 - 5.1 mmol/L 3.8  Chloride 98 - 111 mmol/L 107  CO2 22 - 32 mmol/L 25  Calcium 8.9 - 10.3 mg/dL 9.0  Total Protein 6.5 - 8.1 g/dL -  Total Bilirubin 0.3 - 1.2 mg/dL -  Alkaline Phos 38 - 126 U/L -  AST 15 - 41 U/L -  ALT 0 - 44 U/L -   CBC Latest Ref Rng & Units 05/16/2018  WBC 4.0 - 10.5 K/uL 6.7  Hemoglobin 12.0 - 15.0 g/dL 14.2  Hematocrit 36.0 - 46.0 % 42.1  Platelets 150 - 400 K/uL 201    No images are attached to the encounter.  CT Chest Wo Contrast  Result Date: 04/29/2019 CLINICAL DATA:  Follow-up pulmonary nodule, status post radiation for presumed lung cancer EXAM: CT CHEST WITHOUT CONTRAST TECHNIQUE: Multidetector CT imaging of the chest was performed following the standard protocol without IV contrast. COMPARISON:  10/23/2018 FINDINGS: Cardiovascular: The heart is normal in size. No pericardial effusion. No evidence of thoracic aortic aneurysm. Atherosclerotic calcifications of the aortic arch. Three vessel coronary atherosclerosis. Mediastinum/Nodes: No suspicious mediastinal or axillary lymphadenopathy. Subcentimeter left thyroid nodule. Dedicated follow-up imaging is not required. Lungs/Pleura: Biapical pleural-parenchymal scarring, right greater than left. Mild centrilobular and paraseptal emphysematous changes, upper lung predominant. 1.8 x 1.4 cm central right lower lobe nodule (series 3/image 102), most conspicuous on soft tissue windows (series 2/image 102). This abuts/displaces central right lower lobe bronchi. In retrospect, this may have been present on the prior, although is slightly more conspicuous/rounded on the current study, but not significantly larger over the 6 month interval. Radiation changes in the right lower lobe with underlying progressive 2.7 x 1.8 cm nodular opacity (series 3/image 129). However, this appears  triangular/platelike on coronal imaging (series 5/image 94), favoring progressive radiation changes, although recurrence/viable tumor is difficult to entirely exclude. No focal consolidation. No pleural effusion or pneumothorax. Upper Abdomen: Visualized upper abdomen is grossly unremarkable, noting vascular calcifications. Musculoskeletal: Mild degenerative changes of the visualized thoracolumbar spine. IMPRESSION: 1.8 x 1.4 cm central right lower lobe nodule, as described above, slightly more conspicuous than on the prior but  without significant enlargement over the 6 month interval. While tumor is certainly possible, a benign etiology is not excluded given the lack of significant progression. Consider bronchoscopy if clinically able. Progressive right lower lobe opacity favors radiation changes, although recurrence/viable tumor is difficult to entirely exclude. Please note that if PET-CT is performed, radiation changes may still demonstrate variable hypermetabolism at this time. Aortic Atherosclerosis (ICD10-I70.0) and Emphysema (ICD10-J43.9). Electronically Signed   By: Julian Hy M.D.   On: 04/29/2019 11:06     Assessment and plan- Patient is a 79 y.o. female with 2 cm right lower lobe lung mass with presumed diagnosis of lung cancer status post SBRT  Her present CT chest shows 2 separate lung masses. The prior RLL mass appears stable and appears more consistent with radiation changes. However there is also another central RLL 1.8 cm nodule which appears more conspicuous but present 6 months prior. Patient was seen by DR. Chrystal and has been referred to pulmonary for consideration for bronchoscopy. She has a follow up with DR. Chrystal following that. I will schedule a CT chest in 6 months for now but plans may change depending on pulmonary evaluation   Visit Diagnosis 1. Mass of lower lobe of right lung      Dr. Randa Evens, MD, MPH [facility] [phone]  05/02/2019 4:40 PM

## 2019-05-04 HISTORY — PX: TOTAL HIP ARTHROPLASTY: SHX124

## 2019-05-12 ENCOUNTER — Ambulatory Visit: Payer: Medicare Other | Admitting: Pulmonary Disease

## 2019-05-13 ENCOUNTER — Telehealth: Payer: Self-pay | Admitting: Pulmonary Disease

## 2019-05-13 NOTE — Telephone Encounter (Signed)
Latoya Lynch with [facility] called to check the status of a surgical clearance form that was faxed to [place] on 05/04/2019. I let her know that she was scheduled w/ [facility] on 05/12/19 and she cancelled it due to having surgery, I asked her about it and she still insisted on cancelling. Her surgery is scheduled for 3/18. [name] would like a call back to figure out if they need to cancel her surgery until she gets pulmonary clearance. Cb#: [phone]

## 2019-05-13 NOTE — Telephone Encounter (Signed)
ATC pt x4 and received recording that call could not be completed at this time.  Will call back.

## 2019-05-13 NOTE — Telephone Encounter (Addendum)
Spoke to Courtenay with [facility], who is requesting update on surgical clearance that is scheduled for 05/21/2019. [name] is aware that pt had an appt scheduled for 05/12/2019, however pt wished to cancel due to surgery.  I have made [name] aware that an appt will be needed prior to clearance.  Lm for pt to make her aware that an appt is needed

## 2019-05-13 NOTE — Telephone Encounter (Signed)
Patient is returning phone call.  Patient phone number is [phone].

## 2019-05-14 NOTE — Telephone Encounter (Signed)
Her mostly for her lung lesion.  She is on an inhaler but if she is stable on this anesthesia can make the decision for risk assessment.  Does not seem that she has had recent exacerbations.  She did well during general anesthesia for navigational bronchoscopy.

## 2019-05-14 NOTE — Telephone Encounter (Addendum)
Lm for [name] with [facility].

## 2019-05-14 NOTE — Telephone Encounter (Signed)
Spoke to pt and made her aware that an appt is needed prior to clearance. Pt was scheduled for an appt on 05/12/2019 and canceled it.  Pt is aware that first available will not be until the end of April with Dr. Patsey Berthold. I offered sooner appt with a NP in our [place] office and pt declined due to not being able to drive.   Pt's hip surgery is scheduled for 05/21/2019. Pt became upset that we could not get her in sooner, as she is in pain with her hip and can not wait any longer.   LG, please advise. Thanks

## 2019-05-15 NOTE — Telephone Encounter (Signed)
Surgical clearance form has been completed and faxed to [facility]. Pt is aware and voiced her understanding.  Nothing further is needed.

## 2019-05-15 NOTE — Telephone Encounter (Signed)
Spoke with elizabeth with [facility] and relayed below message from Dr. Patsey Berthold.  [name] will be faxing over surgical clearance form.

## 2019-05-15 NOTE — Telephone Encounter (Signed)
Lm x2 for [facility] with [facility].

## 2019-05-15 NOTE — Telephone Encounter (Signed)
Form has been received and placed in Dr. Domingo Dimes folder.

## 2019-05-19 ENCOUNTER — Ambulatory Visit: Payer: Medicare Other | Admitting: Internal Medicine

## 2019-06-02 DIAGNOSIS — Z96649 Presence of unspecified artificial hip joint: Secondary | ICD-10-CM | POA: Insufficient documentation

## 2019-06-11 ENCOUNTER — Ambulatory Visit: Payer: Medicare Other | Admitting: Radiation Oncology

## 2019-10-28 ENCOUNTER — Other Ambulatory Visit: Payer: Self-pay

## 2019-10-28 ENCOUNTER — Inpatient Hospital Stay: Payer: Medicare Other | Attending: Oncology

## 2019-10-28 ENCOUNTER — Ambulatory Visit
Admission: RE | Admit: 2019-10-28 | Discharge: 2019-10-28 | Disposition: A | Discharge status: Home / Self Care | Payer: Medicare Other | Source: Ambulatory Visit | Attending: Oncology | Admitting: Oncology

## 2019-10-28 DIAGNOSIS — J449 Chronic obstructive pulmonary disease, unspecified: Secondary | ICD-10-CM | POA: Insufficient documentation

## 2019-10-28 DIAGNOSIS — C3431 Malignant neoplasm of lower lobe, right bronchus or lung: Secondary | ICD-10-CM | POA: Diagnosis not present

## 2019-10-28 DIAGNOSIS — Z79899 Other long term (current) drug therapy: Secondary | ICD-10-CM | POA: Diagnosis not present

## 2019-10-28 DIAGNOSIS — Z791 Long term (current) use of non-steroidal anti-inflammatories (NSAID): Secondary | ICD-10-CM | POA: Insufficient documentation

## 2019-10-28 DIAGNOSIS — R918 Other nonspecific abnormal finding of lung field: Secondary | ICD-10-CM | POA: Diagnosis not present

## 2019-10-28 DIAGNOSIS — E785 Hyperlipidemia, unspecified: Secondary | ICD-10-CM | POA: Insufficient documentation

## 2019-10-28 DIAGNOSIS — Z8249 Family history of ischemic heart disease and other diseases of the circulatory system: Secondary | ICD-10-CM | POA: Insufficient documentation

## 2019-10-28 DIAGNOSIS — F17210 Nicotine dependence, cigarettes, uncomplicated: Secondary | ICD-10-CM | POA: Diagnosis not present

## 2019-10-28 DIAGNOSIS — Z808 Family history of malignant neoplasm of other organs or systems: Secondary | ICD-10-CM | POA: Insufficient documentation

## 2019-10-28 DIAGNOSIS — F329 Major depressive disorder, single episode, unspecified: Secondary | ICD-10-CM | POA: Insufficient documentation

## 2019-10-28 LAB — COMPREHENSIVE METABOLIC PANEL
ALT: 20 U/L (ref 0–44)
AST: 26 U/L (ref 15–41)
Albumin: 3.9 g/dL (ref 3.5–5.0)
Alkaline Phosphatase: 75 U/L (ref 38–126)
Anion gap: 9 (ref 5–15)
BUN: 19 mg/dL (ref 8–23)
CO2: 25 mmol/L (ref 22–32)
Calcium: 8.9 mg/dL (ref 8.9–10.3)
Chloride: 107 mmol/L (ref 98–111)
Creatinine, Ser: 0.94 mg/dL (ref 0.44–1.00)
GFR calc Af Amer: >60 mL/min (ref >60)
GFR calc non Af Amer: 58 mL/min — ABNORMAL LOW (ref >60)
Glucose, Bld: 103 mg/dL — ABNORMAL HIGH (ref 70–99)
Potassium: 3.7 mmol/L (ref 3.5–5.1)
Sodium: 141 mmol/L (ref 135–145)
Total Bilirubin: 0.9 mg/dL (ref 0.3–1.2)
Total Protein: 6.7 g/dL (ref 6.5–8.1)

## 2019-10-28 LAB — CBC WITH DIFFERENTIAL/PLATELET
Abs Immature Granulocytes: 0.02 10*3/uL (ref 0.00–0.07)
Basophils Absolute: 0.1 10*3/uL (ref 0.0–0.1)
Basophils Relative: 1 %
Eosinophils Absolute: 0.2 10*3/uL (ref 0.0–0.5)
Eosinophils Relative: 3 %
HCT: 39.7 % (ref 36.0–46.0)
Hemoglobin: 13.9 g/dL (ref 12.0–15.0)
Immature Granulocytes: 0 %
Lymphocytes Relative: 31 %
Lymphs Abs: 2.4 10*3/uL (ref 0.7–4.0)
MCH: 32.6 pg (ref 26.0–34.0)
MCHC: 35 g/dL (ref 30.0–36.0)
MCV: 93 fL (ref 80.0–100.0)
Monocytes Absolute: 0.6 10*3/uL (ref 0.1–1.0)
Monocytes Relative: 7 %
Neutro Abs: 4.4 10*3/uL (ref 1.7–7.7)
Neutrophils Relative %: 58 %
Platelets: 220 10*3/uL (ref 150–400)
RBC: 4.27 MIL/uL (ref 3.87–5.11)
RDW: 14.3 % (ref 11.5–15.5)
WBC: 7.7 10*3/uL (ref 4.0–10.5)
nRBC: 0 % (ref 0.0–0.2)

## 2019-10-30 ENCOUNTER — Inpatient Hospital Stay: Payer: Medicare Other | Admitting: Oncology

## 2019-10-30 ENCOUNTER — Encounter: Payer: Self-pay | Admitting: Oncology

## 2019-10-30 ENCOUNTER — Other Ambulatory Visit: Payer: Self-pay

## 2019-10-30 VITALS — BP 127/69 | HR 78 | Temp 98.1°F | Resp 16 | Wt 117.1 lb

## 2019-10-30 DIAGNOSIS — C3431 Malignant neoplasm of lower lobe, right bronchus or lung: Secondary | ICD-10-CM | POA: Diagnosis not present

## 2019-10-30 DIAGNOSIS — R918 Other nonspecific abnormal finding of lung field: Secondary | ICD-10-CM | POA: Diagnosis not present

## 2019-10-30 NOTE — Progress Notes (Signed)
Hematology/Oncology Consult note [facility]  [phone] [phone]  Patient Care Team: Jodi Marble, MD as PCP - General (Internal Medicine) Telford Nab, RN as Registered Nurse   Name of the patient: Latoya Lynch  [id number]  Apr 21, 1940   Date of visit: 10/30/19  Diagnosis- right lower lobe lung mass 1.7 cm presumptive diagnosis of lung cancer S/p SBRT  Chief complaint/ Reason for visit-routine follow-up of lung cancer and to discuss CT chest findings  Heme/Onc history: Patient is a 79 year old female with a 30-pack-year smoking. She underwent CT chest in September 2019 which showed 1.7 x 1.1 x 1.3 cm spiculated mass at the right lung base suspicious for primary lung cancer. PET scan in October 2019 showed hypermetabolism with an SUV of 5 in that area. No other hypermetabolic them elsewhere. Patient then had a CT-guided biopsy which did not reveal any malignancy. It showed benign alveolar related lung parenchyma with associated blood and organizing fibrin. She also met with Dr. Genevive Bi but did not wish to proceed with definitive surgery for her right lower lobe lung nodule. Case was discussed at tumor board and plan was to get a repeat bronchoscopy guided biopsy.  Patient underwent a CT super D chest without contrast on 07/09/2018 which showed spiculated irregular solid 2.1 x 1.9 cm mass which was increased from 1.9 x 1.6 cm from prior imaging in March 2020. This is suspicious for primary bronchogenic carcinoma. Patient underwent bronchoscopy guided biopsy which showed benign alveolar related lung parenchyma with hemorrhage. 1 of the cytology specimens did show atypical cells suspicious for but not diagnostic for malignancy. Endosonographic appearance at the time of bronchoscopy however was suspicious for malignancy. She is s/p SBRT and currently on surveillance    Interval history-patient underwent hip surgery in  March 2021.  Reports that her ambulation is better since then.  She has received therapy over the vaccine.  Appetite and weight have remained stable.  Denies any increasing shortness of breath  ECOG PS- 2 Pain scale- 0   Review of systems- Review of Systems  Constitutional: Positive for malaise/fatigue. Negative for chills, fever and weight loss.  HENT: Negative for congestion, ear discharge and nosebleeds.   Eyes: Negative for blurred vision.  Respiratory: Negative for cough, hemoptysis, sputum production, shortness of breath and wheezing.   Cardiovascular: Negative for chest pain, palpitations, orthopnea and claudication.  Gastrointestinal: Negative for abdominal pain, blood in stool, constipation, diarrhea, heartburn, melena, nausea and vomiting.  Genitourinary: Negative for dysuria, flank pain, frequency, hematuria and urgency.  Musculoskeletal: Negative for back pain, joint pain and myalgias.  Skin: Negative for rash.  Neurological: Negative for dizziness, tingling, focal weakness, seizures, weakness and headaches.  Endo/Heme/Allergies: Does not bruise/bleed easily.  Psychiatric/Behavioral: Negative for depression and suicidal ideas. The patient does not have insomnia.       Allergies  Allergen Reactions  . Other Anaphylaxis    All nuts  . Peanut Oil Anaphylaxis  . Prochlorperazine Anaphylaxis     Past Medical History:  Diagnosis Date  . Arthritis   . COPD (chronic obstructive pulmonary disease) ([place])   . Depression   . History of methicillin resistant staphylococcus aureus (MRSA)   . Hyperlipidemia      Past Surgical History:  Procedure Laterality Date  . CATARACT EXTRACTION W/ INTRAOCULAR LENS  IMPLANT, BILATERAL    . 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 ELECTROMAGNETIC NAVIGATION BROCHOSCOPY Right 07/30/2018   Procedure: ELECTROMAGNETIC NAVIGATION BRONCHOSCOPY RIGHT;  Surgeon: Patsey Berthold,  Lolita Cram, MD;  Location: [facility] ORS;  Service: Cardiopulmonary;  Laterality:  Right;  . ENDOBRONCHIAL ULTRASOUND Right 07/30/2018   Procedure: ENDOBRONCHIAL ULTRASOUND RIGHT;  Surgeon: Tyler Pita, MD;  Location: [facility] ORS;  Service: Cardiopulmonary;  Laterality: Right;  . retinal detatchment Right 05/2017  . TONSILLECTOMY    . TOTAL HIP ARTHROPLASTY Right 01/2015  . TOTAL HIP ARTHROPLASTY Left 05/2019   in [place]  . TUBAL LIGATION      Social History   Socioeconomic History  . Marital status: Widowed    Spouse name: Not on file  . Number of children: Not on file  . Years of education: Not on file  . Highest education level: Not on file  Occupational History  . Not on file  Tobacco Use  . Smoking status: Current Every Day Smoker    Packs/day: 0.50    Years: 60.00    Pack years: 30.00    Types: Cigarettes  . Smokeless tobacco: Never Used  . Tobacco comment: about 15 a day  Vaping Use  . Vaping Use: Never used  Substance and Sexual Activity  . Alcohol use: Not Currently    Comment: once in a great while  . Drug use: Never  . Sexual activity: Not Currently  Other Topics Concern  . Not on file  Social History Narrative  . Not on file   Social Determinants of Health   Financial Resource Strain:   . Difficulty of Paying Living Expenses: Not on file  Food Insecurity:   . Worried About Charity fundraiser in the Last Year: Not on file  . Ran Out of Food in the Last Year: Not on file  Transportation Needs:   . Lack of Transportation (Medical): Not on file  . Lack of Transportation (Non-Medical): Not on file  Physical Activity:   . Days of Exercise per Week: Not on file  . Minutes of Exercise per Session: Not on file  Stress:   . Feeling of Stress : Not on file  Social Connections:   . Frequency of Communication with Friends and Family: Not on file  . Frequency of Social Gatherings with Friends and Family: Not on file  . Attends Religious Services: Not on file  . Active Member of Clubs or Organizations: Not on file  . Attends Theatre manager Meetings: Not on file  . Marital Status: Not on file  Intimate Partner Violence:   . Fear of Current or Ex-Partner: Not on file  . Emotionally Abused: Not on file  . Physically Abused: Not on file  . Sexually Abused: Not on file    Family History  Problem Relation Age of Onset  . Heart attack Mother   . Cancer Maternal Grandmother 80       spine ca  . Heart attack Maternal Grandfather      Current Outpatient Medications:  .  buPROPion (WELLBUTRIN SR) 150 MG 12 hr tablet, Take 150 mg by mouth 2 (two) times daily. , Disp: , Rfl:  .  EPINEPHrine (EPIPEN 2-PAK) 0.3 mg/0.3 mL IJ SOAJ injection, Inject 0.3 mg into the muscle as needed for anaphylaxis., Disp: , Rfl:  .  loratadine (CLARITIN) 10 MG tablet, Take 10 mg by mouth every morning. , Disp: , Rfl:  .  Omega-3 1000 MG CAPS, Take 1,000 mg by mouth daily. , Disp: , Rfl:  .  Riboflavin (VITAMIN B-2 PO), Take by mouth., Disp: , Rfl:  .  riboflavin (VITAMIN B-2) 100 MG  TABS tablet, Take 200 mg by mouth in the morning and at bedtime. , Disp: , Rfl:  .  meloxicam (MOBIC) 15 MG tablet, Take 15 mg by mouth 3 (three) times daily. , Disp: , Rfl:  .  naproxen sodium (ALEVE) 220 MG tablet, Take 440 mg by mouth every morning. , Disp: , Rfl:  .  zinc gluconate 50 MG tablet, Take 50 mg by mouth daily., Disp: , Rfl:   Current Facility-Administered Medications:  .  umeclidinium-vilanterol (ANORO ELLIPTA) 62.5-25 MCG/INH 1 puff, 1 puff, Inhalation, Daily, Tyler Pita, MD  Physical exam:  Vitals:   10/30/19 1351  BP: 127/69  Pulse: 78  Resp: 16  Temp: 98.1 F (36.7 C)  TempSrc: Oral  Weight: 117 lb 1.6 oz (53.1 kg)   Physical Exam Constitutional:      General: She is not in acute distress. HENT:     Head: Normocephalic and atraumatic.  Cardiovascular:     Rate and Rhythm: Normal rate and regular rhythm.     Heart sounds: Normal heart sounds.  Pulmonary:     Effort: Pulmonary effort is normal.     Breath sounds:  Normal breath sounds.  Skin:    General: Skin is warm and dry.  Neurological:     Mental Status: She is alert and oriented to person, place, and time.      CMP Latest Ref Rng & Units 10/28/2019  Glucose 70 - 99 mg/dL 103(H)  BUN 8 - 23 mg/dL 19  Creatinine 0.44 - 1.00 mg/dL 0.94  Sodium 135 - 145 mmol/L 141  Potassium 3.5 - 5.1 mmol/L 3.7  Chloride 98 - 111 mmol/L 107  CO2 22 - 32 mmol/L 25  Calcium 8.9 - 10.3 mg/dL 8.9  Total Protein 6.5 - 8.1 g/dL 6.7  Total Bilirubin 0.3 - 1.2 mg/dL 0.9  Alkaline Phos 38 - 126 U/L 75  AST 15 - 41 U/L 26  ALT 0 - 44 U/L 20   CBC Latest Ref Rng & Units 10/28/2019  WBC 4.0 - 10.5 K/uL 7.7  Hemoglobin 12.0 - 15.0 g/dL 13.9  Hematocrit 36 - 46 % 39.7  Platelets 150 - 400 K/uL 220    No images are attached to the encounter.  CT Chest Wo Contrast  Result Date: 10/28/2019 CLINICAL DATA:  Right lower lobe lung mass, status post SBRT. Hypermetabolic on October 7408 PET-CT with SUV of 5, but biopsy did not confirm malignancy. EXAM: CT CHEST WITHOUT CONTRAST TECHNIQUE: Multidetector CT imaging of the chest was performed following the standard protocol without IV contrast. COMPARISON:  04/28/2019 FINDINGS: Cardiovascular: Coronary, aortic arch, and branch vessel atherosclerotic vascular disease. Mediastinum/Nodes: Suspected small type 1 hiatal hernia. Lungs/Pleura: Biapical pleuroparenchymal scarring. Centrilobular emphysema. 3 mm calcified left lower lobe granuloma on image 89/3, stable and benign. Increased endoluminal filling defect in the right lower lobe bronchus with occlusion or near occlusion of the segmental bronchi and increasing lobularity of the margins of this peribronchovascular process measuring 2.4 by 2.5 cm on image 83/3. By my measurements this was previously about 2.3 by 1.6 cm on 04/28/2019 and about 1.9 by 1.6 cm on 10/23/2018, indicating interval growth. Distal volume loss and airway plugging likely combination of scarring, radiation  therapy related findings, and postobstructive pneumonitis. Upper Abdomen: Unremarkable Musculoskeletal: Thoracic spondylosis. IMPRESSION: 1. Enlarging endoluminal filling defect and peribronchovascular nodule in the right lower lobe favoring malignancy. 2. Coronary, aortic arch, and branch vessel atherosclerotic vascular disease. 3. Suspected small type 1 hiatal hernia. 4.  Emphysema and aortic atherosclerosis. Aortic Atherosclerosis (ICD10-I70.0) and Emphysema (ICD10-J43.9). Electronically Signed   By: Van Clines M.D.   On: 10/28/2019 13:06     Assessment and plan- Patient is a 79 y.o. female with 2 cm right lower lobe lung mass with presumed diagnosis of lung cancer status post SBRT.  She is here to discuss CT scan results and further management  I have reviewed CT chest images independently and discussed findings with the patient.  She has received SBRT to right lower lobe mass previously emphatically asked tissue diagnosis could not be obtained.  Serial CT scans over the last 1 year have demonstrated slow increase in the size of the mass which is now up to 2.4 x 2.5 cm.  No evidence of hilar or mediastinal adenopathy.  I have discussed her case with Dr. Patsey Berthold and she recommends a repeat PET CT scan at this time following which she will follow-up for Dr. Patsey Berthold  I will set up a PET CT scan for her in 2 weeks time and she has an appointment with Dr. Patsey Berthold in 3 weeks.  I will see her back in 4 months no labs.  Based on PET CT scan we may be looking at repeat bronchoscopy followed by reirradiation versus empiric radiation treatment it is possible   Visit Diagnosis 1. Mass of lower lobe of right lung   2. Abnormal CT scan of lung      Dr. Randa Evens, MD, MPH [facility] at [facility] [phone] 10/30/2019 1:41 PM

## 2019-11-11 ENCOUNTER — Encounter
Admission: RE | Admit: 2019-11-11 | Discharge: 2019-11-11 | Disposition: A | Discharge status: Home / Self Care | Payer: Medicare Other | Source: Ambulatory Visit | Attending: Oncology | Admitting: Oncology

## 2019-11-11 ENCOUNTER — Other Ambulatory Visit: Payer: Self-pay

## 2019-11-11 DIAGNOSIS — M479 Spondylosis, unspecified: Secondary | ICD-10-CM | POA: Diagnosis not present

## 2019-11-11 DIAGNOSIS — R918 Other nonspecific abnormal finding of lung field: Secondary | ICD-10-CM | POA: Diagnosis not present

## 2019-11-11 DIAGNOSIS — I700 Atherosclerosis of aorta: Secondary | ICD-10-CM | POA: Insufficient documentation

## 2019-11-11 DIAGNOSIS — J439 Emphysema, unspecified: Secondary | ICD-10-CM | POA: Diagnosis not present

## 2019-11-11 DIAGNOSIS — R911 Solitary pulmonary nodule: Secondary | ICD-10-CM | POA: Diagnosis not present

## 2019-11-11 DIAGNOSIS — I2510 Atherosclerotic heart disease of native coronary artery without angina pectoris: Secondary | ICD-10-CM | POA: Diagnosis not present

## 2019-11-11 LAB — GLUCOSE, CAPILLARY: Glucose-Capillary: 101 mg/dL — ABNORMAL HIGH (ref 70–99)

## 2019-11-11 MED ORDER — FLUDEOXYGLUCOSE F - 18 (FDG) INJECTION
6.1000 | Freq: Once | INTRAVENOUS | Status: AC | PRN
  Administered 2019-11-11: 6.74 via INTRAVENOUS

## 2019-11-16 ENCOUNTER — Other Ambulatory Visit: Payer: Self-pay

## 2019-11-16 ENCOUNTER — Ambulatory Visit (INDEPENDENT_AMBULATORY_CARE_PROVIDER_SITE_OTHER): Payer: Medicare Other | Admitting: Internal Medicine

## 2019-11-16 ENCOUNTER — Encounter: Payer: Self-pay | Admitting: Internal Medicine

## 2019-11-16 DIAGNOSIS — I700 Atherosclerosis of aorta: Secondary | ICD-10-CM | POA: Diagnosis not present

## 2019-11-16 DIAGNOSIS — J209 Acute bronchitis, unspecified: Secondary | ICD-10-CM

## 2019-11-16 DIAGNOSIS — R918 Other nonspecific abnormal finding of lung field: Secondary | ICD-10-CM

## 2019-11-16 DIAGNOSIS — F17210 Nicotine dependence, cigarettes, uncomplicated: Secondary | ICD-10-CM | POA: Diagnosis not present

## 2019-11-16 DIAGNOSIS — I6523 Occlusion and stenosis of bilateral carotid arteries: Secondary | ICD-10-CM

## 2019-11-16 DIAGNOSIS — J440 Chronic obstructive pulmonary disease with acute lower respiratory infection: Secondary | ICD-10-CM | POA: Diagnosis not present

## 2019-11-16 NOTE — Progress Notes (Signed)
[facility] [place], [place] [address]  Internal MEDICINE  Office Visit Note  Patient Name: Latoya Lynch  [id number]  [id number]  Date of Service: 11/17/2019   Complaints/HPI Pt is here for establishment of PCP. Chief Complaint  Patient presents with  . New Patient (Initial Visit)  . Depression  . COPD  . Hyperlipidemia   HPI  Pt is here for estbalishment of PCP. She has relocated form [place]. Has been followed by pulmonary CHEST CT shows: The right lower lobe infrahilar nodule with endobronchial component measures about 2.4 cm in diameter, and has a maximum SUV of 13.1, compatible with malignancy. Centrilobular emphysema. Biapical pleuroparenchymal scarring. Coronary, aortic arch, and branch vessel atherosclerotic vascular disease.  She is c/o chest congestion and cough, inhaler have been too expensive. She feels sob. Continues to smoke  Chronic back pain, does see ortho and has been getting steroid injections, she does not know if she has osteoporosis or not.   H/o aneurysm circle of willis, clipped in 2008. Recently pt has word find difficulty, might be overwhelmed with her diagnosis of lung cancer. Denies any memory problems CT scan ?? Head   Current Medication: Outpatient Encounter Medications as of 11/16/2019  Medication Sig  . acetaminophen (TYLENOL) 650 MG CR tablet Take 650 mg by mouth every 8 (eight) hours as needed for pain.  Marland Kitchen buPROPion (WELLBUTRIN SR) 150 MG 12 hr tablet Take 150 mg by mouth 2 (two) times daily.   Marland Kitchen EPINEPHrine (EPIPEN 2-PAK) 0.3 mg/0.3 mL IJ SOAJ injection Inject 0.3 mg into the muscle as needed for anaphylaxis.  Marland Kitchen loratadine (CLARITIN) 10 MG tablet Take 10 mg by mouth every morning.   . Melatonin 10 MG TABS Take 1 tablet by mouth at bedtime.  . meloxicam (MOBIC) 15 MG tablet Take 15 mg by mouth 3 (three) times daily.   . methocarbamol (ROBAXIN) 750 MG tablet Take 750 mg by mouth daily.  . naproxen sodium  (ALEVE) 220 MG tablet Take 440 mg by mouth every morning.   . Omega-3 1000 MG CAPS Take 1,000 mg by mouth daily.   . riboflavin (VITAMIN B-2) 100 MG TABS tablet Take 200 mg by mouth in the morning and at bedtime.   . [DISCONTINUED] Riboflavin (VITAMIN B-2 PO) Take by mouth.  Marland Kitchen ipratropium-albuterol (DUONEB) 0.5-2.5 (3) MG/3ML SOLN Use one neb treatment bid   No facility-administered encounter medications on file as of 11/16/2019.    Surgical History: Past Surgical History:  Procedure Laterality Date  . CATARACT EXTRACTION W/ INTRAOCULAR LENS  IMPLANT, BILATERAL    . 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 ELECTROMAGNETIC NAVIGATION BROCHOSCOPY Right 07/30/2018   Procedure: ELECTROMAGNETIC NAVIGATION BRONCHOSCOPY RIGHT;  Surgeon: Tyler Pita, MD;  Location: [facility] ORS;  Service: Cardiopulmonary;  Laterality: Right;  . ENDOBRONCHIAL ULTRASOUND Right 07/30/2018   Procedure: ENDOBRONCHIAL ULTRASOUND RIGHT;  Surgeon: Tyler Pita, MD;  Location: [facility] ORS;  Service: Cardiopulmonary;  Laterality: Right;  . retinal detatchment Right 05/2017  . TONSILLECTOMY    . TOTAL HIP ARTHROPLASTY Right 01/2015  . TOTAL HIP ARTHROPLASTY Left 05/2019   in [place]  . TUBAL LIGATION      Medical History: Past Medical History:  Diagnosis Date  . Arthritis   . COPD (chronic obstructive pulmonary disease) ([place])   . Depression   . History of methicillin resistant staphylococcus aureus (MRSA)   . Hyperlipidemia   . Osteoarthritis     Family History: Family History  Problem Relation Age of  Onset  . Heart attack Mother   . Cancer Maternal Grandmother 80       spine ca  . Heart attack Maternal Grandfather     Social History   Socioeconomic History  . Marital status: Widowed    Spouse name: Not on file  . Number of children: Not on file  . Years of education: Not on file  . Highest education level: Not on file  Occupational History  . Not on file  Tobacco Use  . Smoking status:  Current Every Day Smoker    Packs/day: 0.50    Years: 60.00    Pack years: 30.00    Types: Cigarettes  . Smokeless tobacco: Never Used  . Tobacco comment: about 15 a day  Vaping Use  . Vaping Use: Never used  Substance and Sexual Activity  . Alcohol use: Not Currently    Comment: once in a great while  . Drug use: Never  . Sexual activity: Not Currently  Other Topics Concern  . Not on file  Social History Narrative  . Not on file   Social Determinants of Health   Financial Resource Strain:   . Difficulty of Paying Living Expenses: Not on file  Food Insecurity:   . Worried About Charity fundraiser in the Last Year: Not on file  . Ran Out of Food in the Last Year: Not on file  Transportation Needs:   . Lack of Transportation (Medical): Not on file  . Lack of Transportation (Non-Medical): Not on file  Physical Activity:   . Days of Exercise per Week: Not on file  . Minutes of Exercise per Session: Not on file  Stress:   . Feeling of Stress : Not on file  Social Connections:   . Frequency of Communication with Friends and Family: Not on file  . Frequency of Social Gatherings with Friends and Family: Not on file  . Attends Religious Services: Not on file  . Active Member of Clubs or Organizations: Not on file  . Attends Archivist Meetings: Not on file  . Marital Status: Not on file  Intimate Partner Violence:   . Fear of Current or Ex-Partner: Not on file  . Emotionally Abused: Not on file  . Physically Abused: Not on file  . Sexually Abused: Not on file     Review of Systems  Constitutional: Negative for chills, diaphoresis and fatigue.  HENT: Negative for ear pain, postnasal drip and sinus pressure.   Eyes: Negative for photophobia, discharge, redness, itching and visual disturbance.  Respiratory: Negative for cough, shortness of breath and wheezing.   Cardiovascular: Negative for chest pain, palpitations and leg swelling.  Gastrointestinal: Negative  for abdominal pain, constipation, diarrhea, nausea and vomiting.  Genitourinary: Negative for dysuria and flank pain.  Musculoskeletal: Negative for arthralgias, back pain, gait problem and neck pain.  Skin: Negative for color change.  Allergic/Immunologic: Negative for environmental allergies and food allergies.  Neurological: Negative for dizziness and headaches.  Hematological: Does not bruise/bleed easily.  Psychiatric/Behavioral: Negative for agitation, behavioral problems (depression) and hallucinations.    Vital Signs: BP 122/82   Pulse 96   Temp 97.8 F (36.6 C)   Resp 16   Ht 5\' 2"  (1.575 m)   Wt 116 lb (52.6 kg)   SpO2 97%   BMI 21.22 kg/m    Physical Exam Constitutional:      General: She is not in acute distress.    Appearance: She is well-developed. She  is not diaphoretic.  HENT:     Head: Normocephalic and atraumatic.     Mouth/Throat:     Pharynx: No oropharyngeal exudate.  Eyes:     Pupils: Pupils are equal, round, and reactive to light.  Neck:     Thyroid: No thyromegaly.     Vascular: No JVD.     Trachea: No tracheal deviation.  Cardiovascular:     Rate and Rhythm: Normal rate and regular rhythm.     Heart sounds: Normal heart sounds. No murmur heard.  No friction rub. No gallop.   Pulmonary:     Effort: Pulmonary effort is normal. No respiratory distress.     Breath sounds: No wheezing or rales.  Chest:     Chest wall: No tenderness.  Abdominal:     General: Bowel sounds are normal.     Palpations: Abdomen is soft.  Musculoskeletal:        General: Normal range of motion.     Cervical back: Normal range of motion and neck supple.  Lymphadenopathy:     Cervical: No cervical adenopathy.  Skin:    General: Skin is warm and dry.  Neurological:     Mental Status: She is alert and oriented to person, place, and time.     Cranial Nerves: No cranial nerve deficit.  Psychiatric:        Behavior: Behavior normal.        Thought Content: Thought  content normal.        Judgment: Judgment normal.   Assessment/Plan: 1. Acute bronchitis with COPD ([place]) Pt is unable to afford her MDI, will try neb  - For home use only DME Nebulizer machine - ipratropium-albuterol (DUONEB) 0.5-2.5 (3) MG/3ML SOLN; Use one neb treatment bid  Dispense: 180 mL; Refill: 3  2. Carotid artery calcification, bilateral - US Carotid Bilateral; Future - Lipid Panel With LDL/HDL Ratio; Future  3. Tobacco dependence due to cigarettes - Encouraged cessation  4. Mass of lower lobe of right lung Pt is to see pulmonary ?? Bronchoscopy   5. Aortic atherosclerosis ([place]) - Will need to be on statin and ASA.   General Counseling: reanne nellums understanding of the findings of todays visit and agrees with plan of treatment. I have discussed any further diagnostic evaluation that may be needed or ordered today. We also reviewed her medications today. she has been encouraged to call the office with any questions or concerns that should arise related to todays visit.  Counseling: Smoking cessation counseling: 1. Pt acknowledges the risks of long term smoking, she will try to quite smoking. 2. Options for different medications including nicotine products, chewing gum, patch etc, Wellbutrin and Chantix is discussed 3. Goal and date of compete cessation is discussed 4. Total time spent in smoking cessation is 10 min.  Orders Placed This Encounter  Procedures  . For home use only DME Nebulizer machine  . US Carotid Bilateral  . Lipid Panel With LDL/HDL Ratio  . TSH  . T4, free    Meds ordered this encounter  Medications  . ipratropium-albuterol (DUONEB) 0.5-2.5 (3) MG/3ML SOLN    Sig: Use one neb treatment bid    Dispense:  180 mL    Refill:  3    Time spent:40 Minutes

## 2019-11-17 ENCOUNTER — Telehealth: Payer: Self-pay

## 2019-11-17 MED ORDER — IPRATROPIUM-ALBUTEROL 0.5-2.5 (3) MG/3ML IN SOLN: RESPIRATORY_TRACT | 3 refills | Status: DC

## 2019-11-17 NOTE — Telephone Encounter (Signed)
Called kelly for nebulizer and med and advised her order already placed in epic

## 2019-11-18 ENCOUNTER — Ambulatory Visit: Payer: Medicare Other | Admitting: Pulmonary Disease

## 2019-11-18 ENCOUNTER — Encounter: Payer: Self-pay | Admitting: Pulmonary Disease

## 2019-11-18 ENCOUNTER — Other Ambulatory Visit: Payer: Self-pay

## 2019-11-18 VITALS — BP 128/74 | HR 93 | Temp 98.0°F | Ht 62.0 in | Wt 118.2 lb

## 2019-11-18 DIAGNOSIS — F17210 Nicotine dependence, cigarettes, uncomplicated: Secondary | ICD-10-CM

## 2019-11-18 DIAGNOSIS — R918 Other nonspecific abnormal finding of lung field: Secondary | ICD-10-CM | POA: Diagnosis not present

## 2019-11-18 DIAGNOSIS — J449 Chronic obstructive pulmonary disease, unspecified: Secondary | ICD-10-CM | POA: Diagnosis not present

## 2019-11-18 NOTE — Patient Instructions (Signed)
We will see you back in 4 to 6 weeks  I have sent a referral for Dr. Baruch Gouty for evaluation of potential SBRT (radiation) to that area

## 2019-11-18 NOTE — Progress Notes (Signed)
Subjective:    Patient ID: Latoya Lynch, female    DOB: 04/25/1940, 79 y.o.   MRN: [id number]  HPI Latoya Lynch is a 79 year old current smoker (half to 1 pack of cigarettes per day) who presents for evaluation of a new right lower lobe mass.  Recall that the patient had previously been seen for a right lower lobe mass that required SBRT and resolved with this treatment.  She had navigational bronchoscopy that showed atypical cells on this mass.  This mass was found in September 2019 during a CT for evaluation for potential aortic aneurysm.  Delay of diagnosis of this mass occurred due to the patient's reluctance to undergo bronchoscopy.  He was first evaluated here on 27 March 2018 and subsequently underwent navigational bronchoscopy on May 2020.  She had also declined VATS biopsy/lobectomy for this mass.  She continued to decline surgery and eventually underwent SBRT.  As noted the mass responded to this treatment.  Most recently however she has been found to have a NEW right lower lobe infrahilar mass which has a endobronchial component.  On imaging of 28 October 2019 and confirmed by PET/CT on 11 November 2019 to be hypermetabolic.  The discussed with the patient that this is likely malignancy.  Again the patient is reluctant to undergo bronchoscopy and just wants to be evaluated for potential SBRT.  He has had other issues to include hip replacement and ongoing issues with hip and back pain.  Used to smoke anywhere between 1/2 to 1 pack of cigarettes per day.  She has not had any hemoptysis.  Previously she had been on Anoro Ellipta for COPD however due to cost this was switched to [facility] as needed by her primary care physician.  She voices no other complaint today.  She has not had any weight loss or anorexia.  No dyspnea.  Review of Systems A 10 point review of systems was performed and it is as noted above otherwise negative.  Allergies  Allergen Reactions   Other Anaphylaxis    All  nuts   Peanut Oil Anaphylaxis   Prochlorperazine Anaphylaxis   Current Meds  Medication Sig   acetaminophen (TYLENOL) 650 MG CR tablet Take 650 mg by mouth every 8 (eight) hours as needed for pain.   buPROPion (WELLBUTRIN SR) 150 MG 12 hr tablet Take 150 mg by mouth 2 (two) times daily.    EPINEPHrine (EPIPEN 2-PAK) 0.3 mg/0.3 mL IJ SOAJ injection Inject 0.3 mg into the muscle as needed for anaphylaxis.   ipratropium-albuterol (DUONEB) 0.5-2.5 (3) MG/3ML SOLN Use one neb treatment bid   loratadine (CLARITIN) 10 MG tablet Take 10 mg by mouth every morning.    Melatonin 10 MG TABS Take 1 tablet by mouth at bedtime.   meloxicam (MOBIC) 15 MG tablet Take 15 mg by mouth 3 (three) times daily.    methocarbamol (ROBAXIN) 750 MG tablet Take 750 mg by mouth daily.   naproxen sodium (ALEVE) 220 MG tablet Take 440 mg by mouth every morning.    Omega-3 1000 MG CAPS Take 1,000 mg by mouth daily.    riboflavin (VITAMIN B-2) 100 MG TABS tablet Take 200 mg by mouth in the morning and at bedtime.    Immunization History  Administered Date(s) Administered   Fluad Quad(high Dose 65+) 11/09/2018   Influenza,inj,Quad PF,6+ Mos 12/04/2017   Influenza-Unspecified 11/09/2018   PFIZER SARS-COV-2 Vaccination 07/07/2019, 07/29/2019   Pneumococcal Conjugate-13 12/27/2016   Pneumococcal Polysaccharide-23 01/17/2015   Zoster Recombinat (Shingrix) 06/06/2017,  09/15/2017   Social History   Tobacco Use   Smoking status: Current Every Day Smoker    Packs/day: 1.00    Years: 60.00    Pack years: 60.00    Types: Cigarettes   Smokeless tobacco: Never Used   Tobacco comment: about 5-10 cigarettes a day-11/18/2019  Substance Use Topics   Alcohol use: Not Currently    Comment: once in a great while       Objective:   Physical Exam BP 128/74 (BP Location: Left Arm, Cuff Size: Normal)    Pulse 93    Temp 98 F (36.7 C) (Temporal)    Ht 5\' 2"  (1.575 m)    Wt 118 lb 3.2 oz (53.6 kg)    SpO2  97%    BMI 21.62 kg/m  GENERAL: Well-developed, well-nourished woman who presents in transport chair due to musculoskeletal pain.  She does not have labored breathing.  Not ill-appearing. HEAD: Normocephalic, atraumatic.  EYES: Pupils equal, round, reactive to light.  No scleral icterus.  MOUTH: Nose/mouth/throat not examined due to masking requirements for COVID 19. NECK: Supple. No thyromegaly. Trachea midline. No JVD.  No adenopathy. PULMONARY: Good air entry bilaterally.  Coarse breath sounds otherwise, no adventitious sounds. CARDIOVASCULAR: S1 and S2. Regular rate and rhythm.  No rubs, murmurs or gallops heard. ABDOMEN: Benign MUSCULOSKELETAL: No joint deformity, no clubbing, no edema.  NEUROLOGIC: No overt focal deficit.  Gait not assessed as patient is on transport chair.  Speech is fluent. SKIN: Intact,warm,dry.  Limited exam, no rashes. PSYCH: Mood and behavior appropriate.  I have reviewed the available films independently.  The patient does have a new 2.5 x 2.4 cm right infrahilar mass/nodule.  This appears to have an endobronchial component.  PET/CT confirms high SUV concerning for malignancy.  I am unable to import representative images to illustrate.     Assessment & Plan:     ICD-10-CM   1. Mass of lower lobe of right lung  R91.8 Ambulatory referral to Radiation Oncology   Patient does not wish to have bronchoscopy.  Would like to have reevaluation by radiation oncology Referral to radiation oncology for potential SBRT  2. Stage 2 moderate COPD by GOLD classification ([place])  J44.9    She discontinued Anoro due to cost Has been placed on DuoNeb which she uses on a as needed basis  3. Tobacco dependence due to cigarettes  F17.210    Counseling with regards to discontinuation of smoking was done Total counseling time 3 to 5 minutes   Orders Placed This Encounter  Procedures   Ambulatory referral to Radiation Oncology    Referral Priority:   Routine    Referral Type:    Consultation    Referral Reason:   Specialty Services Required    Requested Specialty:   Radiation Oncology    Number of Visits Requested:   1   Discussion:  The patient has a new right lower lobe infrahilar nodule with endobronchial component that is 2.5 cm in diameter.  SUV on PET CT is suggestive of malignancy.  The patient continues to decline bronchoscopy which would be the preferred next step to diagnose her lesion.  She would like to be evaluated by radiation oncology for potential SBRT.  We discussed the potential benefits, limitations and complications of not proceeding with bronchoscopy to determine the nature of her lesion.  She still would like to defer bronchoscopy.  Should back in 4 to 6 weeks time.  Should she decide to proceed  with bronchoscopy we can set her up at that time.  Renold Don, MD [facility]   *This note was dictated using voice recognition software/Dragon.  Despite best efforts to proofread, errors can occur which can change the meaning.  Any change was purely unintentional.

## 2019-11-19 DIAGNOSIS — J449 Chronic obstructive pulmonary disease, unspecified: Secondary | ICD-10-CM | POA: Diagnosis not present

## 2019-11-23 ENCOUNTER — Ambulatory Visit: Payer: Medicare Other

## 2019-11-23 ENCOUNTER — Encounter: Payer: Self-pay | Admitting: Pulmonary Disease

## 2019-11-23 DIAGNOSIS — M25511 Pain in right shoulder: Secondary | ICD-10-CM | POA: Diagnosis not present

## 2019-11-26 ENCOUNTER — Telehealth: Payer: Self-pay | Admitting: Pulmonary Disease

## 2019-11-26 ENCOUNTER — Institutional Professional Consult (permissible substitution): Payer: Medicare Other | Admitting: Radiation Oncology

## 2019-11-26 ENCOUNTER — Encounter: Payer: Self-pay | Admitting: Radiation Oncology

## 2019-11-26 ENCOUNTER — Other Ambulatory Visit: Payer: Self-pay

## 2019-11-26 ENCOUNTER — Ambulatory Visit
Admission: RE | Admit: 2019-11-26 | Discharge: 2019-11-26 | Disposition: A | Discharge status: Home / Self Care | Payer: Medicare Other | Source: Ambulatory Visit | Attending: Radiation Oncology | Admitting: Radiation Oncology

## 2019-11-26 VITALS — BP 133/71 | HR 89 | Temp 97.2°F | Resp 16 | Wt 113.0 lb

## 2019-11-26 DIAGNOSIS — Z923 Personal history of irradiation: Secondary | ICD-10-CM | POA: Insufficient documentation

## 2019-11-26 DIAGNOSIS — R918 Other nonspecific abnormal finding of lung field: Secondary | ICD-10-CM | POA: Diagnosis not present

## 2019-11-26 DIAGNOSIS — R05 Cough: Secondary | ICD-10-CM | POA: Diagnosis not present

## 2019-11-26 DIAGNOSIS — M25511 Pain in right shoulder: Secondary | ICD-10-CM | POA: Diagnosis not present

## 2019-11-26 NOTE — Progress Notes (Signed)
Radiation Oncology Follow up Note OP NA old patient new area new right hilar lung mass  Name: Latoya Lynch   Date:   11/26/2019 MRN:  [id number] DOB: 18-Oct-1940    This 79 y.o. female presents to the clinic today for evaluation of new hypermetabolic growing mass in the right hilar region and patient previously treated SBRT.  To the right lower lobe a year and a half prior for primary bronchogenic carcinoma  REFERRING PROVIDER: Tyler Pita, MD  HPI: Patient is a 79 year old female well-known to department having received SBRT about a year and a half ago for right lower lobe primary bronchogenic carcinoma.  We have been tracking a new nodule in the right hilar region.  Which on serial CT scans is enlarging with endoluminal filling defect and peri-infarct vascular nodule in the right lower lobe favoring malignancy.  PET PET CT scan showed right lower lobe infrahilar nodule with endobronchial component measuring proxy 2.4 cm with an SUV of 13.1 compatible with malignancy.  No recurrent hypermetabolic [place] in the region of radiation therapy in the right lung base no evidence of hilar or mediastinal adenopathy.  Patient is undergone bronchoscopy in the past with negative cytology although endoscopic appearance of the lesion was consistent with malignancy.  Patient is status post hip surgery to her left hip in March 2021.  Patient has seen Dr. Patsey Berthold and does not wish to have bronchoscopy she is now referred for consideration of treatment.  She does have a mild slightly productive cough no hemoptysis.  COMPLICATIONS OF TREATMENT: none  FOLLOW UP COMPLIANCE: keeps appointments   PHYSICAL EXAM:  BP 133/71 (BP Location: Left Arm, Patient Position: Sitting)   Pulse 89   Temp (!) 97.2 F (36.2 C) (Tympanic)   Resp 16   Wt 113 lb (51.3 kg)   BMI 20.67 kg/m  Well-developed well-nourished patient in NAD. HEENT reveals PERLA, EOMI, discs not visualized.  Oral cavity is clear. No oral  mucosal lesions are identified. Neck is clear without evidence of cervical or supraclavicular adenopathy. Lungs are clear to A&P. Cardiac examination is essentially unremarkable with regular rate and rhythm without murmur rub or thrill. Abdomen is benign with no organomegaly or masses noted. Motor sensory and DTR levels are equal and symmetric in the upper and lower extremities. Cranial nerves II through XII are grossly intact. Proprioception is intact. No peripheral adenopathy or edema is identified. No motor or sensory levels are noted. Crude visual fields are within normal range.  RADIOLOGY RESULTS: Serial CT scans and PET CT scans reviewed  PLAN: Present time like to go ahead and offer SBRT to the new progressive right hilar lesion.  I believe we can go and do 60 Gy in 5 fractions.  Would use motion restriction as well as 4-dimensional treatment planning.  We will also monitor her pre-existing radiation doses to the remainder of the right lower lobe.  Risks and benefits of treatment including possible fatigue possible development of worsening of cough slight higher incidence of side effects based on previous SBRT order were discussed in detail with the patient.  I have personally set up and ordered CT simulation for next week.  Patient comprehends my recommendations well.  I would like to take this opportunity to thank you for allowing me to participate in the care of your patient.Noreene Filbert, MD

## 2019-12-01 ENCOUNTER — Ambulatory Visit: Payer: Medicare Other

## 2019-12-02 NOTE — Telephone Encounter (Signed)
Error

## 2019-12-04 ENCOUNTER — Ambulatory Visit
Admission: RE | Admit: 2019-12-04 | Discharge: 2019-12-04 | Disposition: A | Discharge status: Home / Self Care | Payer: Medicare Other | Source: Ambulatory Visit | Attending: Radiation Oncology | Admitting: Radiation Oncology

## 2019-12-04 DIAGNOSIS — C3431 Malignant neoplasm of lower lobe, right bronchus or lung: Secondary | ICD-10-CM | POA: Diagnosis not present

## 2019-12-04 DIAGNOSIS — Z510 Encounter for antineoplastic radiation therapy: Secondary | ICD-10-CM | POA: Diagnosis not present

## 2019-12-04 DIAGNOSIS — R918 Other nonspecific abnormal finding of lung field: Secondary | ICD-10-CM | POA: Diagnosis not present

## 2019-12-14 DIAGNOSIS — F17210 Nicotine dependence, cigarettes, uncomplicated: Secondary | ICD-10-CM | POA: Diagnosis not present

## 2019-12-14 DIAGNOSIS — Z510 Encounter for antineoplastic radiation therapy: Secondary | ICD-10-CM | POA: Diagnosis not present

## 2019-12-14 DIAGNOSIS — C3431 Malignant neoplasm of lower lobe, right bronchus or lung: Secondary | ICD-10-CM | POA: Diagnosis not present

## 2019-12-14 DIAGNOSIS — R911 Solitary pulmonary nodule: Secondary | ICD-10-CM | POA: Diagnosis not present

## 2019-12-16 ENCOUNTER — Ambulatory Visit
Admission: RE | Admit: 2019-12-16 | Discharge: 2019-12-16 | Disposition: A | Discharge status: Home / Self Care | Payer: Medicare Other | Source: Ambulatory Visit | Attending: Radiation Oncology | Admitting: Radiation Oncology

## 2019-12-16 DIAGNOSIS — F17210 Nicotine dependence, cigarettes, uncomplicated: Secondary | ICD-10-CM | POA: Diagnosis not present

## 2019-12-16 DIAGNOSIS — M47896 Other spondylosis, lumbar region: Secondary | ICD-10-CM | POA: Diagnosis not present

## 2019-12-16 DIAGNOSIS — M5416 Radiculopathy, lumbar region: Secondary | ICD-10-CM | POA: Diagnosis not present

## 2019-12-16 DIAGNOSIS — R911 Solitary pulmonary nodule: Secondary | ICD-10-CM | POA: Diagnosis not present

## 2019-12-16 DIAGNOSIS — C3431 Malignant neoplasm of lower lobe, right bronchus or lung: Secondary | ICD-10-CM | POA: Diagnosis not present

## 2019-12-16 DIAGNOSIS — Z510 Encounter for antineoplastic radiation therapy: Secondary | ICD-10-CM | POA: Diagnosis not present

## 2019-12-16 DIAGNOSIS — G894 Chronic pain syndrome: Secondary | ICD-10-CM | POA: Diagnosis not present

## 2019-12-17 ENCOUNTER — Ambulatory Visit: Payer: Medicare Other | Admitting: Pulmonary Disease

## 2019-12-17 ENCOUNTER — Ambulatory Visit
Admission: RE | Admit: 2019-12-17 | Discharge: 2019-12-17 | Disposition: A | Discharge status: Home / Self Care | Payer: Medicare Other | Source: Ambulatory Visit | Attending: Radiation Oncology | Admitting: Radiation Oncology

## 2019-12-17 DIAGNOSIS — Z510 Encounter for antineoplastic radiation therapy: Secondary | ICD-10-CM | POA: Diagnosis not present

## 2019-12-17 DIAGNOSIS — R911 Solitary pulmonary nodule: Secondary | ICD-10-CM | POA: Diagnosis not present

## 2019-12-17 DIAGNOSIS — C3431 Malignant neoplasm of lower lobe, right bronchus or lung: Secondary | ICD-10-CM | POA: Diagnosis not present

## 2019-12-17 DIAGNOSIS — F17210 Nicotine dependence, cigarettes, uncomplicated: Secondary | ICD-10-CM | POA: Diagnosis not present

## 2019-12-18 ENCOUNTER — Ambulatory Visit
Admission: RE | Admit: 2019-12-18 | Discharge: 2019-12-18 | Disposition: A | Discharge status: Home / Self Care | Payer: Medicare Other | Source: Ambulatory Visit | Attending: Radiation Oncology | Admitting: Radiation Oncology

## 2019-12-18 ENCOUNTER — Ambulatory Visit: Payer: Medicare Other

## 2019-12-18 ENCOUNTER — Other Ambulatory Visit: Payer: Medicare Other

## 2019-12-18 DIAGNOSIS — C3431 Malignant neoplasm of lower lobe, right bronchus or lung: Secondary | ICD-10-CM | POA: Diagnosis not present

## 2019-12-18 DIAGNOSIS — Z510 Encounter for antineoplastic radiation therapy: Secondary | ICD-10-CM | POA: Diagnosis not present

## 2019-12-18 DIAGNOSIS — J449 Chronic obstructive pulmonary disease, unspecified: Secondary | ICD-10-CM | POA: Diagnosis not present

## 2019-12-21 ENCOUNTER — Ambulatory Visit
Admission: RE | Admit: 2019-12-21 | Discharge: 2019-12-21 | Disposition: A | Discharge status: Home / Self Care | Payer: Medicare Other | Source: Ambulatory Visit | Attending: Radiation Oncology | Admitting: Radiation Oncology

## 2019-12-21 ENCOUNTER — Ambulatory Visit: Payer: Medicare Other

## 2019-12-21 DIAGNOSIS — Z510 Encounter for antineoplastic radiation therapy: Secondary | ICD-10-CM | POA: Diagnosis not present

## 2019-12-21 DIAGNOSIS — R911 Solitary pulmonary nodule: Secondary | ICD-10-CM | POA: Diagnosis not present

## 2019-12-21 DIAGNOSIS — F17210 Nicotine dependence, cigarettes, uncomplicated: Secondary | ICD-10-CM | POA: Diagnosis not present

## 2019-12-21 DIAGNOSIS — C3431 Malignant neoplasm of lower lobe, right bronchus or lung: Secondary | ICD-10-CM | POA: Diagnosis not present

## 2019-12-22 ENCOUNTER — Encounter: Payer: Self-pay | Admitting: Hospice and Palliative Medicine

## 2019-12-22 ENCOUNTER — Other Ambulatory Visit: Payer: Self-pay

## 2019-12-22 ENCOUNTER — Ambulatory Visit
Admission: RE | Admit: 2019-12-22 | Discharge: 2019-12-22 | Disposition: A | Discharge status: Home / Self Care | Payer: Medicare Other | Source: Ambulatory Visit | Attending: Radiation Oncology | Admitting: Radiation Oncology

## 2019-12-22 ENCOUNTER — Ambulatory Visit (INDEPENDENT_AMBULATORY_CARE_PROVIDER_SITE_OTHER): Payer: Medicare Other | Admitting: Hospice and Palliative Medicine

## 2019-12-22 DIAGNOSIS — I6523 Occlusion and stenosis of bilateral carotid arteries: Secondary | ICD-10-CM | POA: Diagnosis not present

## 2019-12-22 DIAGNOSIS — R918 Other nonspecific abnormal finding of lung field: Secondary | ICD-10-CM | POA: Diagnosis not present

## 2019-12-22 DIAGNOSIS — R911 Solitary pulmonary nodule: Secondary | ICD-10-CM | POA: Diagnosis not present

## 2019-12-22 DIAGNOSIS — C3431 Malignant neoplasm of lower lobe, right bronchus or lung: Secondary | ICD-10-CM | POA: Diagnosis not present

## 2019-12-22 DIAGNOSIS — R413 Other amnesia: Secondary | ICD-10-CM

## 2019-12-22 DIAGNOSIS — F17210 Nicotine dependence, cigarettes, uncomplicated: Secondary | ICD-10-CM

## 2019-12-22 DIAGNOSIS — Z0001 Encounter for general adult medical examination with abnormal findings: Secondary | ICD-10-CM

## 2019-12-22 DIAGNOSIS — Z510 Encounter for antineoplastic radiation therapy: Secondary | ICD-10-CM | POA: Diagnosis not present

## 2019-12-22 NOTE — Progress Notes (Signed)
[facility] [place], [place] [address]  Internal MEDICINE  Office Visit Note  Patient Name: Latoya Lynch  [id number]  [id number]  Date of Service: 12/25/2019  Chief Complaint  Patient presents with  . Medicare Wellness    memory problems, cant finish sentences but knows what she wants to say, balance is off  . Depression  . Hyperlipidemia  . COPD     HPI Pt is here for routine health maintenance examination She is currently undergoing radiation therapy for malignant right lower lobe lung mass--she decline bronchoscopy for further diagnosis and proceeded to undergo SBRT-she is on round 5/10 of radiation therapy She is tolerating radiation therapy--is having some memory loss, was told by her oncologist this is likely related to radiation therapy She reports her memory loss as being able to hold a conversation but forgetting common words, does eventually come back to her but this is happening frequently, no other reports of memory loss, no speech difficulty She does continue to smoke cigarettes, down to about half a pack a day, understands the need to quit  Scheduled for [place] of carotid in November  We discussed needing to obtain further labs such as lipid and thyroid panel, she is not wanting to have this down at this time as she is having labs drawn frequently at the cancer center She also says has been on statin therapy in the past, had multiple negative side effects and stopped taking this--was on statin therapy before she moved here and decided against taking it when she moved--she is open to having her levels checked once she has finished radiation but wanted to communicate up front she will not be interested in statin therapy  She also mentions at this time she feels she has enough going on with her health and will be more open to discussing her mammogram as well as colonoscopy once she has completed radiation  Current Medication: Outpatient  Encounter Medications as of 12/22/2019  Medication Sig  . acetaminophen (TYLENOL) 650 MG CR tablet Take 650 mg by mouth every 8 (eight) hours as needed for pain.  Marland Kitchen buPROPion (WELLBUTRIN SR) 150 MG 12 hr tablet Take 150 mg by mouth 2 (two) times daily.   Marland Kitchen EPINEPHrine (EPIPEN 2-PAK) 0.3 mg/0.3 mL IJ SOAJ injection Inject 0.3 mg into the muscle as needed for anaphylaxis.  Marland Kitchen ipratropium-albuterol (DUONEB) 0.5-2.5 (3) MG/3ML SOLN Use one neb treatment bid  . loratadine (CLARITIN) 10 MG tablet Take 10 mg by mouth every morning.   . Melatonin 10 MG TABS Take 1 tablet by mouth at bedtime.  . meloxicam (MOBIC) 15 MG tablet Take 15 mg by mouth 3 (three) times daily.   . methocarbamol (ROBAXIN) 750 MG tablet Take 750 mg by mouth daily.  . naproxen sodium (ALEVE) 220 MG tablet Take 440 mg by mouth every morning.   . Omega-3 1000 MG CAPS Take 1,000 mg by mouth daily.   . riboflavin (VITAMIN B-2) 100 MG TABS tablet Take 200 mg by mouth in the morning and at bedtime.   . vitamin B-12 (CYANOCOBALAMIN) 500 MCG tablet Take 500 mcg by mouth daily.   No facility-administered encounter medications on file as of 12/22/2019.    Surgical History: Past Surgical History:  Procedure Laterality Date  . CATARACT EXTRACTION W/ INTRAOCULAR LENS  IMPLANT, BILATERAL    . 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 ELECTROMAGNETIC NAVIGATION BROCHOSCOPY Right 07/30/2018   Procedure: ELECTROMAGNETIC NAVIGATION BRONCHOSCOPY RIGHT;  Surgeon: Tyler Pita, MD;  Location: [facility] ORS;  Service: Cardiopulmonary;  Laterality: Right;  . ENDOBRONCHIAL ULTRASOUND Right 07/30/2018   Procedure: ENDOBRONCHIAL ULTRASOUND RIGHT;  Surgeon: Tyler Pita, MD;  Location: [facility] ORS;  Service: Cardiopulmonary;  Laterality: Right;  . retinal detatchment Right 05/2017  . TONSILLECTOMY    . TOTAL HIP ARTHROPLASTY Right 01/2015  . TOTAL HIP ARTHROPLASTY Left 05/2019   in [place]  . TUBAL LIGATION      Medical History: Past Medical  History:  Diagnosis Date  . Arthritis   . COPD (chronic obstructive pulmonary disease) ([place])   . Depression   . History of methicillin resistant staphylococcus aureus (MRSA)   . Hyperlipidemia   . Lung mass    right lung  . Osteoarthritis     Family History: Family History  Problem Relation Age of Onset  . Heart attack Mother   . Cancer Maternal Grandmother 80       spine ca  . Heart attack Maternal Grandfather     Review of Systems  Constitutional: Negative for chills, diaphoresis and fatigue.  HENT: Negative for ear pain, postnasal drip and sinus pressure.   Eyes: Negative for photophobia, discharge, redness, itching and visual disturbance.  Respiratory: Negative for cough, shortness of breath and wheezing.   Cardiovascular: Negative for chest pain, palpitations and leg swelling.  Gastrointestinal: Negative for abdominal pain, constipation, diarrhea, nausea and vomiting.  Genitourinary: Negative for dysuria and flank pain.  Musculoskeletal: Negative for arthralgias, back pain, gait problem and neck pain.  Skin: Negative for color change.  Allergic/Immunologic: Negative for environmental allergies and food allergies.  Neurological: Negative for dizziness and headaches.       Has trouble remembering common words during a conversation  Hematological: Does not bruise/bleed easily.  Psychiatric/Behavioral: Negative for agitation, behavioral problems (depression) and hallucinations.     Vital Signs: BP 112/62   Pulse 86   Temp 97.9 F (36.6 C)   Resp 16   Ht 5\' 2"  (1.575 m)   Wt 118 lb 9.6 oz (53.8 kg)   SpO2 98%   BMI 21.69 kg/m    Physical Exam Vitals reviewed.  Constitutional:      Appearance: Normal appearance.  HENT:     Mouth/Throat:     Mouth: Mucous membranes are moist.     Pharynx: Oropharynx is clear.  Cardiovascular:     Rate and Rhythm: Normal rate and regular rhythm.     Pulses: Normal pulses.     Heart sounds: Normal heart sounds.  Pulmonary:      Effort: Pulmonary effort is normal.     Breath sounds: Normal breath sounds.  Chest:     Breasts:        Right: Normal.        Left: Normal.  Abdominal:     General: Abdomen is flat.  Musculoskeletal:        General: Normal range of motion.     Cervical back: Normal range of motion.  Skin:    General: Skin is warm.  Neurological:     General: No focal deficit present.     Mental Status: She is alert and oriented to person, place, and time. Mental status is at baseline.  Psychiatric:        Mood and Affect: Mood normal.        Behavior: Behavior normal.        Thought Content: Thought content normal.      LABS: Recent Results (from the past 2160 hour(s))  Comprehensive  metabolic panel     Status: Abnormal   Collection Time: 10/28/19 11:46 AM  Result Value Ref Range   Sodium 141 135 - 145 mmol/L   Potassium 3.7 3.5 - 5.1 mmol/L   Chloride 107 98 - 111 mmol/L   CO2 25 22 - 32 mmol/L   Glucose, Bld 103 (H) 70 - 99 mg/dL    Comment: Glucose reference range applies only to samples taken after fasting for at least 8 hours.   BUN 19 8 - 23 mg/dL   Creatinine, Ser 0.94 0.44 - 1.00 mg/dL   Calcium 8.9 8.9 - 10.3 mg/dL   Total Protein 6.7 6.5 - 8.1 g/dL   Albumin 3.9 3.5 - 5.0 g/dL   AST 26 15 - 41 U/L   ALT 20 0 - 44 U/L   Alkaline Phosphatase 75 38 - 126 U/L   Total Bilirubin 0.9 0.3 - 1.2 mg/dL   GFR calc non Af Amer 58 (L) >60 mL/min   GFR calc Af Amer >60 >60 mL/min   Anion gap 9 5 - 15    Comment: Performed at [facility], [address] [place], [place] [address]  CBC with Differential     Status: None   Collection Time: 10/28/19 11:46 AM  Result Value Ref Range   WBC 7.7 4.0 - 10.5 K/uL   RBC 4.27 3.87 - 5.11 MIL/uL   Hemoglobin 13.9 12.0 - 15.0 g/dL   HCT 39.7 36 - 46 %   MCV 93.0 80.0 - 100.0 fL   MCH 32.6 26.0 - 34.0 pg   MCHC 35.0 30.0 - 36.0 g/dL   RDW 14.3 11.5 - 15.5 %   Platelets 220 150 - 400 K/uL   nRBC 0.0 0.0 - 0.2 %   Neutrophils  Relative % 58 %   Neutro Abs 4.4 1.7 - 7.7 K/uL   Lymphocytes Relative 31 %   Lymphs Abs 2.4 0.7 - 4.0 K/uL   Monocytes Relative 7 %   Monocytes Absolute 0.6 0.1 - 1.0 K/uL   Eosinophils Relative 3 %   Eosinophils Absolute 0.2 0.0 - 0.5 K/uL   Basophils Relative 1 %   Basophils Absolute 0.1 0.0 - 0.1 K/uL   Immature Granulocytes 0 %   Abs Immature Granulocytes 0.02 0.00 - 0.07 K/uL    Comment: Performed at [facility], [address] [place], [place] [address]  Glucose, capillary     Status: Abnormal   Collection Time: 11/11/19  9:25 AM  Result Value Ref Range   Glucose-Capillary 101 (H) 70 - 99 mg/dL    Comment: Glucose reference range applies only to samples taken after fasting for at least 8 hours.    MMSE - Mini Mental State Exam 12/22/2019  Orientation to time 5  Orientation to Place 5  Registration 3  Attention/ Calculation 5  Recall 3  Language- name 2 objects 2  Language- repeat 1  Language- follow 3 step command 3  Language- read & follow direction 1  Write a sentence 0  Copy design 1  Total score 29       Fall Risk  12/22/2019 11/16/2019  Falls in the past year? 1 1  Number falls in past yr: 0 0  Injury with Fall? 1 1  Comment right shoulder -  Follow up Falls evaluation completed Falls evaluation completed    Depression screen [facility] 2/9 12/22/2019 11/16/2019  Decreased Interest 0 0  Down, Depressed, Hopeless 0 0  PHQ - 2  Score 0 0    Assessment/Plan: 1. Encounter for routine adult health examination with abnormal findings Well appearing 79 year old female, will discuss updating PHM at next visit per her request, she would like to complete her radiation therapy before undergoing any more testing  2. Mass of lower lobe of right lung Has completed 5/10 radiation therapy sessions, scheduled to be completed next week, will then follow-up with pulmonologist/oncologist for further management  3. Carotid artery calcification, bilateral Scheduled to  have bilateral carotid US in November, will review and adjust plan of care as needed  4. Tobacco dependence due to cigarettes Down to about 0.5 pack of cigarettes per day, she is willing to continue to work on complete cessation, not interested in medication therapy to assist with cessation  5. Intermittent memory loss Has recently had difficulty remembering common words during conversation--oncologist feels this is secondary to her radiation therapy Will monitor her symptoms once radiation is completed, if symptoms persist of worsen may need to consider further testing  General Counseling: idy rawling understanding of the findings of todays visit and agrees with plan of treatment. I have discussed any further diagnostic evaluation that may be needed or ordered today. We also reviewed her medications today. she has been encouraged to call the office with any questions or concerns that should arise related to todays visit.    Counseling: Smoking cessation counseling: 1. Pt acknowledges the risks of long term smoking, she will try to quite smoking. 2. Options for different medications including nicotine products, chewing gum, patch etc, Wellbutrin and Chantix is discussed 3. Goal and date of compete cessation is discussed 4. Total time spent in smoking cessation is 15 min.  Total time spent: 35 Minutes  Time spent includes review of chart, medications, test results, and follow up plan with the patient.   This patient was seen by Casey Burkitt AGNP-C Collaboration with Dr Lavera Guise as a part of collaborative care agreement   Tanna Furry. [facility]

## 2019-12-23 ENCOUNTER — Ambulatory Visit
Admission: RE | Admit: 2019-12-23 | Discharge: 2019-12-23 | Disposition: A | Discharge status: Home / Self Care | Payer: Medicare Other | Source: Ambulatory Visit | Attending: Radiation Oncology | Admitting: Radiation Oncology

## 2019-12-23 ENCOUNTER — Ambulatory Visit: Payer: Medicare Other

## 2019-12-23 DIAGNOSIS — R911 Solitary pulmonary nodule: Secondary | ICD-10-CM | POA: Diagnosis not present

## 2019-12-23 DIAGNOSIS — Z510 Encounter for antineoplastic radiation therapy: Secondary | ICD-10-CM | POA: Diagnosis not present

## 2019-12-23 DIAGNOSIS — F17210 Nicotine dependence, cigarettes, uncomplicated: Secondary | ICD-10-CM | POA: Diagnosis not present

## 2019-12-23 DIAGNOSIS — C3431 Malignant neoplasm of lower lobe, right bronchus or lung: Secondary | ICD-10-CM | POA: Diagnosis not present

## 2019-12-24 ENCOUNTER — Ambulatory Visit
Admission: RE | Admit: 2019-12-24 | Discharge: 2019-12-24 | Disposition: A | Discharge status: Home / Self Care | Payer: Medicare Other | Source: Ambulatory Visit | Attending: Radiation Oncology | Admitting: Radiation Oncology

## 2019-12-24 DIAGNOSIS — C3431 Malignant neoplasm of lower lobe, right bronchus or lung: Secondary | ICD-10-CM | POA: Diagnosis not present

## 2019-12-24 DIAGNOSIS — F17210 Nicotine dependence, cigarettes, uncomplicated: Secondary | ICD-10-CM | POA: Diagnosis not present

## 2019-12-24 DIAGNOSIS — R911 Solitary pulmonary nodule: Secondary | ICD-10-CM | POA: Diagnosis not present

## 2019-12-24 DIAGNOSIS — Z510 Encounter for antineoplastic radiation therapy: Secondary | ICD-10-CM | POA: Diagnosis not present

## 2019-12-25 ENCOUNTER — Encounter: Payer: Self-pay | Admitting: Hospice and Palliative Medicine

## 2019-12-25 ENCOUNTER — Ambulatory Visit
Admission: RE | Admit: 2019-12-25 | Discharge: 2019-12-25 | Disposition: A | Discharge status: Home / Self Care | Payer: Medicare Other | Source: Ambulatory Visit | Attending: Radiation Oncology | Admitting: Radiation Oncology

## 2019-12-25 DIAGNOSIS — R911 Solitary pulmonary nodule: Secondary | ICD-10-CM | POA: Diagnosis not present

## 2019-12-25 DIAGNOSIS — F17210 Nicotine dependence, cigarettes, uncomplicated: Secondary | ICD-10-CM | POA: Diagnosis not present

## 2019-12-25 DIAGNOSIS — Z510 Encounter for antineoplastic radiation therapy: Secondary | ICD-10-CM | POA: Diagnosis not present

## 2019-12-25 DIAGNOSIS — C3431 Malignant neoplasm of lower lobe, right bronchus or lung: Secondary | ICD-10-CM | POA: Diagnosis not present

## 2019-12-28 ENCOUNTER — Ambulatory Visit
Admission: RE | Admit: 2019-12-28 | Discharge: 2019-12-28 | Disposition: A | Discharge status: Home / Self Care | Payer: Medicare Other | Source: Ambulatory Visit | Attending: Radiation Oncology | Admitting: Radiation Oncology

## 2019-12-28 ENCOUNTER — Ambulatory Visit: Payer: Medicare Other

## 2019-12-28 DIAGNOSIS — Z510 Encounter for antineoplastic radiation therapy: Secondary | ICD-10-CM | POA: Diagnosis not present

## 2019-12-28 DIAGNOSIS — F17210 Nicotine dependence, cigarettes, uncomplicated: Secondary | ICD-10-CM | POA: Diagnosis not present

## 2019-12-28 DIAGNOSIS — C3431 Malignant neoplasm of lower lobe, right bronchus or lung: Secondary | ICD-10-CM | POA: Diagnosis not present

## 2019-12-28 DIAGNOSIS — R911 Solitary pulmonary nodule: Secondary | ICD-10-CM | POA: Diagnosis not present

## 2019-12-29 ENCOUNTER — Ambulatory Visit
Admission: RE | Admit: 2019-12-29 | Discharge: 2019-12-29 | Disposition: A | Discharge status: Home / Self Care | Payer: Medicare Other | Source: Ambulatory Visit | Attending: Radiation Oncology | Admitting: Radiation Oncology

## 2019-12-29 DIAGNOSIS — C3431 Malignant neoplasm of lower lobe, right bronchus or lung: Secondary | ICD-10-CM | POA: Diagnosis not present

## 2019-12-29 DIAGNOSIS — Z510 Encounter for antineoplastic radiation therapy: Secondary | ICD-10-CM | POA: Diagnosis not present

## 2019-12-29 DIAGNOSIS — F17210 Nicotine dependence, cigarettes, uncomplicated: Secondary | ICD-10-CM | POA: Diagnosis not present

## 2019-12-29 DIAGNOSIS — R911 Solitary pulmonary nodule: Secondary | ICD-10-CM | POA: Diagnosis not present

## 2019-12-30 ENCOUNTER — Ambulatory Visit: Payer: Medicare Other

## 2020-01-08 ENCOUNTER — Other Ambulatory Visit: Payer: Self-pay | Admitting: Hospice and Palliative Medicine

## 2020-01-08 ENCOUNTER — Telehealth: Payer: Self-pay

## 2020-01-08 DIAGNOSIS — Z0001 Encounter for general adult medical examination with abnormal findings: Secondary | ICD-10-CM

## 2020-01-12 DIAGNOSIS — E079 Disorder of thyroid, unspecified: Secondary | ICD-10-CM | POA: Diagnosis not present

## 2020-01-12 DIAGNOSIS — Z0001 Encounter for general adult medical examination with abnormal findings: Secondary | ICD-10-CM | POA: Diagnosis not present

## 2020-01-12 DIAGNOSIS — E782 Mixed hyperlipidemia: Secondary | ICD-10-CM | POA: Diagnosis not present

## 2020-01-12 DIAGNOSIS — D510 Vitamin B12 deficiency anemia due to intrinsic factor deficiency: Secondary | ICD-10-CM | POA: Diagnosis not present

## 2020-01-13 LAB — VITAMIN B12: Vitamin B-12: >2000 pg/mL — ABNORMAL HIGH (ref 232–1245)

## 2020-01-13 LAB — LIPID PANEL WITH LDL/HDL RATIO
Cholesterol, Total: 240 mg/dL — ABNORMAL HIGH (ref 100–199)
HDL: 54 mg/dL (ref >39)
LDL Chol Calc (NIH): 157 mg/dL — ABNORMAL HIGH (ref 0–99)
LDL/HDL Ratio: 2.9 ratio (ref 0.0–3.2)
Triglycerides: 159 mg/dL — ABNORMAL HIGH (ref 0–149)
VLDL Cholesterol Cal: 29 mg/dL (ref 5–40)

## 2020-01-13 LAB — TSH+FREE T4
Free T4: 1.06 ng/dL (ref 0.82–1.77)
TSH: 2.42 u[IU]/mL (ref 0.450–4.500)

## 2020-01-13 NOTE — Progress Notes (Signed)
Will discuss abnormal lipid panel at her upcoming appt. Scheduled for carotid US this week.

## 2020-01-15 ENCOUNTER — Other Ambulatory Visit: Payer: Medicare Other

## 2020-01-18 ENCOUNTER — Other Ambulatory Visit: Payer: Self-pay | Admitting: Internal Medicine

## 2020-01-18 DIAGNOSIS — J209 Acute bronchitis, unspecified: Secondary | ICD-10-CM

## 2020-01-18 DIAGNOSIS — J449 Chronic obstructive pulmonary disease, unspecified: Secondary | ICD-10-CM | POA: Diagnosis not present

## 2020-01-19 ENCOUNTER — Ambulatory Visit: Payer: Medicare Other | Admitting: Hospice and Palliative Medicine

## 2020-01-19 NOTE — Telephone Encounter (Signed)
Orders for labs placed

## 2020-02-01 DIAGNOSIS — M5416 Radiculopathy, lumbar region: Secondary | ICD-10-CM | POA: Diagnosis not present

## 2020-02-01 DIAGNOSIS — J449 Chronic obstructive pulmonary disease, unspecified: Secondary | ICD-10-CM | POA: Diagnosis not present

## 2020-02-05 ENCOUNTER — Encounter: Payer: Self-pay | Admitting: Radiation Oncology

## 2020-02-05 ENCOUNTER — Other Ambulatory Visit: Payer: Self-pay

## 2020-02-05 ENCOUNTER — Ambulatory Visit
Admission: RE | Admit: 2020-02-05 | Discharge: 2020-02-05 | Disposition: A | Discharge status: Home / Self Care | Payer: Medicare Other | Source: Ambulatory Visit | Attending: Radiation Oncology | Admitting: Radiation Oncology

## 2020-02-05 VITALS — BP 138/81 | HR 116 | Temp 98.2°F | Wt 119.0 lb

## 2020-02-05 DIAGNOSIS — R918 Other nonspecific abnormal finding of lung field: Secondary | ICD-10-CM

## 2020-02-05 NOTE — Progress Notes (Signed)
Radiation Oncology Follow up Note  Name: Latoya Lynch   Date:   02/05/2020 MRN:  [id number] DOB: April 17, 1940    This 79 y.o. female presents to the clinic today for 1 month follow-up status post salvage therapy to her right hilum and patient previously treated with SBRT to her right lower lobe over in a year and a half prior for primary bronchogenic carcinoma.Marland Kitchen  REFERRING PROVIDER: Lavera Guise, MD  HPI: Patient is a 79 year old female now out 1 month having completed salvage radiation therapy for new hypermetabolic mass growing the right hilar region and the patient previously treated with SBRT to right lower lobe over a year and a half prior seen today in routine follow-up she is doing well.  She has a mild nonproductive cough no hemoptysis or chest tightness.  She is not yet had any additional imaging..  COMPLICATIONS OF TREATMENT: none  FOLLOW UP COMPLIANCE: keeps appointments   PHYSICAL EXAM:  BP 138/81   Pulse (!) 116   Temp 98.2 F (36.8 C) (Tympanic)   Wt 119 lb (54 kg)   BMI 21.77 kg/m  Well-developed well-nourished patient in NAD. HEENT reveals PERLA, EOMI, discs not visualized.  Oral cavity is clear. No oral mucosal lesions are identified. Neck is clear without evidence of cervical or supraclavicular adenopathy. Lungs are clear to A&P. Cardiac examination is essentially unremarkable with regular rate and rhythm without murmur rub or thrill. Abdomen is benign with no organomegaly or masses noted. Motor sensory and DTR levels are equal and symmetric in the upper and lower extremities. Cranial nerves II through XII are grossly intact. Proprioception is intact. No peripheral adenopathy or edema is identified. No motor or sensory levels are noted. Crude visual fields are within normal range.  RADIOLOGY RESULTS: No current films to review  PLAN: Present time patient is doing well very low side effect profile from her recent salvage radiation therapy.  And pleased with her  overall progress.  She continues close follow-up care with Dr. Janese Banks who will be ordering her follow-up CT scans.  I will review them when they become available.  I have otherwise asked to see her back in 3 to 4 months for follow-up.  Patient knows to call with any concerns.  I would like to take this opportunity to thank you for allowing me to participate in the care of your patient.Noreene Filbert, MD

## 2020-02-15 ENCOUNTER — Telehealth: Payer: Self-pay | Admitting: *Deleted

## 2020-02-15 DIAGNOSIS — C3490 Malignant neoplasm of unspecified part of unspecified bronchus or lung: Secondary | ICD-10-CM

## 2020-02-15 NOTE — Telephone Encounter (Signed)
Patient called asking if she needs to have a CT before her appointment on the 28th. Please advise

## 2020-02-15 NOTE — Telephone Encounter (Signed)
Yes needs ct chest without contrast prior. Hayley can you please arrange?

## 2020-02-15 NOTE — Telephone Encounter (Signed)
Alonda just called pt and moved the md visit for another day after she has the scan. Pt is ok with this

## 2020-02-15 NOTE — Telephone Encounter (Signed)
Order for CT chest without placed. Pt to be scheduled for scan 1-2 days prior to follow up on 12/28.

## 2020-02-17 DIAGNOSIS — J449 Chronic obstructive pulmonary disease, unspecified: Secondary | ICD-10-CM | POA: Diagnosis not present

## 2020-02-23 ENCOUNTER — Telehealth: Payer: Self-pay

## 2020-02-23 ENCOUNTER — Ambulatory Visit (INDEPENDENT_AMBULATORY_CARE_PROVIDER_SITE_OTHER): Payer: Medicare Other | Admitting: Hospice and Palliative Medicine

## 2020-02-23 ENCOUNTER — Encounter: Payer: Self-pay | Admitting: Hospice and Palliative Medicine

## 2020-02-23 ENCOUNTER — Other Ambulatory Visit: Payer: Self-pay

## 2020-02-23 VITALS — BP 128/72 | HR 93 | Temp 97.8°F | Resp 16 | Ht 62.0 in | Wt 119.2 lb

## 2020-02-23 DIAGNOSIS — R42 Dizziness and giddiness: Secondary | ICD-10-CM

## 2020-02-23 DIAGNOSIS — E782 Mixed hyperlipidemia: Secondary | ICD-10-CM | POA: Diagnosis not present

## 2020-02-23 DIAGNOSIS — R4701 Aphasia: Secondary | ICD-10-CM

## 2020-02-23 MED ORDER — ROSUVASTATIN CALCIUM 5 MG PO TABS: 5.0000 mg | ORAL_TABLET | Freq: Every day | ORAL | 1 refills | Status: DC

## 2020-02-23 NOTE — Telephone Encounter (Signed)
Faxed cologuard 

## 2020-02-23 NOTE — Telephone Encounter (Signed)
-----   Message from Luiz Ochoa, NP sent at 02/23/2020 10:00 AM EST ----- She wants ColoGuard

## 2020-02-23 NOTE — Progress Notes (Signed)
[facility] [place], [place] [address]  Internal MEDICINE  Office Visit Note  Patient Name: Latoya Lynch  [id number]  [id number]  Date of Service: 02/23/2020  Chief Complaint  Patient presents with  . Follow-up    Difficult walking, feet go one way but pt is trying to walk straight or in a different direction than her feet  . Depression  . Hyperlipidemia  . 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 Scheduled for follow-up CT scan next week Tolerated radiation therapy fairly well, energy levels are slowly improving, appetite has improved Complaining of being off balance and dizziness, describes as trying to walk in one direction and her legs go the opposite way Denies any recent falls or injury to head Also continues to have problems finishing her sentences, forgets common words during conversation, has been ongoing since last visit and feels it is getting worse  Cancelled her carotid US appointment--had too many appointments at that time with radiation treatments Not wanting to update her mammogram at this time   Symptoms of hypoglycemia  Current Medication: Outpatient Encounter Medications as of 02/23/2020  Medication Sig  . buPROPion (WELLBUTRIN SR) 150 MG 12 hr tablet Take 150 mg by mouth 2 (two) times daily.   . cetirizine (KLS ALLER-TEC) 10 MG tablet Take 10 mg by mouth daily.  Marland Kitchen EPINEPHrine 0.3 mg/0.3 mL IJ SOAJ injection Inject 0.3 mg into the muscle as needed for anaphylaxis.  Marland Kitchen ipratropium-albuterol (DUONEB) 0.5-2.5 (3) MG/3ML SOLN USE 1 VIAL  IN  NEBULIZER TWICE  DAILY  . Melatonin 10 MG TABS Take 1 tablet by mouth at bedtime.  . meloxicam (MOBIC) 15 MG tablet Take 15 mg by mouth 3 (three) times daily.   . methocarbamol (ROBAXIN) 750 MG tablet Take 750 mg by mouth daily.  .  naproxen sodium (ALEVE) 220 MG tablet Take 440 mg by mouth every morning.   . Omega-3 1000 MG CAPS Take 1,000 mg by mouth daily.   . riboflavin (VITAMIN B-2) 100 MG TABS tablet Take 200 mg by mouth in the morning and at bedtime.   . vitamin B-12 (CYANOCOBALAMIN) 500 MCG tablet Take 500 mcg by mouth daily.  . rosuvastatin (CRESTOR) 5 MG tablet Take 1 tablet (5 mg total) by mouth daily.  . [DISCONTINUED] acetaminophen (TYLENOL) 650 MG CR tablet Take 650 mg by mouth every 8 (eight) hours as needed for pain. (Patient not taking: Reported on 02/23/2020)  . [DISCONTINUED] loratadine (CLARITIN) 10 MG tablet Take 10 mg by mouth every morning.  (Patient not taking: Reported on 02/23/2020)   No facility-administered encounter medications on file as of 02/23/2020.    Surgical History: Past Surgical History:  Procedure Laterality Date  . CATARACT EXTRACTION W/ INTRAOCULAR LENS  IMPLANT, BILATERAL    . 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 ELECTROMAGNETIC NAVIGATION BROCHOSCOPY Right 07/30/2018   Procedure: ELECTROMAGNETIC NAVIGATION BRONCHOSCOPY RIGHT;  Surgeon: Tyler Pita, MD;  Location: [facility] ORS;  Service: Cardiopulmonary;  Laterality: Right;  . ENDOBRONCHIAL ULTRASOUND Right 07/30/2018   Procedure: ENDOBRONCHIAL ULTRASOUND RIGHT;  Surgeon: Tyler Pita, MD;  Location: [facility] ORS;  Service: Cardiopulmonary;  Laterality: Right;  . retinal detatchment Right 05/2017  . TONSILLECTOMY    . TOTAL HIP ARTHROPLASTY Right 01/2015  . TOTAL HIP ARTHROPLASTY Left 05/2019   in [place]  . TUBAL LIGATION  Medical History: Past Medical History:  Diagnosis Date  . Arthritis   . COPD (chronic obstructive pulmonary disease) ([place])   . Depression   . History of methicillin resistant staphylococcus aureus (MRSA)   . Hyperlipidemia   . Lung mass    right lung  . Osteoarthritis     Family History: Family History  Problem Relation Age of Onset  . Heart attack Mother   . Cancer Maternal  Grandmother 80       spine ca  . Heart attack Maternal Grandfather     Social History   Socioeconomic History  . Marital status: Widowed    Spouse name: Not on file  . Number of children: Not on file  . Years of education: Not on file  . Highest education level: Not on file  Occupational History  . Not on file  Tobacco Use  . Smoking status: Current Every Day Smoker    Packs/day: 1.00    Years: 60.00    Pack years: 60.00    Types: Cigarettes  . Smokeless tobacco: Never Used  . Tobacco comment: about 5-10 cigarettes a day-11/18/2019  Vaping Use  . Vaping Use: Never used  Substance and Sexual Activity  . Alcohol use: Not Currently    Comment: once in a great while  . Drug use: Never  . Sexual activity: Not Currently  Other Topics Concern  . Not on file  Social History Narrative  . Not on file   Social Determinants of Health   Financial Resource Strain: Not on file  Food Insecurity: Not on file  Transportation Needs: Not on file  Physical Activity: Not on file  Stress: Not on file  Social Connections: Not on file  Intimate Partner Violence: Not on file    Review of Systems  Constitutional: Negative for chills, diaphoresis and fatigue.  HENT: Negative for ear pain, postnasal drip and sinus pressure.   Eyes: Negative for photophobia, discharge, redness, itching and visual disturbance.  Respiratory: Negative for cough, shortness of breath and wheezing.   Cardiovascular: Negative for chest pain, palpitations and leg swelling.  Gastrointestinal: Negative for abdominal pain, constipation, diarrhea, nausea and vomiting.  Genitourinary: Negative for dysuria and flank pain.  Musculoskeletal: Negative for arthralgias, back pain, gait problem and neck pain.  Skin: Negative for color change.  Allergic/Immunologic: Negative for environmental allergies and food allergies.  Neurological: Positive for speech difficulty. Negative for dizziness and headaches.       Off balance   Hematological: Does not bruise/bleed easily.  Psychiatric/Behavioral: Negative for agitation, behavioral problems (depression) and hallucinations.    Vital Signs: BP 128/72   Pulse 93   Temp 97.8 F (36.6 C)   Resp 16   Ht 5\' 2"  (1.575 m)   Wt 119 lb 3.2 oz (54.1 kg)   SpO2 98%   BMI 21.80 kg/m    Physical Exam Vitals reviewed.  Constitutional:      Appearance: Normal appearance. She is normal weight.  Cardiovascular:     Rate and Rhythm: Normal rate and regular rhythm.     Pulses: Normal pulses.     Heart sounds: Normal heart sounds.  Pulmonary:     Effort: Pulmonary effort is normal.     Breath sounds: Normal breath sounds.  Abdominal:     General: Abdomen is flat.     Palpations: Abdomen is soft.  Musculoskeletal:        General: Normal range of motion.  Skin:    General: Skin is  warm.  Neurological:     Mental Status: She is alert and oriented to person, place, and time.     Gait: Gait abnormal.     Comments: Expressive aphasia  Psychiatric:        Mood and Affect: Mood normal.        Behavior: Behavior normal.        Thought Content: Thought content normal.        Judgment: Judgment normal.    Assessment/Plan: 1. Mixed hyperlipidemia Start low dose rosuvastatin for abnormal lipid levels--discussed importance of rescheduling carotid US - rosuvastatin (CRESTOR) 5 MG tablet; Take 1 tablet (5 mg total) by mouth daily.  Dispense: 90 tablet; Refill: 1  2. Aphasia Will review results of head CT due to neurological symptoms--may warrant referral to neurologist - CT Head Wo Contrast; Future  3. Dizziness Will review results of head CT due to neurological symptoms--may warrant referral to neurologist - CT Head Wo Contrast; Future  General Counseling: jamacia jester understanding of the findings of todays visit and agrees with plan of treatment. I have discussed any further diagnostic evaluation that may be needed or ordered today. We also reviewed her  medications today. she has been encouraged to call the office with any questions or concerns that should arise related to todays visit.    Orders Placed This Encounter  Procedures  . CT Head Wo Contrast    Meds ordered this encounter  Medications  . rosuvastatin (CRESTOR) 5 MG tablet    Sig: Take 1 tablet (5 mg total) by mouth daily.    Dispense:  90 tablet    Refill:  1    Time spent: 30 Minutes Time spent includes review of chart, medications, test results and follow-up plan with the patient.  This patient was seen by Theodoro Grist AGNP-C in Collaboration with Dr Lavera Guise as a part of collaborative care agreement     Tanna Furry. Adan Beal AGNP-C Internal medicine

## 2020-02-24 DIAGNOSIS — M47896 Other spondylosis, lumbar region: Secondary | ICD-10-CM | POA: Diagnosis not present

## 2020-02-24 DIAGNOSIS — M5416 Radiculopathy, lumbar region: Secondary | ICD-10-CM | POA: Diagnosis not present

## 2020-02-24 DIAGNOSIS — G894 Chronic pain syndrome: Secondary | ICD-10-CM | POA: Diagnosis not present

## 2020-02-25 ENCOUNTER — Ambulatory Visit: Payer: Medicare Other

## 2020-02-29 ENCOUNTER — Encounter: Payer: Self-pay | Admitting: Hospice and Palliative Medicine

## 2020-03-01 ENCOUNTER — Ambulatory Visit: Payer: Medicare Other | Admitting: Oncology

## 2020-03-01 ENCOUNTER — Ambulatory Visit
Admission: RE | Admit: 2020-03-01 | Discharge: 2020-03-01 | Disposition: A | Discharge status: Home / Self Care | Payer: Medicare Other | Source: Ambulatory Visit | Attending: Oncology | Admitting: Oncology

## 2020-03-01 ENCOUNTER — Other Ambulatory Visit: Payer: Self-pay

## 2020-03-01 DIAGNOSIS — J432 Centrilobular emphysema: Secondary | ICD-10-CM | POA: Diagnosis not present

## 2020-03-01 DIAGNOSIS — C3490 Malignant neoplasm of unspecified part of unspecified bronchus or lung: Secondary | ICD-10-CM | POA: Insufficient documentation

## 2020-03-01 DIAGNOSIS — R059 Cough, unspecified: Secondary | ICD-10-CM | POA: Diagnosis not present

## 2020-03-07 ENCOUNTER — Inpatient Hospital Stay: Payer: Medicare Other | Admitting: Oncology

## 2020-03-08 DIAGNOSIS — Z1211 Encounter for screening for malignant neoplasm of colon: Secondary | ICD-10-CM | POA: Diagnosis not present

## 2020-03-08 DIAGNOSIS — Z1212 Encounter for screening for malignant neoplasm of rectum: Secondary | ICD-10-CM | POA: Diagnosis not present

## 2020-03-10 ENCOUNTER — Inpatient Hospital Stay: Payer: Medicare Other | Attending: Oncology | Admitting: Oncology

## 2020-03-10 ENCOUNTER — Encounter: Payer: Self-pay | Admitting: Oncology

## 2020-03-10 VITALS — BP 115/64 | HR 103 | Temp 98.9°F | Resp 18 | Wt 116.9 lb

## 2020-03-10 DIAGNOSIS — F32A Depression, unspecified: Secondary | ICD-10-CM | POA: Diagnosis not present

## 2020-03-10 DIAGNOSIS — Z791 Long term (current) use of non-steroidal anti-inflammatories (NSAID): Secondary | ICD-10-CM | POA: Diagnosis not present

## 2020-03-10 DIAGNOSIS — R918 Other nonspecific abnormal finding of lung field: Secondary | ICD-10-CM | POA: Insufficient documentation

## 2020-03-10 DIAGNOSIS — I2510 Atherosclerotic heart disease of native coronary artery without angina pectoris: Secondary | ICD-10-CM | POA: Diagnosis not present

## 2020-03-10 DIAGNOSIS — E785 Hyperlipidemia, unspecified: Secondary | ICD-10-CM | POA: Insufficient documentation

## 2020-03-10 DIAGNOSIS — J449 Chronic obstructive pulmonary disease, unspecified: Secondary | ICD-10-CM | POA: Diagnosis not present

## 2020-03-10 DIAGNOSIS — Z8249 Family history of ischemic heart disease and other diseases of the circulatory system: Secondary | ICD-10-CM | POA: Insufficient documentation

## 2020-03-10 DIAGNOSIS — Z08 Encounter for follow-up examination after completed treatment for malignant neoplasm: Secondary | ICD-10-CM

## 2020-03-10 DIAGNOSIS — F17210 Nicotine dependence, cigarettes, uncomplicated: Secondary | ICD-10-CM | POA: Insufficient documentation

## 2020-03-10 DIAGNOSIS — Z79899 Other long term (current) drug therapy: Secondary | ICD-10-CM | POA: Diagnosis not present

## 2020-03-10 DIAGNOSIS — Z85118 Personal history of other malignant neoplasm of bronchus and lung: Secondary | ICD-10-CM

## 2020-03-10 NOTE — Progress Notes (Signed)
Hematology/Oncology Consult note [facility]  Telephone:([phone] [phone]  Patient Care Team: Lavera Guise, MD as PCP - General (Internal Medicine) Telford Nab, RN as Registered Nurse   Name of the patient: Latoya Lynch  [id number]  03-18-1940   Date of visit: 03/10/20  Diagnosis- right lower lobe lung mass 1.7 cm presumptive diagnosis of lung cancer S/p SBRT  Chief complaint/ Reason for visit-discuss CT scan results  Heme/Onc history: Patient is a 80 year old female who was found to have a 2.1 cm right lower lobe lung mass.  N March 2020 and she underwent a bronchoscopy which did not reveal any malignancy.  Cytology was suspicious but not diagnostic for malignancy.  She then underwent SBRT for the same.  Subsequently in August 2021 she was found to have an endoluminal filling defect in the area of the right hilum.  She again underwent empiric radiation to this area which was hypermetabolic on PET CT scan measuring 2.4 cm.  She has not required any systemic chemotherapy so far  Interval history-patient presently reports doing well and denies any complaints at this time  ECOG PS- 1 Pain scale- 0   Review of systems- Review of Systems  Constitutional: Positive for malaise/fatigue. Negative for chills, fever and weight loss.  HENT: Negative for congestion, ear discharge and nosebleeds.   Eyes: Negative for blurred vision.  Respiratory: Negative for cough, hemoptysis, sputum production, shortness of breath and wheezing.   Cardiovascular: Negative for chest pain, palpitations, orthopnea and claudication.  Gastrointestinal: Negative for abdominal pain, blood in stool, constipation, diarrhea, heartburn, melena, nausea and vomiting.  Genitourinary: Negative for dysuria, flank pain, frequency, hematuria and urgency.  Musculoskeletal: Negative for back pain, joint pain and myalgias.  Skin: Negative for rash.  Neurological: Negative for  dizziness, tingling, focal weakness, seizures, weakness and headaches.  Endo/Heme/Allergies: Does not bruise/bleed easily.  Psychiatric/Behavioral: Negative for depression and suicidal ideas. The patient does not have insomnia.        Allergies  Allergen Reactions  . Other Anaphylaxis    All nuts  . Peanut Oil Anaphylaxis  . Prochlorperazine Anaphylaxis     Past Medical History:  Diagnosis Date  . Arthritis   . COPD (chronic obstructive pulmonary disease) ([place])   . Depression   . History of methicillin resistant staphylococcus aureus (MRSA)   . Hyperlipidemia   . Lung mass    right lung  . Osteoarthritis      Past Surgical History:  Procedure Laterality Date  . CATARACT EXTRACTION W/ INTRAOCULAR LENS  IMPLANT, BILATERAL    . 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 ELECTROMAGNETIC NAVIGATION BROCHOSCOPY Right 07/30/2018   Procedure: ELECTROMAGNETIC NAVIGATION BRONCHOSCOPY RIGHT;  Surgeon: Tyler Pita, MD;  Location: [facility] ORS;  Service: Cardiopulmonary;  Laterality: Right;  . ENDOBRONCHIAL ULTRASOUND Right 07/30/2018   Procedure: ENDOBRONCHIAL ULTRASOUND RIGHT;  Surgeon: Tyler Pita, MD;  Location: [facility] ORS;  Service: Cardiopulmonary;  Laterality: Right;  . retinal detatchment Right 05/2017  . TONSILLECTOMY    . TOTAL HIP ARTHROPLASTY Right 01/2015  . TOTAL HIP ARTHROPLASTY Left 05/2019   in [place]  . TUBAL LIGATION      Social History   Socioeconomic History  . Marital status: Widowed    Spouse name: Not on file  . Number of children: Not on file  . Years of education: Not on file  . Highest education level: Not on file  Occupational History  . Not on file  Tobacco Use  .  Smoking status: Current Every Day Smoker    Packs/day: 1.00    Years: 60.00    Pack years: 60.00    Types: Cigarettes  . Smokeless tobacco: Never Used  . Tobacco comment: about 5-10 cigarettes a day-11/18/2019  Vaping Use  . Vaping Use: Never used  Substance and Sexual  Activity  . Alcohol use: Not Currently    Comment: once in a great while  . Drug use: Never  . Sexual activity: Not Currently  Other Topics Concern  . Not on file  Social History Narrative  . Not on file   Social Determinants of Health   Financial Resource Strain: Not on file  Food Insecurity: Not on file  Transportation Needs: Not on file  Physical Activity: Not on file  Stress: Not on file  Social Connections: Not on file  Intimate Partner Violence: Not on file    Family History  Problem Relation Age of Onset  . Heart attack Mother   . Cancer Maternal Grandmother 80       spine ca  . Heart attack Maternal Grandfather      Current Outpatient Medications:  .  buPROPion (WELLBUTRIN SR) 150 MG 12 hr tablet, Take 150 mg by mouth 2 (two) times daily. , Disp: , Rfl:  .  cetirizine (ZYRTEC) 10 MG tablet, Take 10 mg by mouth daily., Disp: , Rfl:  .  EPINEPHrine 0.3 mg/0.3 mL IJ SOAJ injection, Inject 0.3 mg into the muscle as needed for anaphylaxis., Disp: , Rfl:  .  ipratropium-albuterol (DUONEB) 0.5-2.5 (3) MG/3ML SOLN, USE 1 VIAL  IN  NEBULIZER TWICE  DAILY, Disp: 180 mL, Rfl: 11 .  Melatonin 10 MG TABS, Take 1 tablet by mouth at bedtime., Disp: , Rfl:  .  meloxicam (MOBIC) 15 MG tablet, Take 15 mg by mouth 3 (three) times daily. , Disp: , Rfl:  .  methocarbamol (ROBAXIN) 750 MG tablet, Take 750 mg by mouth daily., Disp: , Rfl:  .  naproxen sodium (ALEVE) 220 MG tablet, Take 440 mg by mouth every morning. , Disp: , Rfl:  .  Omega-3 1000 MG CAPS, Take 1,000 mg by mouth daily. , Disp: , Rfl:  .  riboflavin (VITAMIN B-2) 100 MG TABS tablet, Take 200 mg by mouth in the morning and at bedtime. , Disp: , Rfl:  .  rosuvastatin (CRESTOR) 5 MG tablet, Take 1 tablet (5 mg total) by mouth daily., Disp: 90 tablet, Rfl: 1 .  vitamin B-12 (CYANOCOBALAMIN) 500 MCG tablet, Take 500 mcg by mouth daily., Disp: , Rfl:   Physical exam:  Vitals:   03/10/20 1005  BP: 115/64  Pulse: (!) 103   Resp: 18  Temp: 98.9 F (37.2 C)  TempSrc: Tympanic  SpO2: 98%  Weight: 116 lb 14.4 oz (53 kg)   Physical Exam Constitutional:      General: She is not in acute distress. Eyes:     Extraocular Movements: EOM normal.  Cardiovascular:     Rate and Rhythm: Normal rate and regular rhythm.     Heart sounds: Normal heart sounds.  Pulmonary:     Effort: Pulmonary effort is normal.     Breath sounds: Normal breath sounds.  Abdominal:     General: Bowel sounds are normal.     Palpations: Abdomen is soft.  Skin:    General: Skin is warm and dry.  Neurological:     Mental Status: She is alert and oriented to person, place, and time.  CMP Latest Ref Rng & Units 10/28/2019  Glucose 70 - 99 mg/dL 103(H)  BUN 8 - 23 mg/dL 19  Creatinine 0.44 - 1.00 mg/dL 0.94  Sodium 135 - 145 mmol/L 141  Potassium 3.5 - 5.1 mmol/L 3.7  Chloride 98 - 111 mmol/L 107  CO2 22 - 32 mmol/L 25  Calcium 8.9 - 10.3 mg/dL 8.9  Total Protein 6.5 - 8.1 g/dL 6.7  Total Bilirubin 0.3 - 1.2 mg/dL 0.9  Alkaline Phos 38 - 126 U/L 75  AST 15 - 41 U/L 26  ALT 0 - 44 U/L 20   CBC Latest Ref Rng & Units 10/28/2019  WBC 4.0 - 10.5 K/uL 7.7  Hemoglobin 12.0 - 15.0 g/dL 13.9  Hematocrit 36.0 - 46.0 % 39.7  Platelets 150 - 400 K/uL 220    No images are attached to the encounter.  CT Chest Wo Contrast  Result Date: 03/01/2020 CLINICAL DATA:  80 year old female with history of non-small cell lung cancer. Intermittent productive cough for the past 10 months. Follow-up study. EXAM: CT CHEST WITHOUT CONTRAST TECHNIQUE: Multidetector CT imaging of the chest was performed following the standard protocol without IV contrast. COMPARISON:  PET-CT 11/11/2019.  Chest CT 10/28/2019. FINDINGS: Cardiovascular: Heart size is normal. There is no significant pericardial fluid, thickening or pericardial calcification. There is aortic atherosclerosis, as well as atherosclerosis of the great vessels of the mediastinum and the  coronary arteries, including calcified atherosclerotic plaque in the left main, left anterior descending, left circumflex and right coronary arteries. Mediastinum/Nodes: No pathologically enlarged mediastinal or hilar lymph nodes. Please note that accurate exclusion of hilar adenopathy is limited on noncontrast CT scans. Esophagus is unremarkable in appearance. No axillary lymphadenopathy. Lungs/Pleura: When compared to the prior examination the central infrahilar lesion in the right lower lobe associated with the basal segmental bronchi has regressed and is now difficult to discretely visualize. There continues to be some narrowing of several of the bronchi in this region, suggesting some residual tumor, however, this is significantly improved. Several small pulmonary nodules in the basal segments of the right lower lobe are new compared to the prior study, measuring 5 mm or less in size. A few other scattered tiny 2-3 mm pulmonary nodules are noted throughout the lungs bilaterally, stable compared to prior examinations, nonspecific, but favored to be benign. Significant areas of atelectasis and/or scarring in are similar to the prior study. No acute consolidative airspace disease. No pleural effusions. Diffuse bronchial wall thickening with mild to moderate centrilobular and paraseptal emphysema. Bilateral apical areas of pleuroparenchymal thickening and architectural distortion, similar to the prior study, most compatible with areas of chronic post infectious or inflammatory scarring. Upper Abdomen: Aortic atherosclerosis. Musculoskeletal: There are no aggressive appearing lytic or blastic lesions noted in the visualized portions of the skeleton. IMPRESSION: 1. Today's study demonstrates positive response to therapy with regression of the central right lower lobe mass. There are some new pulmonary nodules in the right lower lobe measuring up to 5 mm in diameter which warrant close attention on follow-up imaging.  2. Diffuse bronchial wall thickening with mild to moderate centrilobular and paraseptal emphysema; imaging findings suggestive of underlying COPD. 3. Aortic atherosclerosis, in addition to left main and 3 vessel coronary artery disease. Assessment for potential risk factor modification, dietary therapy or pharmacologic therapy may be warranted, if clinically indicated. Aortic Atherosclerosis (ICD10-I70.0) and Emphysema (ICD10-J43.9). Electronically Signed   By: Vinnie Langton M.D.   On: 03/01/2020 10:45     Assessment and plan-  Patient is a 80 y.o. female with history of stage I presumptive diagnosis of lung cancer s/p SBRT here for routine follow-up  Patient is received SBRT to her right lower lobe lung mass back in August 2020 and then again was noted to have an endoluminal filling defect with a 2.4 cm mass again in the right lower lobe which was radiated.  Present CT scan shows interval reduction in the size of the mass.  No findings of progressive disease.  I have explained all these results to her.  I will see her back in 4 months with repeat CT chest without contrast prior   Visit Diagnosis 1. Encounter for follow-up surveillance of lung cancer      Dr. Randa Evens, MD, MPH [facility] at [facility] [phone] 03/10/2020 1:15 PM

## 2020-03-16 ENCOUNTER — Other Ambulatory Visit: Payer: Self-pay | Admitting: Hospice and Palliative Medicine

## 2020-03-17 ENCOUNTER — Telehealth: Payer: Self-pay | Admitting: Hospice and Palliative Medicine

## 2020-03-17 ENCOUNTER — Other Ambulatory Visit: Payer: Self-pay | Admitting: Hospice and Palliative Medicine

## 2020-03-17 NOTE — Telephone Encounter (Signed)
Left message on home phone machine for her to call me back. Need to discuss ColoGuard results.

## 2020-03-18 ENCOUNTER — Other Ambulatory Visit: Payer: Self-pay

## 2020-03-18 ENCOUNTER — Other Ambulatory Visit: Payer: Self-pay | Admitting: Hospice and Palliative Medicine

## 2020-03-18 DIAGNOSIS — Z1211 Encounter for screening for malignant neoplasm of colon: Secondary | ICD-10-CM

## 2020-03-18 NOTE — Progress Notes (Signed)
Called and spoke to patient regarding her positive ColoGuard testing. Order placed for GI referral.

## 2020-03-19 DIAGNOSIS — J449 Chronic obstructive pulmonary disease, unspecified: Secondary | ICD-10-CM | POA: Diagnosis not present

## 2020-03-23 ENCOUNTER — Ambulatory Visit: Payer: Medicare Other

## 2020-03-24 LAB — COLOGUARD

## 2020-04-05 ENCOUNTER — Ambulatory Visit
Admission: RE | Admit: 2020-04-05 | Discharge: 2020-04-05 | Disposition: A | Discharge status: Home / Self Care | Payer: Medicare Other | Source: Ambulatory Visit | Attending: Hospice and Palliative Medicine | Admitting: Hospice and Palliative Medicine

## 2020-04-05 ENCOUNTER — Other Ambulatory Visit: Payer: Self-pay

## 2020-04-05 DIAGNOSIS — R42 Dizziness and giddiness: Secondary | ICD-10-CM | POA: Diagnosis not present

## 2020-04-05 DIAGNOSIS — R4701 Aphasia: Secondary | ICD-10-CM

## 2020-04-08 DIAGNOSIS — H35373 Puckering of macula, bilateral: Secondary | ICD-10-CM | POA: Diagnosis not present

## 2020-04-08 NOTE — Progress Notes (Signed)
Please call and let patient know her CT head had no acute findings. There is evidence of chronic vascular disease--not the cause of her symptoms. Will discuss in more detail with her at next follow-up.

## 2020-04-11 ENCOUNTER — Telehealth: Payer: Self-pay

## 2020-04-11 NOTE — Telephone Encounter (Signed)
Please call and let patient know her CT head had no acute findings. There is evidence of chronic vascular disease--not the cause of her symptoms. Will discuss in more detail with her at next follow-up.    PT notified

## 2020-04-19 DIAGNOSIS — J449 Chronic obstructive pulmonary disease, unspecified: Secondary | ICD-10-CM | POA: Diagnosis not present

## 2020-04-26 ENCOUNTER — Ambulatory Visit (INDEPENDENT_AMBULATORY_CARE_PROVIDER_SITE_OTHER): Payer: Medicare Other | Admitting: Hospice and Palliative Medicine

## 2020-04-26 ENCOUNTER — Other Ambulatory Visit: Payer: Self-pay

## 2020-04-26 ENCOUNTER — Encounter: Payer: Self-pay | Admitting: Hospice and Palliative Medicine

## 2020-04-26 VITALS — BP 127/75 | HR 98 | Temp 97.6°F | Resp 16 | Ht 62.0 in | Wt 121.0 lb

## 2020-04-26 DIAGNOSIS — R4701 Aphasia: Secondary | ICD-10-CM

## 2020-04-26 DIAGNOSIS — I6523 Occlusion and stenosis of bilateral carotid arteries: Secondary | ICD-10-CM

## 2020-04-26 DIAGNOSIS — R42 Dizziness and giddiness: Secondary | ICD-10-CM

## 2020-04-26 DIAGNOSIS — L570 Actinic keratosis: Secondary | ICD-10-CM | POA: Diagnosis not present

## 2020-04-26 NOTE — Progress Notes (Signed)
[facility] [place], [place] [address]  Internal MEDICINE  Office Visit Note  Patient Name: Latoya Lynch  [id number]  [id number]  Date of Service: 04/27/2020  Chief Complaint  Patient presents with  . 2 month follow up    Review ct scan  . Depression  . 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 Continues to complain of expressive aphasia as well as feeling off balance with ambulation Has not had follow-up with neurology since aneurysm in 2004 Calcific atherosclerosis also identified on CT scan  Scheduled to see GI due to positive findings on Cologuard testing  Followed by oncology--has completed radiation therapy, follow-up CT scan demonstrated reduction in size of hilar mass, scheduled for repeat CT scan in 4 months Continue to feel overwhelmed with numerous appointments and testing Most concerned about difficulty with speech and dizziness  Current Medication: Outpatient Encounter Medications as of 04/26/2020  Medication Sig  . buPROPion (WELLBUTRIN SR) 150 MG 12 hr tablet Take 150 mg by mouth 2 (two) times daily.   . cetirizine (ZYRTEC) 10 MG tablet Take 10 mg by mouth daily.  Marland Kitchen EPINEPHrine 0.3 mg/0.3 mL IJ SOAJ injection Inject 0.3 mg into the muscle as needed for anaphylaxis.  Marland Kitchen ipratropium-albuterol (DUONEB) 0.5-2.5 (3) MG/3ML SOLN USE 1 VIAL  IN  NEBULIZER TWICE  DAILY  . Melatonin 10 MG TABS Take 1 tablet by mouth at bedtime.  . meloxicam (MOBIC) 15 MG tablet Take 15 mg by mouth 3 (three) times daily.   . methocarbamol (ROBAXIN) 750 MG tablet Take 750 mg by mouth daily.  . naproxen sodium (ALEVE) 220 MG tablet Take 440 mg by mouth every morning.   . Omega-3 1000 MG CAPS Take 1,000 mg by mouth daily.   . riboflavin (VITAMIN B-2) 100 MG  TABS tablet Take 200 mg by mouth in the morning and at bedtime.   . rosuvastatin (CRESTOR) 5 MG tablet Take 1 tablet (5 mg total) by mouth daily.  . vitamin B-12 (CYANOCOBALAMIN) 500 MCG tablet Take 500 mcg by mouth daily.   No facility-administered encounter medications on file as of 04/26/2020.    Surgical History: Past Surgical History:  Procedure Laterality Date  . CATARACT EXTRACTION W/ INTRAOCULAR LENS  IMPLANT, BILATERAL    . 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 ELECTROMAGNETIC NAVIGATION BROCHOSCOPY Right 07/30/2018   Procedure: ELECTROMAGNETIC NAVIGATION BRONCHOSCOPY RIGHT;  Surgeon: Tyler Pita, MD;  Location: [facility] ORS;  Service: Cardiopulmonary;  Laterality: Right;  . ENDOBRONCHIAL ULTRASOUND Right 07/30/2018   Procedure: ENDOBRONCHIAL ULTRASOUND RIGHT;  Surgeon: Tyler Pita, MD;  Location: [facility] ORS;  Service: Cardiopulmonary;  Laterality: Right;  . retinal detatchment Right 05/2017  . TONSILLECTOMY    . TOTAL HIP ARTHROPLASTY Right 01/2015  . TOTAL HIP ARTHROPLASTY Left 05/2019   in [place]  . TUBAL LIGATION      Medical History: Past Medical History:  Diagnosis Date  . Arthritis   . COPD (chronic obstructive pulmonary disease) ([place])   . Depression   . History of methicillin resistant staphylococcus aureus (MRSA)   . Hyperlipidemia   . Lung mass    right lung  . Osteoarthritis     Family History: Family History  Problem Relation Age of Onset  . Heart attack Mother   . Cancer Maternal Grandmother 77  spine ca  . Heart attack Maternal Grandfather     Social History   Socioeconomic History  . Marital status: Widowed    Spouse name: Not on file  . Number of children: Not on file  . Years of education: Not on file  . Highest education level: Not on file  Occupational History  . Not on file  Tobacco Use  . Smoking status: Current Every Day Smoker    Packs/day: 1.00    Years: 60.00    Pack years: 60.00    Types: Cigarettes  .  Smokeless tobacco: Never Used  . Tobacco comment: about 5-10 cigarettes a day-11/18/2019  Vaping Use  . Vaping Use: Never used  Substance and Sexual Activity  . Alcohol use: Not Currently    Comment: once in a great while  . Drug use: Never  . Sexual activity: Not Currently  Other Topics Concern  . Not on file  Social History Narrative  . Not on file   Social Determinants of Health   Financial Resource Strain: Not on file  Food Insecurity: Not on file  Transportation Needs: Not on file  Physical Activity: Not on file  Stress: Not on file  Social Connections: Not on file  Intimate Partner Violence: Not on file      Review of Systems  Constitutional: Negative for chills, diaphoresis and fatigue.  HENT: Negative for ear pain, postnasal drip and sinus pressure.   Eyes: Negative for photophobia, discharge, redness, itching and visual disturbance.  Respiratory: Negative for cough, shortness of breath and wheezing.   Cardiovascular: Negative for chest pain, palpitations and leg swelling.  Gastrointestinal: Negative for abdominal pain, constipation, diarrhea, nausea and vomiting.  Genitourinary: Negative for dysuria and flank pain.  Musculoskeletal: Negative for arthralgias, back pain, gait problem and neck pain.  Skin: Negative for color change.  Allergic/Immunologic: Negative for environmental allergies and food allergies.  Neurological: Positive for dizziness and speech difficulty. Negative for headaches.  Hematological: Does not bruise/bleed easily.  Psychiatric/Behavioral: Negative for agitation, behavioral problems (depression) and hallucinations.    Vital Signs: BP 127/75   Pulse 98   Temp 97.6 F (36.4 C)   Resp 16   Ht 5\' 2"  (1.575 m)   Wt 121 lb (54.9 kg)   SpO2 91%   BMI 22.13 kg/m    Physical Exam Vitals reviewed.  Constitutional:      Appearance: Normal appearance. She is normal weight.  Cardiovascular:     Rate and Rhythm: Normal rate and regular  rhythm.     Pulses: Normal pulses.     Heart sounds: Normal heart sounds.  Pulmonary:     Effort: Pulmonary effort is normal.     Breath sounds: Normal breath sounds.  Abdominal:     General: Abdomen is flat.     Palpations: Abdomen is soft.  Musculoskeletal:        General: Normal range of motion.     Cervical back: Normal range of motion.  Skin:    General: Skin is warm.     Comments: Scattered actinic keratoses  Neurological:     General: No focal deficit present.     Mental Status: She is alert and oriented to person, place, and time. Mental status is at baseline.     Comments: Expressive aphasia  Psychiatric:        Mood and Affect: Mood normal.        Behavior: Behavior normal.        Thought Content:  Thought content normal.        Judgment: Judgment normal.    Assessment/Plan: 1. Expressive aphasia Referral to neurology for further evaluation and management - Ambulatory referral to Neurology  2. Actinic keratosis Will need referral to dermatology--overwhelmed with various appointments at this time, requesting to hold off--will discuss at next follow-up appointment  3. Dizziness Referral to neurology for further evaluation and management, encouraged continued use with cane with ambulation to avoid falls - Ambulatory referral to Neurology  4. Carotid artery calcification, bilateral Continue with statin therapy, will need further vascular studies  General Counseling: nila winker understanding of the findings of todays visit and agrees with plan of treatment. I have discussed any further diagnostic evaluation that may be needed or ordered today. We also reviewed her medications today. she has been encouraged to call the office with any questions or concerns that should arise related to todays visit.    Orders Placed This Encounter  Procedures  . Ambulatory referral to Neurology    Time spent: 30 Minutes Time spent includes review of chart, medications,  test results and follow-up plan with the patient.  This patient was seen by Theodoro Grist AGNP-C in Collaboration with Dr Lavera Guise as a part of collaborative care agreement     Tanna Furry. Thamar Holik AGNP-C Internal medicine

## 2020-04-27 ENCOUNTER — Encounter: Payer: Self-pay | Admitting: Hospice and Palliative Medicine

## 2020-05-11 DIAGNOSIS — M5416 Radiculopathy, lumbar region: Secondary | ICD-10-CM | POA: Diagnosis not present

## 2020-05-11 DIAGNOSIS — M47896 Other spondylosis, lumbar region: Secondary | ICD-10-CM | POA: Diagnosis not present

## 2020-05-11 DIAGNOSIS — G894 Chronic pain syndrome: Secondary | ICD-10-CM | POA: Diagnosis not present

## 2020-05-17 DIAGNOSIS — J449 Chronic obstructive pulmonary disease, unspecified: Secondary | ICD-10-CM | POA: Diagnosis not present

## 2020-05-18 ENCOUNTER — Ambulatory Visit: Payer: Medicare Other | Admitting: Radiation Oncology

## 2020-05-23 ENCOUNTER — Ambulatory Visit
Admission: RE | Admit: 2020-05-23 | Discharge: 2020-05-23 | Disposition: A | Discharge status: Home / Self Care | Payer: Medicare Other | Source: Ambulatory Visit | Attending: Radiation Oncology | Admitting: Radiation Oncology

## 2020-05-23 ENCOUNTER — Other Ambulatory Visit: Payer: Self-pay

## 2020-05-23 VITALS — BP 153/76 | HR 97 | Temp 97.7°F | Wt 117.8 lb

## 2020-05-23 DIAGNOSIS — Z08 Encounter for follow-up examination after completed treatment for malignant neoplasm: Secondary | ICD-10-CM | POA: Diagnosis not present

## 2020-05-23 DIAGNOSIS — F17210 Nicotine dependence, cigarettes, uncomplicated: Secondary | ICD-10-CM | POA: Diagnosis not present

## 2020-05-23 DIAGNOSIS — C3431 Malignant neoplasm of lower lobe, right bronchus or lung: Secondary | ICD-10-CM

## 2020-05-23 DIAGNOSIS — R918 Other nonspecific abnormal finding of lung field: Secondary | ICD-10-CM | POA: Diagnosis not present

## 2020-05-23 NOTE — Progress Notes (Signed)
Radiation Oncology Follow up Note  Name: Latoya Lynch   Date:   05/23/2020 MRN:  [id number] DOB: August 09, 1940    This 80 y.o. female presents to the clinic today for 37-month follow-up status post salvage radiation therapy to the right hilum and patient previously treated with SBRT to right lower lobe close to 2 years prior for primary bronchogenic carcinoma.  REFERRING PROVIDER: Lavera Guise, MD  HPI: Patient is a 80 year old female now out 4 months from salvage radiation therapy to her chest for recurrence status post SBRT to her right lower lobe over a 2 years ago for primary bronchogenic carcinoma..  She is seen today in routine follow-up is doing well still has occasional cough no hemoptysis or chest tightness she is having some problems with ambulation and is using a cane for assistance.  She recently had a repeat CT scan of the chest back in January showing positive response to therapy with regression of the central right lower lobe mass.  There are some pulmonary nodules measuring up to 5 mm in diameter which worked attention.  CT scan of the head which I have also reviewed shows no  definitive evidence of acute intracranial abnormality.  COMPLICATIONS OF TREATMENT: none  FOLLOW UP COMPLIANCE: keeps appointments   PHYSICAL EXAM:  BP (!) 153/76   Pulse 97   Temp 97.7 F (36.5 C) (Tympanic)   Wt 117 lb 12.8 oz (53.4 kg)   SpO2 95% Comment: room air  BMI 21.55 kg/m  Well-developed well-nourished patient in NAD. HEENT reveals PERLA, EOMI, discs not visualized.  Oral cavity is clear. No oral mucosal lesions are identified. Neck is clear without evidence of cervical or supraclavicular adenopathy. Lungs are clear to A&P. Cardiac examination is essentially unremarkable with regular rate and rhythm without murmur rub or thrill. Abdomen is benign with no organomegaly or masses noted. Motor sensory and DTR levels are equal and symmetric in the upper and lower extremities. Cranial  nerves II through XII are grossly intact. Proprioception is intact. No peripheral adenopathy or edema is identified. No motor or sensory levels are noted. Crude visual fields are within normal range.  RADIOLOGY RESULTS: CT scan of chest and head reviewed compatible with above-stated findings  PLAN: Present time patient is doing well with no evidence of disease now out 4 months from salvage radiation therapy and pleased with her overall progress.  She is somewhat hypotensive and she will be seeing her PMD in the next couple weeks for that I have suggested drinking Gatorade.  I have asked to see her back in 6 months for follow-up she will have a CT scan of the chest prior to that which I will review.  Patient knows to call with any concerns.  I would like to take this opportunity to thank you for allowing me to participate in the care of your patient.Latoya Filbert, MD

## 2020-05-25 ENCOUNTER — Other Ambulatory Visit: Payer: Self-pay

## 2020-05-25 ENCOUNTER — Ambulatory Visit: Payer: Medicare Other | Admitting: Gastroenterology

## 2020-05-25 VITALS — BP 113/73 | HR 112 | Ht 62.0 in | Wt 121.0 lb

## 2020-05-25 DIAGNOSIS — R195 Other fecal abnormalities: Secondary | ICD-10-CM

## 2020-05-25 NOTE — Progress Notes (Signed)
Jonathon Bellows MD, MRCP(U.K) [address]  [place]  [place], [place] [address]  Main: [phone]  Fax: [phone]   Gastroenterology Consultation  Referring Provider:     Lavera Guise, MD Primary Care Physician:  Lavera Guise, MD Primary Gastroenterologist:  Dr. Jonathon Bellows  Reason for Consultation:   Positive Cologuard test        HPI:   Latoya Lynch is a 80 y.o. y/o female referred for consultation & management  by Dr. Humphrey Rolls, Timoteo Gaul, MD.    She has been referred to me to discuss about a colonoscopy.  She has a history of possible lung cancer diagnosed in March 2020.  She has undergone radiation treatment.  She underwent a Cologuard test in January 2020 which was positive.  She has been referred to discuss with me about the results.  PET scan in 11/23/2019 showed no abnormality in the GI tract but showed stool throughout the colon favoring constipation.  Hemoglobin in 10/23/2019 was 13.9 g.  She has never had a colonoscopy.  No family history of colon cancer or polyps.  No change in bowel habits.  No blood in the stools.  No change in the shape of her stool.  Past Medical History:  Diagnosis Date  . Arthritis   . COPD (chronic obstructive pulmonary disease) ([place])   . Depression   . History of methicillin resistant staphylococcus aureus (MRSA)   . Hyperlipidemia   . Lung mass    right lung  . Osteoarthritis     Past Surgical History:  Procedure Laterality Date  . CATARACT EXTRACTION W/ INTRAOCULAR LENS  IMPLANT, BILATERAL    . 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 ELECTROMAGNETIC NAVIGATION BROCHOSCOPY Right 07/30/2018   Procedure: ELECTROMAGNETIC NAVIGATION BRONCHOSCOPY RIGHT;  Surgeon: Tyler Pita, MD;  Location: [facility] ORS;  Service: Cardiopulmonary;  Laterality: Right;  . ENDOBRONCHIAL ULTRASOUND Right 07/30/2018   Procedure: ENDOBRONCHIAL ULTRASOUND RIGHT;  Surgeon: Tyler Pita, MD;  Location: [facility] ORS;  Service: Cardiopulmonary;   Laterality: Right;  . retinal detatchment Right 05/2017  . TONSILLECTOMY    . TOTAL HIP ARTHROPLASTY Right 01/2015  . TOTAL HIP ARTHROPLASTY Left 05/2019   in [place]  . TUBAL LIGATION      Prior to Admission medications   Medication Sig Start Date End Date Taking? Authorizing Provider  buPROPion (WELLBUTRIN SR) 150 MG 12 hr tablet Take 150 mg by mouth 2 (two) times daily.     [provider]  cetirizine (ZYRTEC) 10 MG tablet Take 10 mg by mouth daily.    [provider]  EPINEPHrine 0.3 mg/0.3 mL IJ SOAJ injection Inject 0.3 mg into the muscle as needed for anaphylaxis.    [provider]  ipratropium-albuterol (DUONEB) 0.5-2.5 (3) MG/3ML SOLN USE 1 VIAL  IN  NEBULIZER TWICE  DAILY 01/18/20   Lavera Guise, MD  Melatonin 10 MG TABS Take 1 tablet by mouth at bedtime. Patient not taking: Reported on 05/23/2020    [provider]  meloxicam (MOBIC) 15 MG tablet Take 15 mg by mouth 3 (three) times daily.  03/25/19   [provider]  methocarbamol (ROBAXIN) 750 MG tablet Take 750 mg by mouth daily.    [provider]  naproxen sodium (ALEVE) 220 MG tablet Take 440 mg by mouth every morning.     [provider]  Omega-3 1000 MG CAPS Take 1,000 mg by mouth daily.     [provider]  riboflavin (VITAMIN  B-2) 100 MG TABS tablet Take 200 mg by mouth in the morning and at bedtime.     [provider]  rosuvastatin (CRESTOR) 5 MG tablet Take 1 tablet (5 mg total) by mouth daily. 02/23/20   Luiz Ochoa, NP  vitamin B-12 (CYANOCOBALAMIN) 500 MCG tablet Take 500 mcg by mouth daily.    [provider]    Family History  Problem Relation Age of Onset  . Heart attack Mother   . Cancer Maternal Grandmother 80       spine ca  . Heart attack Maternal Grandfather      Social History   Tobacco Use  . Smoking status: Current Every Day Smoker    Packs/day: 1.00    Years: 60.00    Pack years: 60.00     Types: Cigarettes  . Smokeless tobacco: Never Used  . Tobacco comment: about 5-10 cigarettes a day-11/18/2019  Vaping Use  . Vaping Use: Never used  Substance Use Topics  . Alcohol use: Not Currently    Comment: once in a great while  . Drug use: Never    Allergies as of 05/25/2020 - Review Complete 05/25/2020  Allergen Reaction Noted  . Other Anaphylaxis 01/09/2018  . 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   Physical Exam:  BP 113/73   Pulse (!) 112   Ht 5\' 2"  (1.575 m)   Wt 121 lb (54.9 kg)   BMI 22.13 kg/m  No LMP recorded. Patient is postmenopausal. Psych:  Alert and cooperative. Normal mood and affect. General:   Alert,  Well-developed, well-nourished, pleasant and cooperative in NAD Neurologic:  Alert and oriented x3;  grossly normal neurologically. Skin:  Intact without significant lesions or rashes. No jaundice. Psych:  Alert and cooperative. Normal mood and affect.  Imaging Studies: No results found.  Assessment and Plan:   Latoya Lynch is a 80 y.o. y/o female has been referred for a positive Cologuard test.  Long discussion with the patient explaining that usually colon cancer screening is discontinued at the age of 21 unless expect the patient life expectancy to exceed 10 years and  done on an individual basis after a detailed discussion.  In her situation since the she is quite clear that she was not interested in any procedures.  Cologuard test was done we are left with a positive test and a tricky situation in terms of balancing the risks of the procedure versus benefits.  There are multiple reasons for the Cologuard test to be falsely positive ranging from internal hemorrhoids to bleeding from nonmalignant processes.  Obviously there is always a concern that it could be positive from a large polyp or from a neoplasm.  I  recommended that we perform a colonoscopy since she does have a positive stool test.  I have provided her information about the procedure and explained in detail.  She is presently not keen on it and wants to think about it.  I informed her that if she were to change her mind and decide to proceed, she can call my office and have it scheduled at any time  Follow up in as needed  Dr Jonathon Bellows MD,MRCP(U.K)

## 2020-06-06 DIAGNOSIS — J449 Chronic obstructive pulmonary disease, unspecified: Secondary | ICD-10-CM | POA: Diagnosis not present

## 2020-06-06 DIAGNOSIS — M5416 Radiculopathy, lumbar region: Secondary | ICD-10-CM | POA: Diagnosis not present

## 2020-06-08 ENCOUNTER — Telehealth: Payer: Self-pay

## 2020-06-08 NOTE — Telephone Encounter (Signed)
Pt called asking about referral to neurology, I gave her information for [facility] neurology and number to call them

## 2020-06-12 DIAGNOSIS — J209 Acute bronchitis, unspecified: Secondary | ICD-10-CM | POA: Diagnosis not present

## 2020-06-17 DIAGNOSIS — J449 Chronic obstructive pulmonary disease, unspecified: Secondary | ICD-10-CM | POA: Diagnosis not present

## 2020-06-29 ENCOUNTER — Ambulatory Visit: Payer: Medicare Other

## 2020-07-05 ENCOUNTER — Other Ambulatory Visit: Payer: Self-pay | Admitting: Neurology

## 2020-07-05 ENCOUNTER — Telehealth: Payer: Self-pay | Admitting: Oncology

## 2020-07-05 ENCOUNTER — Telehealth: Payer: Self-pay

## 2020-07-05 DIAGNOSIS — R296 Repeated falls: Secondary | ICD-10-CM | POA: Diagnosis not present

## 2020-07-05 DIAGNOSIS — R413 Other amnesia: Secondary | ICD-10-CM | POA: Diagnosis not present

## 2020-07-05 DIAGNOSIS — R251 Tremor, unspecified: Secondary | ICD-10-CM | POA: Diagnosis not present

## 2020-07-05 DIAGNOSIS — R4701 Aphasia: Secondary | ICD-10-CM | POA: Diagnosis not present

## 2020-07-05 DIAGNOSIS — R262 Difficulty in walking, not elsewhere classified: Secondary | ICD-10-CM | POA: Diagnosis not present

## 2020-07-05 DIAGNOSIS — Z720 Tobacco use: Secondary | ICD-10-CM | POA: Diagnosis not present

## 2020-07-05 DIAGNOSIS — R42 Dizziness and giddiness: Secondary | ICD-10-CM | POA: Diagnosis not present

## 2020-07-05 DIAGNOSIS — I639 Cerebral infarction, unspecified: Secondary | ICD-10-CM

## 2020-07-05 NOTE — Telephone Encounter (Signed)
Patient left message requesting to reschedule her CT scan on 5/5 to Latoya Lynch as she does not feel comfortable driving to [place].  She also requested to cancel her 5/6 MyChart visit with Dr. Janese Banks because the time does not work for her.  Routing to scheduler to follow up with patient.

## 2020-07-05 NOTE — Telephone Encounter (Signed)
Patient called stating she is scheduled for CT in [place], but she prefers location in [place]. Gave her tele # [phone] to have appointment moved-Toni

## 2020-07-06 ENCOUNTER — Telehealth: Payer: Self-pay | Admitting: Oncology

## 2020-07-06 ENCOUNTER — Telehealth: Payer: Self-pay | Admitting: *Deleted

## 2020-07-06 NOTE — Telephone Encounter (Signed)
Patient called stating that she needs to speak with Sonia Baller about her CT Scan and said that the earliest appointment they have is 5/20. She requests a return call

## 2020-07-06 NOTE — Telephone Encounter (Signed)
Patient called and requested to reschedule CT scan in [place] for the [place] location. Scan reschuelded-first available that works with patient's schedule (provides childcare for grandchildren) is on 5/20. MD f/u scheduled for 5/24. Mailing updated AVS.

## 2020-07-07 ENCOUNTER — Ambulatory Visit: Payer: Medicare Other

## 2020-07-08 ENCOUNTER — Inpatient Hospital Stay: Payer: Medicare Other | Admitting: Oncology

## 2020-07-17 DIAGNOSIS — J449 Chronic obstructive pulmonary disease, unspecified: Secondary | ICD-10-CM | POA: Diagnosis not present

## 2020-07-22 ENCOUNTER — Ambulatory Visit
Admission: RE | Admit: 2020-07-22 | Discharge: 2020-07-22 | Disposition: A | Discharge status: Home / Self Care | Payer: Medicare Other | Source: Ambulatory Visit | Attending: Oncology | Admitting: Oncology

## 2020-07-22 ENCOUNTER — Other Ambulatory Visit: Payer: Self-pay

## 2020-07-22 DIAGNOSIS — Z85118 Personal history of other malignant neoplasm of bronchus and lung: Secondary | ICD-10-CM | POA: Diagnosis not present

## 2020-07-22 DIAGNOSIS — Z08 Encounter for follow-up examination after completed treatment for malignant neoplasm: Secondary | ICD-10-CM | POA: Diagnosis not present

## 2020-07-22 DIAGNOSIS — C3431 Malignant neoplasm of lower lobe, right bronchus or lung: Secondary | ICD-10-CM | POA: Diagnosis not present

## 2020-07-22 DIAGNOSIS — J984 Other disorders of lung: Secondary | ICD-10-CM | POA: Diagnosis not present

## 2020-07-22 DIAGNOSIS — J439 Emphysema, unspecified: Secondary | ICD-10-CM | POA: Diagnosis not present

## 2020-07-22 DIAGNOSIS — I2510 Atherosclerotic heart disease of native coronary artery without angina pectoris: Secondary | ICD-10-CM | POA: Diagnosis not present

## 2020-07-25 ENCOUNTER — Telehealth: Payer: Self-pay | Admitting: *Deleted

## 2020-07-25 DIAGNOSIS — C787 Secondary malignant neoplasm of liver and intrahepatic bile duct: Secondary | ICD-10-CM

## 2020-07-25 NOTE — Telephone Encounter (Signed)
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 Dedicated CT examination of the abdomen pelvis with contrast is recommended for further evaluation.  Progressive bronchial wall thickening, marked narrowing of the central airways of the right middle lobe, and development of subpleural atelectasis or infiltrate within the posterior right upper lobe all possibly reflecting changes of radiation therapy. Superimposed extensive airway impaction is noted, particularly within the right lower lobe, and can be seen in the setting of acute infection and/or airway inflammation.  Stable post radiation changes within the right lower lobe.  Mild emphysema.  Extensive coronary artery calcification.  Aortic Atherosclerosis (ICD10-I70.0) and Emphysema (ICD10-J43.9).   Electronically Signed   By: Fidela Salisbury MD   On: 07/23/2020 23:01

## 2020-07-25 NOTE — Telephone Encounter (Signed)
Authorization pending at this time for PET scan. Once approved, will schedule pt.

## 2020-07-25 NOTE — Telephone Encounter (Signed)
-----   Message from Sindy Guadeloupe, MD sent at 07/24/2020  2:47 PM EDT ----- Vaughan Sine- I see her on 5/24. She will need pet scan asap followed by possible liver biopsy

## 2020-07-26 ENCOUNTER — Encounter: Payer: Self-pay | Admitting: Oncology

## 2020-07-26 ENCOUNTER — Other Ambulatory Visit: Payer: Self-pay

## 2020-07-26 ENCOUNTER — Inpatient Hospital Stay: Payer: Medicare Other | Attending: Oncology | Admitting: Oncology

## 2020-07-26 VITALS — BP 141/71 | HR 108 | Temp 98.2°F | Resp 22 | Wt 116.7 lb

## 2020-07-26 DIAGNOSIS — F17210 Nicotine dependence, cigarettes, uncomplicated: Secondary | ICD-10-CM | POA: Diagnosis not present

## 2020-07-26 DIAGNOSIS — Z923 Personal history of irradiation: Secondary | ICD-10-CM | POA: Insufficient documentation

## 2020-07-26 DIAGNOSIS — R160 Hepatomegaly, not elsewhere classified: Secondary | ICD-10-CM | POA: Diagnosis not present

## 2020-07-26 DIAGNOSIS — J989 Respiratory disorder, unspecified: Secondary | ICD-10-CM | POA: Insufficient documentation

## 2020-07-26 DIAGNOSIS — Z9889 Other specified postprocedural states: Secondary | ICD-10-CM | POA: Insufficient documentation

## 2020-07-26 DIAGNOSIS — Z79899 Other long term (current) drug therapy: Secondary | ICD-10-CM | POA: Insufficient documentation

## 2020-07-26 DIAGNOSIS — I351 Nonrheumatic aortic (valve) insufficiency: Secondary | ICD-10-CM | POA: Insufficient documentation

## 2020-07-26 DIAGNOSIS — H269 Unspecified cataract: Secondary | ICD-10-CM | POA: Insufficient documentation

## 2020-07-26 DIAGNOSIS — I671 Cerebral aneurysm, nonruptured: Secondary | ICD-10-CM | POA: Insufficient documentation

## 2020-07-26 DIAGNOSIS — Z85118 Personal history of other malignant neoplasm of bronchus and lung: Secondary | ICD-10-CM | POA: Diagnosis not present

## 2020-07-26 DIAGNOSIS — M1612 Unilateral primary osteoarthritis, left hip: Secondary | ICD-10-CM | POA: Insufficient documentation

## 2020-07-26 DIAGNOSIS — K7689 Other specified diseases of liver: Secondary | ICD-10-CM | POA: Diagnosis not present

## 2020-07-26 NOTE — Addendum Note (Signed)
Addended by: Kern Alberta on: 07/26/2020 03:15 PM   Modules accepted: Orders

## 2020-07-26 NOTE — Progress Notes (Signed)
Hematology/Oncology Consult note [facility]  Telephone:([phone] Fax:([phone]) [phone]  Patient Care Team: Lavera Guise, MD as PCP - General (Internal Medicine) Telford Nab, RN as Registered Nurse   Name of the patient: Latoya Lynch  [id number]  1941/01/18   Date of visit: 07/26/20  Diagnosis-history of lung cancer  Chief complaint/ Reason for visit-discuss CT scan results and further management  Heme/Onc history: Patient is a 80 year old female who was found to have a 2.1 cm right lower lobe lung mass.  N March 2020 and she underwent a bronchoscopy which did not reveal any malignancy.  Cytology was suspicious but not diagnostic for malignancy.  She then underwent SBRT for the same.  Subsequently in August 2021 she was found to have an endoluminal filling defect in the area of the right hilum.  She again underwent empiric radiation to this area which was hypermetabolic on PET CT scan measuring 2.4 cm.  She has not required any systemic chemotherapy so far   Interval history-patient is doing well perjeta radiation abdomen mild ongoing fatigue she denies any other complaints so far.  Appetite and weight have remained stable.  Denies any new aches and pains anywhere  ECOG PS- 1 Pain scale- 0   Review of systems- Review of Systems  Constitutional: Positive for malaise/fatigue. Negative for chills, fever and weight loss.  HENT: Negative for congestion, ear discharge and nosebleeds.   Eyes: Negative for blurred vision.  Respiratory: Negative for cough, hemoptysis, sputum production, shortness of breath and wheezing.   Cardiovascular: Negative for chest pain, palpitations, orthopnea and claudication.  Gastrointestinal: Negative for abdominal pain, blood in stool, constipation, diarrhea, heartburn, melena, nausea and vomiting.  Genitourinary: Negative for dysuria, flank pain, frequency, hematuria and urgency.  Musculoskeletal: Negative for back  pain, joint pain and myalgias.  Skin: Negative for rash.  Neurological: Negative for dizziness, tingling, focal weakness, seizures, weakness and headaches.  Endo/Heme/Allergies: Does not bruise/bleed easily.  Psychiatric/Behavioral: Negative for depression and suicidal ideas. The patient does not have insomnia.      Allergies  Allergen Reactions  . Other Anaphylaxis    All nuts  . Peanut Oil Anaphylaxis  . Prochlorperazine Anaphylaxis     Past Medical History:  Diagnosis Date  . Arthritis   . COPD (chronic obstructive pulmonary disease) ([place])   . Depression   . History of methicillin resistant staphylococcus aureus (MRSA)   . Hyperlipidemia   . Lung mass    right lung  . Osteoarthritis      Past Surgical History:  Procedure Laterality Date  . CATARACT EXTRACTION W/ INTRAOCULAR LENS  IMPLANT, BILATERAL    . 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 ELECTROMAGNETIC NAVIGATION BROCHOSCOPY Right 07/30/2018   Procedure: ELECTROMAGNETIC NAVIGATION BRONCHOSCOPY RIGHT;  Surgeon: Tyler Pita, MD;  Location: [facility] ORS;  Service: Cardiopulmonary;  Laterality: Right;  . ENDOBRONCHIAL ULTRASOUND Right 07/30/2018   Procedure: ENDOBRONCHIAL ULTRASOUND RIGHT;  Surgeon: Tyler Pita, MD;  Location: [facility] ORS;  Service: Cardiopulmonary;  Laterality: Right;  . retinal detatchment Right 05/2017  . TONSILLECTOMY    . TOTAL HIP ARTHROPLASTY Right 01/2015  . TOTAL HIP ARTHROPLASTY Left 05/2019   in [place]  . TUBAL LIGATION      Social History   Socioeconomic History  . Marital status: Widowed    Spouse name: Not on file  . Number of children: Not on file  . Years of education: Not on file  . Highest education level: Not on file  Occupational History  . Not on file  Tobacco Use  . Smoking status: Current Every Day Smoker    Packs/day: 1.00    Years: 60.00    Pack years: 60.00    Types: Cigarettes  . Smokeless tobacco: Never Used  . Tobacco comment: about 5-10 cigarettes  a day-11/18/2019  Vaping Use  . Vaping Use: Never used  Substance and Sexual Activity  . Alcohol use: Not Currently    Comment: once in a great while  . Drug use: Never  . Sexual activity: Not Currently  Other Topics Concern  . Not on file  Social History Narrative  . Not on file   Social Determinants of Health   Financial Resource Strain: Not on file  Food Insecurity: Not on file  Transportation Needs: Not on file  Physical Activity: Not on file  Stress: Not on file  Social Connections: Not on file  Intimate Partner Violence: Not on file    Family History  Problem Relation Age of Onset  . Heart attack Mother   . Cancer Maternal Grandmother 80       spine ca  . Heart attack Maternal Grandfather      Current Outpatient Medications:  .  buPROPion (WELLBUTRIN SR) 150 MG 12 hr tablet, Take 150 mg by mouth 2 (two) times daily. , Disp: , Rfl:  .  cetirizine (ZYRTEC) 10 MG tablet, Take 10 mg by mouth daily., Disp: , Rfl:  .  EPINEPHrine 0.3 mg/0.3 mL IJ SOAJ injection, Inject 0.3 mg into the muscle as needed for anaphylaxis., Disp: , Rfl:  .  ipratropium-albuterol (DUONEB) 0.5-2.5 (3) MG/3ML SOLN, USE 1 VIAL  IN  NEBULIZER TWICE  DAILY, Disp: 180 mL, Rfl: 11 .  meloxicam (MOBIC) 15 MG tablet, Take 15 mg by mouth 3 (three) times daily. , Disp: , Rfl:  .  methocarbamol (ROBAXIN) 750 MG tablet, Take 750 mg by mouth daily., Disp: , Rfl:  .  naproxen sodium (ALEVE) 220 MG tablet, Take 440 mg by mouth every morning. , Disp: , Rfl:  .  Omega-3 1000 MG CAPS, Take 1,000 mg by mouth daily. , Disp: , Rfl:  .  riboflavin (VITAMIN B-2) 100 MG TABS tablet, Take 200 mg by mouth in the morning and at bedtime. , Disp: , Rfl:  .  rosuvastatin (CRESTOR) 5 MG tablet, Take 1 tablet (5 mg total) by mouth daily., Disp: 90 tablet, Rfl: 1 .  vitamin B-12 (CYANOCOBALAMIN) 500 MCG tablet, Take 500 mcg by mouth daily., Disp: , Rfl:   Physical exam:  Vitals:   07/26/20 1012  BP: (!) 141/71  Pulse: (!)  108  Resp: (!) 22  Temp: 98.2 F (36.8 C)  TempSrc: Tympanic  SpO2: 94%  Weight: 116 lb 11.2 oz (52.9 kg)   Physical Exam Constitutional:      General: She is not in acute distress. Cardiovascular:     Rate and Rhythm: Normal rate and regular rhythm.     Heart sounds: Normal heart sounds.  Pulmonary:     Effort: Pulmonary effort is normal.     Breath sounds: Normal breath sounds.  Abdominal:     General: Bowel sounds are normal.     Palpations: Abdomen is soft.  Skin:    General: Skin is warm and dry.  Neurological:     Mental Status: She is alert and oriented to person, place, and time.      CMP Latest Ref Rng & Units 10/28/2019  Glucose 70 -  99 mg/dL 103(H)  BUN 8 - 23 mg/dL 19  Creatinine 0.44 - 1.00 mg/dL 0.94  Sodium 135 - 145 mmol/L 141  Potassium 3.5 - 5.1 mmol/L 3.7  Chloride 98 - 111 mmol/L 107  CO2 22 - 32 mmol/L 25  Calcium 8.9 - 10.3 mg/dL 8.9  Total Protein 6.5 - 8.1 g/dL 6.7  Total Bilirubin 0.3 - 1.2 mg/dL 0.9  Alkaline Phos 38 - 126 U/L 75  AST 15 - 41 U/L 26  ALT 0 - 44 U/L 20   CBC Latest Ref Rng & Units 10/28/2019  WBC 4.0 - 10.5 K/uL 7.7  Hemoglobin 12.0 - 15.0 g/dL 13.9  Hematocrit 36.0 - 46.0 % 39.7  Platelets 150 - 400 K/uL 220    No images are attached to the encounter.  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 Intermittent productive cough x1 year. Current smoker with 60 pack-year smoking history. EXAM: CT CHEST WITHOUT CONTRAST TECHNIQUE: Multidetector CT imaging of the chest was performed following the standard protocol without IV contrast. COMPARISON:  03/01/2020, 10/28/2019, PET CT 11/11/2019 FINDINGS: Cardiovascular: Extensive multi-vessel coronary artery calcification. Global cardiac size is within normal limits. No pericardial effusion. The central pulmonary arteries are of normal caliber. The  thoracic aorta is of normal caliber. Moderate atherosclerotic calcification is seen throughout the thoracic aorta. Mediastinum/Nodes: Stable 6 mm nodule within the left thyroid lobe. No pathologic thoracic adenopathy. The thoracic esophagus is unremarkable. Lungs/Pleura: Mild biapical pleuroparenchymal scarring, right greater than left. Mild apically predominant emphysema. Nodular scarring within the right lower lobe appears stable since prior examination and was metabolically negative on prior PET CT examination. As noted on prior examination, the right infrahilar mass seen on examination of 10/28/2019 is not discretely visualized. There is extensive bronchial wall thickening noted within the a right hilum and right lower lobe as well as extensive airway impaction within the right lower lobe and, to a lesser extent, the right middle lobe in keeping with acute infection and/or airway inflammation. There has developed marked central airway narrowing involving the right middle lobe, best noted on image # 82/3. Mild subpleural atelectasis or infiltrate has developed within the posterior segment of the right lower lobe, possibly representing post radiation change. No new focal pulmonary nodule identified. No pneumothorax or pleural effusion. Upper Abdomen: An ill-defined hypodense mass is developed within the left hepatic lobe measuring at least 3.8 x 2.7 x 3.6 cm, incompletely evaluated on this noncontrast examination and not fully included on this exam, but suspicious for interval development of a hepatic metastasis. An additional 15 mm nodule is noted slightly laterally. The adrenal glands are unremarkable. No acute abnormality. Musculoskeletal: No lytic or blastic bone lesion. No acute bone abnormality. IMPRESSION: Interval development of multiple ill-defined and poorly characterized intrahepatic masses suspicious for development of hepatic metastatic disease. Dedicated CT examination of the abdomen pelvis with  contrast is recommended for further evaluation. Progressive bronchial wall thickening, marked narrowing of the central airways of the right middle lobe, and development of subpleural atelectasis or infiltrate within the posterior right upper lobe all possibly reflecting changes of radiation therapy. Superimposed extensive airway impaction is noted, particularly within the right lower lobe, and can be seen in the setting of acute infection and/or airway inflammation. Stable post radiation changes within the right lower lobe. Mild emphysema. Extensive coronary artery calcification. Aortic Atherosclerosis (ICD10-I70.0) and Emphysema (ICD10-J43.9).  Electronically Signed   By: Fidela Salisbury MD   On: 07/23/2020 23:01     Assessment and plan- Patient is a 80 y.o. female with history of stage I lung cancer s/p SBRT here to discuss CT scan results and further management  Patient has received 2 rounds of SBRT so far.  In August 2020 she received SBRT to the right lower lobe lung lesion.  She was then found to have an endoluminal filling defect of 2.4 cm In September 2021 for which she received SBRT.  Her most recent CT scan done on 07/22/2020 shows areas of radiation changes in the right lower lobe the previously seen right infrahilar mass is not discretely visualized.  However she was found to have an ill-defined hypodense mass in the left hepatic lobe measuring 3.8 x 2.7 x 3.6 cm.  There was an additional 15 mm nodule as well.  These are concerning for hepatic metastatic disease.  I will plan to get a PET CT scan at this time followed by an ultrasound-guided liver biopsy.  I will see her back after these tests are back to discuss further management   Visit Diagnosis 1. Liver mass   2. History of lung cancer      Dr. Randa Evens, MD, MPH [facility] [phone] 07/26/2020 2:47 PM

## 2020-07-27 ENCOUNTER — Telehealth: Payer: Self-pay | Admitting: *Deleted

## 2020-07-27 NOTE — Telephone Encounter (Signed)
Pt made aware of PET scan scheduled for 5/26 at 830am. Instructed to arrive at 8am at the [facility] Instructions given to remain NPO after midnight, take meds with water. Informed that will be in touch with her results and discuss next steps for biopsy if needed. Pt verbalized understanding.

## 2020-07-28 ENCOUNTER — Other Ambulatory Visit: Payer: Self-pay

## 2020-07-28 ENCOUNTER — Encounter: Payer: Self-pay | Admitting: Nurse Practitioner

## 2020-07-28 ENCOUNTER — Ambulatory Visit: Payer: Medicare Other | Admitting: Nurse Practitioner

## 2020-07-28 ENCOUNTER — Encounter
Admission: RE | Admit: 2020-07-28 | Discharge: 2020-07-28 | Disposition: A | Discharge status: Home / Self Care | Payer: Medicare Other | Source: Ambulatory Visit | Attending: Oncology | Admitting: Oncology

## 2020-07-28 VITALS — BP 128/78 | HR 100 | Temp 97.4°F | Resp 16 | Ht 62.0 in | Wt 117.4 lb

## 2020-07-28 DIAGNOSIS — C787 Secondary malignant neoplasm of liver and intrahepatic bile duct: Secondary | ICD-10-CM

## 2020-07-28 DIAGNOSIS — Z79899 Other long term (current) drug therapy: Secondary | ICD-10-CM | POA: Diagnosis not present

## 2020-07-28 DIAGNOSIS — C3490 Malignant neoplasm of unspecified part of unspecified bronchus or lung: Secondary | ICD-10-CM | POA: Diagnosis not present

## 2020-07-28 LAB — GLUCOSE, CAPILLARY: Glucose-Capillary: 112 mg/dL — ABNORMAL HIGH (ref 70–99)

## 2020-07-28 MED ORDER — FLUDEOXYGLUCOSE F - 18 (FDG) INJECTION
6.1400 | Freq: Once | INTRAVENOUS | Status: AC | PRN
  Administered 2020-07-28: 6.14 via INTRAVENOUS

## 2020-07-28 NOTE — Progress Notes (Signed)
[facility] [place], [place] [address]  Internal MEDICINE  Office Visit Note  Patient Name: Latoya Lynch  [id number]  [id number]  Date of Service: 07/28/2020  Chief Complaint  Patient presents with  . Medicare Wellness    Pt did a pet scan this morning  . Depression  . Hyperlipidemia  . COPD  . Quality Metric Gaps    dexa    HPI  Pt left without being seen for visit today    Current Medication: Outpatient Encounter Medications as of 07/28/2020  Medication Sig  . buPROPion (WELLBUTRIN SR) 150 MG 12 hr tablet Take 150 mg by mouth 2 (two) times daily.   . cetirizine (ZYRTEC) 10 MG tablet Take 10 mg by mouth daily.  Marland Kitchen EPINEPHrine 0.3 mg/0.3 mL IJ SOAJ injection Inject 0.3 mg into the muscle as needed for anaphylaxis.  Marland Kitchen ipratropium-albuterol (DUONEB) 0.5-2.5 (3) MG/3ML SOLN USE 1 VIAL  IN  NEBULIZER TWICE  DAILY  . meloxicam (MOBIC) 15 MG tablet Take 15 mg by mouth 3 (three) times daily.   . methocarbamol (ROBAXIN) 750 MG tablet Take 750 mg by mouth daily.  . naproxen sodium (ALEVE) 220 MG tablet Take 440 mg by mouth every morning.   . Omega-3 1000 MG CAPS Take 1,000 mg by mouth daily.   . rosuvastatin (CRESTOR) 5 MG tablet Take 1 tablet (5 mg total) by mouth daily.  . riboflavin (VITAMIN B-2) 100 MG TABS tablet Take 200 mg by mouth in the morning and at bedtime.  (Patient not taking: Reported on 07/28/2020)  . vitamin B-12 (CYANOCOBALAMIN) 500 MCG tablet Take 500 mcg by mouth daily. (Patient not taking: Reported on 07/28/2020)   No facility-administered encounter medications on file as of 07/28/2020.    Surgical History: Past Surgical History:  Procedure Laterality Date  . CATARACT EXTRACTION W/ INTRAOCULAR LENS  IMPLANT, BILATERAL    . 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 ELECTROMAGNETIC NAVIGATION BROCHOSCOPY Right 07/30/2018   Procedure: ELECTROMAGNETIC NAVIGATION BRONCHOSCOPY RIGHT;  Surgeon: Tyler Pita, MD;  Location: [facility]  ORS;  Service: Cardiopulmonary;  Laterality: Right;  . ENDOBRONCHIAL ULTRASOUND Right 07/30/2018   Procedure: ENDOBRONCHIAL ULTRASOUND RIGHT;  Surgeon: Tyler Pita, MD;  Location: [facility] ORS;  Service: Cardiopulmonary;  Laterality: Right;  . retinal detatchment Right 05/2017  . TONSILLECTOMY    . TOTAL HIP ARTHROPLASTY Right 01/2015  . TOTAL HIP ARTHROPLASTY Left 05/2019   in [place]  . TUBAL LIGATION      Medical History: Past Medical History:  Diagnosis Date  . Arthritis   . COPD (chronic obstructive pulmonary disease) ([place])   . Depression   . History of methicillin resistant staphylococcus aureus (MRSA)   . Hyperlipidemia   . Lung mass    right lung  . Osteoarthritis     Family History: Family History  Problem Relation Age of Onset  . Heart attack Mother   . Cancer Maternal Grandmother 80       spine ca  . Heart attack Maternal Grandfather     Social History   Socioeconomic History  . Marital status: Widowed    Spouse name: Not on file  . Number of children: Not on file  . Years of education: Not on file  . Highest education level: Not on file  Occupational History  . Not on file  Tobacco Use  . Smoking status: Current Every Day Smoker    Packs/day: 1.00    Years: 60.00    Pack years:  60.00    Types: Cigarettes  . Smokeless tobacco: Never Used  . Tobacco comment: about 5-10 cigarettes a day-11/18/2019  Vaping Use  . Vaping Use: Never used  Substance and Sexual Activity  . Alcohol use: Not Currently    Comment: once in a great while  . Drug use: Never  . Sexual activity: Not Currently  Other Topics Concern  . Not on file  Social History Narrative  . Not on file   Social Determinants of Health   Financial Resource Strain: Not on file  Food Insecurity: Not on file  Transportation Needs: Not on file  Physical Activity: Not on file  Stress: Not on file  Social Connections: Not on file  Intimate Partner Violence: Not on file      Review  of Systems  Vital Signs: BP 128/78   Pulse 100   Temp (!) 97.4 F (36.3 C)   Resp 16   Ht 5\' 2"  (1.575 m)   Wt 117 lb 6.4 oz (53.3 kg)   SpO2 94%   BMI 21.47 kg/m    Physical Exam     Assessment/Plan:   General Counseling: Latoya Lynch verbalizes understanding of the findings of todays visit and agrees with plan of treatment. I have discussed any further diagnostic evaluation that may be needed or ordered today. We also reviewed her medications today. she has been encouraged to call the office with any questions or concerns that should arise related to todays visit.    No orders of the defined types were placed in this encounter.   No orders of the defined types were placed in this encounter.   Total time spent:000 Minutes Time spent includes review of chart, medications, test results, and follow up plan with the patient.   [place] Controlled Substance Database was reviewed by me.   Dr Lavera Guise Internal medicine

## 2020-08-02 ENCOUNTER — Other Ambulatory Visit: Payer: Self-pay | Admitting: *Deleted

## 2020-08-02 NOTE — Telephone Encounter (Signed)
Tussionex if ok with me. Does he need a chest X-ray?  Faythe Casa, NP 08/02/2020 2:54 PM

## 2020-08-02 NOTE — Telephone Encounter (Signed)
Pt had recent PET scan last week but if you think she needs an additional chest xray just let me know. Pt does not report any fever. Only persistent cough that has been ongoing for several weeks.   I pended the prescription for tussionex if you could sign when you get a chance.

## 2020-08-02 NOTE — Telephone Encounter (Signed)
Pt reports persistent cough during the day and at night. Has tried OTC cough medicine without relief. Pt asking if can get something sent in for her to help relieve her cough.   Please advise.

## 2020-08-03 ENCOUNTER — Other Ambulatory Visit: Payer: Self-pay | Admitting: *Deleted

## 2020-08-03 MED ORDER — HYDROCOD POLST-CPM POLST ER 10-8 MG/5ML PO SUER: 5.0000 mL | Freq: Two times a day (BID) | ORAL | 0 refills | Status: DC | PRN

## 2020-08-04 NOTE — Progress Notes (Signed)
Patient on schedule for Liver Biopsy 08/08/2020, called and spoke with patient on phone with pre procedure instructions given. Made aware to be here @ 0730, NPO after MN prior to procedure and driver post procedure/recovery/discharge, stated understanding.

## 2020-08-05 ENCOUNTER — Ambulatory Visit: Admission: RE | Admit: 2020-08-05 | Payer: Medicare Other | Source: Ambulatory Visit

## 2020-08-05 ENCOUNTER — Other Ambulatory Visit: Payer: Self-pay | Admitting: Student

## 2020-08-08 ENCOUNTER — Other Ambulatory Visit: Payer: Self-pay

## 2020-08-08 ENCOUNTER — Ambulatory Visit
Admission: RE | Admit: 2020-08-08 | Discharge: 2020-08-08 | Disposition: A | Discharge status: Home / Self Care | Payer: Medicare Other | Source: Ambulatory Visit | Attending: Oncology | Admitting: Oncology

## 2020-08-08 DIAGNOSIS — J449 Chronic obstructive pulmonary disease, unspecified: Secondary | ICD-10-CM | POA: Diagnosis not present

## 2020-08-08 DIAGNOSIS — R160 Hepatomegaly, not elsewhere classified: Secondary | ICD-10-CM | POA: Diagnosis not present

## 2020-08-08 DIAGNOSIS — C3491 Malignant neoplasm of unspecified part of right bronchus or lung: Secondary | ICD-10-CM | POA: Diagnosis not present

## 2020-08-08 DIAGNOSIS — C227 Other specified carcinomas of liver: Secondary | ICD-10-CM | POA: Insufficient documentation

## 2020-08-08 DIAGNOSIS — C3490 Malignant neoplasm of unspecified part of unspecified bronchus or lung: Secondary | ICD-10-CM | POA: Diagnosis not present

## 2020-08-08 DIAGNOSIS — C787 Secondary malignant neoplasm of liver and intrahepatic bile duct: Secondary | ICD-10-CM | POA: Diagnosis not present

## 2020-08-08 DIAGNOSIS — Z79899 Other long term (current) drug therapy: Secondary | ICD-10-CM | POA: Insufficient documentation

## 2020-08-08 DIAGNOSIS — F17210 Nicotine dependence, cigarettes, uncomplicated: Secondary | ICD-10-CM | POA: Insufficient documentation

## 2020-08-08 DIAGNOSIS — E785 Hyperlipidemia, unspecified: Secondary | ICD-10-CM | POA: Insufficient documentation

## 2020-08-08 LAB — CBC
HCT: 35.5 % — ABNORMAL LOW (ref 36.0–46.0)
Hemoglobin: 12.1 g/dL (ref 12.0–15.0)
MCH: 31.3 pg (ref 26.0–34.0)
MCHC: 34.1 g/dL (ref 30.0–36.0)
MCV: 91.7 fL (ref 80.0–100.0)
Platelets: 326 10*3/uL (ref 150–400)
RBC: 3.87 MIL/uL (ref 3.87–5.11)
RDW: 12.6 % (ref 11.5–15.5)
WBC: 8.5 10*3/uL (ref 4.0–10.5)
nRBC: 0 % (ref 0.0–0.2)

## 2020-08-08 LAB — PROTIME-INR
INR: 1 (ref 0.8–1.2)
Prothrombin Time: 13.5 seconds (ref 11.4–15.2)

## 2020-08-08 MED ORDER — FENTANYL CITRATE (PF) 100 MCG/2ML IJ SOLN
INTRAMUSCULAR | Status: AC
  Filled 2020-08-08: qty 2

## 2020-08-08 MED ORDER — MIDAZOLAM HCL 2 MG/2ML IJ SOLN
INTRAMUSCULAR | Status: AC
  Filled 2020-08-08: qty 2

## 2020-08-08 MED ORDER — MIDAZOLAM HCL 2 MG/2ML IJ SOLN
INTRAMUSCULAR | Status: AC | PRN
  Administered 2020-08-08 (×2): 1 mg via INTRAVENOUS

## 2020-08-08 MED ORDER — SODIUM CHLORIDE 0.9 % IV SOLN: INTRAVENOUS | Status: DC

## 2020-08-08 MED ORDER — FENTANYL CITRATE (PF) 100 MCG/2ML IJ SOLN
INTRAMUSCULAR | Status: AC | PRN
  Administered 2020-08-08 (×2): 50 ug via INTRAVENOUS

## 2020-08-08 NOTE — H&P (Signed)
Chief Complaint: Hypermetabolic liver mass. Request is for liver biopsy   Referring Physician(s): Rao,Archana C  Supervising Physician: Mir, Sharen Heck  Patient Status: [facility] - Out-pt  History of Present Illness: Latoya Lynch is a 80 y.o. female History of HLD, COPD, RLL mung mass suspicious for malignancy s/p SBRT and empiric radiation. Found to have a hypermetabolic liver mass on subsequent treatment planning PET from 5.25.22. PET scan reads There are multiple FDG avid metastases identified within the lateral segment of left hepatic lobe measuring up to 3.9 cm.  Team is requesting a  liver biopsy for treatment planning purposes.   Currently endorses hip she states is related radio embolization. Patient alert and laying in bed, calm and comfortable. Daughter at bedside. Denies any fevers, headache, chest pain, SOB, cough, abdominal pain, nausea, vomiting or bleeding. Return precautions and treatment recommendations and follow-up discussed with the patient who is agreeable with the plan.   Past Medical History:  Diagnosis Date  . Arthritis   . COPD (chronic obstructive pulmonary disease) ([place])   . Depression   . History of methicillin resistant staphylococcus aureus (MRSA)   . Hyperlipidemia   . Lung mass    right lung  . Osteoarthritis     Past Surgical History:  Procedure Laterality Date  . CATARACT EXTRACTION W/ INTRAOCULAR LENS  IMPLANT, BILATERAL    . 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 ELECTROMAGNETIC NAVIGATION BROCHOSCOPY Right 07/30/2018   Procedure: ELECTROMAGNETIC NAVIGATION BRONCHOSCOPY RIGHT;  Surgeon: Tyler Pita, MD;  Location: [facility] ORS;  Service: Cardiopulmonary;  Laterality: Right;  . ENDOBRONCHIAL ULTRASOUND Right 07/30/2018   Procedure: ENDOBRONCHIAL ULTRASOUND RIGHT;  Surgeon: Tyler Pita, MD;  Location: [facility] ORS;  Service: Cardiopulmonary;  Laterality: Right;  . retinal detatchment Right 05/2017  . TONSILLECTOMY    . TOTAL HIP  ARTHROPLASTY Right 01/2015  . TOTAL HIP ARTHROPLASTY Left 05/2019   in [place]  . TUBAL LIGATION      Allergies: Other, Peanut oil, and Prochlorperazine  Medications: Prior to Admission medications   Medication Sig Start Date End Date Taking? Authorizing Provider  buPROPion (WELLBUTRIN SR) 150 MG 12 hr tablet Take 150 mg by mouth 2 (two) times daily.    Yes [provider]  cetirizine (ZYRTEC) 10 MG tablet Take 10 mg by mouth daily.   Yes [provider]  chlorpheniramine-HYDROcodone (TUSSIONEX) 10-8 MG/5ML SUER Take 5 mLs by mouth every 12 (twelve) hours as needed for cough. 08/03/20  Yes Jacquelin Hawking, NP  ipratropium-albuterol (DUONEB) 0.5-2.5 (3) MG/3ML SOLN USE 1 VIAL  IN  NEBULIZER TWICE  DAILY 01/18/20  Yes Lavera Guise, MD  meloxicam (MOBIC) 15 MG tablet Take 15 mg by mouth 3 (three) times daily.  03/25/19  Yes [provider]  methocarbamol (ROBAXIN) 750 MG tablet Take 750 mg by mouth daily.   Yes [provider]  naproxen sodium (ALEVE) 220 MG tablet Take 440 mg by mouth every morning.    Yes [provider]  Omega-3 1000 MG CAPS Take 1,000 mg by mouth daily.    Yes [provider]  rosuvastatin (CRESTOR) 5 MG tablet Take 1 tablet (5 mg total) by mouth daily. 02/23/20  Yes Luiz Ochoa, NP  vitamin B-12 (CYANOCOBALAMIN) 500 MCG tablet Take 500 mcg by mouth daily.   Yes [provider]  EPINEPHrine 0.3 mg/0.3 mL IJ SOAJ injection Inject 0.3 mg into the muscle as needed for anaphylaxis.    [provider]  riboflavin (VITAMIN B-2)  100 MG TABS tablet Take 200 mg by mouth in the morning and at bedtime.  Patient not taking: No sig reported    [provider]     Family History  Problem Relation Age of Onset  . Heart attack Mother   . Cancer Maternal Grandmother 80       spine ca  . Heart attack Maternal Grandfather     Social History   Socioeconomic History  . Marital status: Widowed     Spouse name: Not on file  . Number of children: 3  . Years of education: Not on file  . Highest education level: Not on file  Occupational History  . Not on file  Tobacco Use  . Smoking status: Current Every Day Smoker    Packs/day: 0.50    Years: 60.00    Pack years: 30.00    Types: Cigarettes  . Smokeless tobacco: Never Used  . Tobacco comment: about 5-10 cigarettes a day-11/18/2019  Vaping Use  . Vaping Use: Never used  Substance and Sexual Activity  . Alcohol use: Not Currently    Comment: once in a great while  . Drug use: Never  . Sexual activity: Not Currently  Other Topics Concern  . Not on file  Social History Narrative   Lives by herself with a Risk analyst    Social Determinants of Health   Financial Resource Strain: Not on file  Food Insecurity: Not on file  Transportation Needs: Not on file  Physical Activity: Not on file  Stress: Not on file  Social Connections: Not on file     Review of Systems: A 12 point ROS discussed and pertinent positives are indicated in the HPI above.  All other systems are negative.  Review of Systems  Constitutional: Negative for fatigue and fever.  HENT: Negative for congestion.   Respiratory: Negative for cough and shortness of breath.   Gastrointestinal: Negative for abdominal pain, diarrhea, nausea and vomiting.  Musculoskeletal:       Pain to the back of the pelvis.     Vital Signs: There were no vitals taken for this visit.  Physical Exam Vitals and nursing note reviewed.  Constitutional:      Appearance: She is well-developed.  HENT:     Head: Normocephalic and atraumatic.  Eyes:     Conjunctiva/sclera: Conjunctivae normal.  Cardiovascular:     Rate and Rhythm: Normal rate and regular rhythm.     Heart sounds: Normal heart sounds.  Pulmonary:     Effort: Pulmonary effort is normal.     Breath sounds: Normal breath sounds.  Musculoskeletal:        General: Normal range of motion.     Cervical back:  Normal range of motion.  Skin:    General: Skin is warm.  Neurological:     Mental Status: She is alert and oriented to person, place, and time.     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 Intermittent productive cough x1 year. Current smoker with 60 pack-year smoking history. EXAM: CT CHEST WITHOUT CONTRAST TECHNIQUE: Multidetector CT imaging of the chest was performed following the standard protocol without IV contrast. COMPARISON:  03/01/2020, 10/28/2019, PET CT 11/11/2019 FINDINGS: Cardiovascular: Extensive multi-vessel coronary artery calcification. Global cardiac size is within normal limits. No pericardial effusion. The central pulmonary arteries are of normal caliber. The thoracic  aorta is of normal caliber. Moderate atherosclerotic calcification is seen throughout the thoracic aorta. Mediastinum/Nodes: Stable 6 mm nodule within the left thyroid lobe. No pathologic thoracic adenopathy. The thoracic esophagus is unremarkable. Lungs/Pleura: Mild biapical pleuroparenchymal scarring, right greater than left. Mild apically predominant emphysema. Nodular scarring within the right lower lobe appears stable since prior examination and was metabolically negative on prior PET CT examination. As noted on prior examination, the right infrahilar mass seen on examination of 10/28/2019 is not discretely visualized. There is extensive bronchial wall thickening noted within the a right hilum and right lower lobe as well as extensive airway impaction within the right lower lobe and, to a lesser extent, the right middle lobe in keeping with acute infection and/or airway inflammation. There has developed marked central airway narrowing involving the right middle lobe, best noted on image # 82/3. Mild subpleural atelectasis or infiltrate has developed within the  posterior segment of the right lower lobe, possibly representing post radiation change. No new focal pulmonary nodule identified. No pneumothorax or pleural effusion. Upper Abdomen: An ill-defined hypodense mass is developed within the left hepatic lobe measuring at least 3.8 x 2.7 x 3.6 cm, incompletely evaluated on this noncontrast examination and not fully included on this exam, but suspicious for interval development of a hepatic metastasis. An additional 15 mm nodule is noted slightly laterally. The adrenal glands are unremarkable. No acute abnormality. Musculoskeletal: No lytic or blastic bone lesion. No acute bone abnormality. IMPRESSION: Interval development of multiple ill-defined and poorly characterized intrahepatic masses suspicious for development of hepatic metastatic disease. Dedicated CT examination of the abdomen pelvis with contrast is recommended for further evaluation. Progressive bronchial wall thickening, marked narrowing of the central airways of the right middle lobe, and development of subpleural atelectasis or infiltrate within the posterior right upper lobe all possibly reflecting changes of radiation therapy. Superimposed extensive airway impaction is noted, particularly within the right lower lobe, and can be seen in the setting of acute infection and/or airway inflammation. Stable post radiation changes within the right lower lobe. Mild emphysema. Extensive coronary artery calcification. Aortic Atherosclerosis (ICD10-I70.0) and Emphysema (ICD10-J43.9). Electronically Signed   By: Fidela Salisbury MD   On: 07/23/2020 23:01   NM PET Image Restag (PS) Skull Base To Thigh  Result Date: 07/29/2020 CLINICAL DATA:  Subsequent treatment strategy for lung cancer. EXAM: NUCLEAR MEDICINE PET SKULL BASE TO THIGH TECHNIQUE: 6.14 mCi F-18 FDG was injected intravenously. Full-ring PET imaging was performed from the skull base to thigh after the radiotracer. CT data was obtained and used for  attenuation correction and anatomic localization. Fasting blood glucose: 112 mg/dl COMPARISON:  PET-CT 11/11/2019 FINDINGS: Mediastinal blood pool activity: SUV max 2.16 Liver activity: SUV max NA NECK: No hypermetabolic lymph nodes in the neck. Incidental CT findings: Postsurgical changes identified involving the right temporal bone. CHEST: No FDG avid supraclavicular, axillary, mediastinal, or hilar lymph nodes. Post treatment changes identified within the periphery of the right lower lobe. No signs of recurrent FDG avid tumor. Incidental CT findings: Centrilobular and paraseptal emphysema. Aortic atherosclerosis. Coronary artery calcifications. ABDOMEN/PELVIS: There are at least 3 distinct FDG avid tumors within the lateral segment of left hepatic lobe. The dominant lesion measures 3.9 cm with an SUV max of 17.83. No abnormal FDG uptake identified within the pancreas, spleen, or adrenal glands. No FDG avid abdominopelvic lymph nodes. Incidental CT findings: Aortic atherosclerosis. SKELETON: No focal hypermetabolic activity to suggest skeletal metastasis. Incidental CT findings: Status post bilateral hip arthroplasty IMPRESSION:  1. There are multiple FDG avid metastases identified within the lateral segment of left hepatic lobe measuring up to 3.9 cm. 2. Post treatment changes within the right lower lobe without signs of residual/recurrent FDG avid tumor. 3. Aortic Atherosclerosis (ICD10-I70.0) and Emphysema (ICD10-J43.9). Electronically Signed   By: Kerby Moors M.D.   On: 07/29/2020 11:16    Labs:  CBC: Recent Labs    10/28/19 1146  WBC 7.7  HGB 13.9  HCT 39.7  PLT 220    COAGS: No results for input(s): INR, APTT in the last 8760 hours.  BMP: Recent Labs    10/28/19 1146  NA 141  K 3.7  CL 107  CO2 25  GLUCOSE 103*  BUN 19  CALCIUM 8.9  CREATININE 0.94  GFRNONAA 58*  GFRAA >60    LIVER FUNCTION TESTS: Recent Labs    10/28/19 1146  BILITOT 0.9  AST 26  ALT 20  ALKPHOS 75   PROT 6.7  ALBUMIN 3.9    Assessment and Plan:  80 y.o. female outpatient. Smoker. History of HLD, COPD, RLL lung mass cytology benign on 11.11.19 IR biopsy & suspicious for malignancy from 5.27.22 bronchoscopy) s/p SBRT and empiric radiation. Found to have a hypermetabolic liver masses on subsequent treatment planning PET from 5.25.22. PET scan reads There are multiple FDG avid metastases identified within the lateral segment of left hepatic lobe measuring up to 3.9 cm.  Team is requesting a  liver biopsy for treatment planning purposes  All labs and medications are within acceptable parameters. No pertinent allergies. Patient has been NPO since midnight.   Risks and benefits of liver biopsy was discussed with the patient and/or patient's family including, but not limited to bleeding, infection, damage to adjacent structures or low yield requiring additional tests.  All of the questions were answered and there is agreement to proceed.  Consent signed and in chart.   Thank you for this interesting consult.  I greatly enjoyed meeting Marianita Botkin and look forward to participating in their care.  A copy of this report was sent to the requesting provider on this date.  Electronically Signed: Jacqualine Mau, NP 08/08/2020, 8:10 AM   I spent a total of  40 Minutes   in face to face in clinical consultation, greater than 50% of which was counseling/coordinating care for liver biopsy

## 2020-08-08 NOTE — Procedures (Signed)
Interventional Radiology Procedure Note  Procedure: Liver lesion biopsy  Indication: Liver lesions  Findings: Please refer to procedural dictation for full description.  Complications: None  EBL: < 10 mL  Miachel Roux, MD [phone]

## 2020-08-08 NOTE — Progress Notes (Signed)
Patient clinically stable post Liver biopsy per Dr Dwaine Gale, tolerated well. Denies complaints at this time. Received Versed 2 mg along with Fentanyl 100 mcg IV for procedure. bandade dry/intact. Vitals stable pre and post procedure. Report given to Fransico Michael RN post procedure.

## 2020-08-10 ENCOUNTER — Other Ambulatory Visit: Payer: Self-pay | Admitting: Pathology

## 2020-08-10 DIAGNOSIS — G894 Chronic pain syndrome: Secondary | ICD-10-CM | POA: Diagnosis not present

## 2020-08-10 DIAGNOSIS — M47896 Other spondylosis, lumbar region: Secondary | ICD-10-CM | POA: Diagnosis not present

## 2020-08-10 DIAGNOSIS — M5416 Radiculopathy, lumbar region: Secondary | ICD-10-CM | POA: Diagnosis not present

## 2020-08-10 LAB — SURGICAL PATHOLOGY

## 2020-08-17 ENCOUNTER — Other Ambulatory Visit: Payer: Self-pay | Admitting: *Deleted

## 2020-08-17 DIAGNOSIS — J449 Chronic obstructive pulmonary disease, unspecified: Secondary | ICD-10-CM | POA: Diagnosis not present

## 2020-08-17 MED ORDER — HYDROCOD POLST-CPM POLST ER 10-8 MG/5ML PO SUER: 5.0000 mL | Freq: Two times a day (BID) | ORAL | 0 refills | Status: DC | PRN

## 2020-08-18 ENCOUNTER — Other Ambulatory Visit: Payer: Medicare Other

## 2020-08-19 ENCOUNTER — Encounter: Payer: Self-pay | Admitting: Oncology

## 2020-08-19 ENCOUNTER — Inpatient Hospital Stay: Payer: Medicare Other | Attending: Oncology | Admitting: Oncology

## 2020-08-19 ENCOUNTER — Inpatient Hospital Stay (HOSPITAL_BASED_OUTPATIENT_CLINIC_OR_DEPARTMENT_OTHER): Payer: Medicare Other | Admitting: Hospice and Palliative Medicine

## 2020-08-19 ENCOUNTER — Other Ambulatory Visit: Payer: Self-pay

## 2020-08-19 VITALS — BP 118/70 | HR 101 | Temp 98.4°F | Resp 17 | Ht 62.0 in | Wt 114.6 lb

## 2020-08-19 DIAGNOSIS — E785 Hyperlipidemia, unspecified: Secondary | ICD-10-CM | POA: Diagnosis not present

## 2020-08-19 DIAGNOSIS — C787 Secondary malignant neoplasm of liver and intrahepatic bile duct: Secondary | ICD-10-CM | POA: Insufficient documentation

## 2020-08-19 DIAGNOSIS — Z79899 Other long term (current) drug therapy: Secondary | ICD-10-CM | POA: Diagnosis not present

## 2020-08-19 DIAGNOSIS — F17210 Nicotine dependence, cigarettes, uncomplicated: Secondary | ICD-10-CM | POA: Insufficient documentation

## 2020-08-19 DIAGNOSIS — R160 Hepatomegaly, not elsewhere classified: Secondary | ICD-10-CM

## 2020-08-19 DIAGNOSIS — E041 Nontoxic single thyroid nodule: Secondary | ICD-10-CM | POA: Diagnosis not present

## 2020-08-19 DIAGNOSIS — Z515 Encounter for palliative care: Secondary | ICD-10-CM | POA: Diagnosis not present

## 2020-08-19 DIAGNOSIS — Z923 Personal history of irradiation: Secondary | ICD-10-CM | POA: Diagnosis not present

## 2020-08-19 DIAGNOSIS — Z7189 Other specified counseling: Secondary | ICD-10-CM | POA: Insufficient documentation

## 2020-08-19 DIAGNOSIS — C3491 Malignant neoplasm of unspecified part of right bronchus or lung: Secondary | ICD-10-CM | POA: Insufficient documentation

## 2020-08-19 DIAGNOSIS — C3431 Malignant neoplasm of lower lobe, right bronchus or lung: Secondary | ICD-10-CM | POA: Insufficient documentation

## 2020-08-19 NOTE — Progress Notes (Signed)
Tumor Board Documentation  Latoya Lynch was presented by Dr Janese Banks at our Tumor Board on 08/18/2020, which included representatives from medical oncology, radiation oncology, internal medicine, navigation, pathology, radiology, surgical, pharmacy, research, palliative care, pulmonology.  Latoya Lynch currently presents as a current patient, for Latoya Lynch, for new positive pathology with history of the following treatments: active survellience, surgical intervention(s).  Additionally, we reviewed previous medical and familial history, history of present illness, and recent lab results along with all available histopathologic and imaging studies. The tumor board considered available treatment options and made the following recommendations: Additional screening (Omniseq, PDL1 Molecular testing) Targeted therapy vs Chemotherapy  The following procedures/referrals were also placed: No orders of the defined types were placed in this encounter.   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  Tumor board is a meeting of clinicians from various specialty areas who evaluate and discuss patients for whom a multidisciplinary approach is being considered. Final determinations in the plan of care are those of the provider(s). The responsibility for follow up of recommendations given during tumor board is that of the provider.   Today's extended care, comprehensive team conference, Latoya Lynch was not present for the discussion and was not examined.   Multidisciplinary Tumor Board is a multidisciplinary case peer review process.  Decisions discussed in the Multidisciplinary Tumor Board reflect the opinions of the specialists present at the conference without having examined the patient.  Ultimately,  treatment and diagnostic decisions rest with the primary provider(s) and the patient.

## 2020-08-19 NOTE — Progress Notes (Signed)
[place]  [phone] [phone]   Name: Latoya Lynch Date: 08/19/2020 MRN: [id number]  DOB: 10/25/1940  Patient Care Team: Lavera Guise, MD as PCP - General (Internal Medicine) Telford Nab, RN as Registered Nurse    REASON FOR CONSULTATION: Latoya Lynch is a 80 y.o. 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  Patient has had falls and word searching.  She was referred to palliative care to help address goals and manage ongoing symptoms.  SOCIAL HISTORY:     reports that she has been smoking cigarettes. She has a 30.00 pack-year smoking history. She has never used smokeless tobacco. She reports previous alcohol use. She reports that she does not use drugs.  Patient is unmarried lives at home alone.  She has a daughter who lives nearby and a son in [place].  Patient retired as a Pharmacist, hospital in [place].  ADVANCE DIRECTIVES:  Not on file  CODE STATUS:   PAST MEDICAL HISTORY: Past Medical History:  Diagnosis Date   Arthritis    COPD (chronic obstructive pulmonary disease) ([place])    Depression    History of methicillin resistant staphylococcus aureus (MRSA)    Hyperlipidemia    Lung mass    right lung   Osteoarthritis     PAST SURGICAL HISTORY:  Past Surgical History:  Procedure Laterality Date   CATARACT EXTRACTION W/ INTRAOCULAR LENS  IMPLANT, BILATERAL     CEREBRAL ANEURYSM REPAIR  01/2003   ELECTROMAGNETIC NAVIGATION BROCHOSCOPY Right 07/30/2018   Procedure: ELECTROMAGNETIC NAVIGATION BRONCHOSCOPY RIGHT;  Surgeon: Tyler Pita, MD;  Location: [facility] ORS;  Service: Cardiopulmonary;  Laterality: Right;   ENDOBRONCHIAL ULTRASOUND Right 07/30/2018   Procedure: ENDOBRONCHIAL ULTRASOUND RIGHT;  Surgeon: Tyler Pita, MD;  Location: [facility] ORS;  Service: Cardiopulmonary;  Laterality: Right;   retinal  detatchment Right 05/2017   TONSILLECTOMY     TOTAL HIP ARTHROPLASTY Right 01/2015   TOTAL HIP ARTHROPLASTY Left 05/2019   in [place]      HEMATOLOGY/ONCOLOGY HISTORY:  Oncology History   No history exists.    ALLERGIES:  is allergic to other, peanut oil, and prochlorperazine.  MEDICATIONS:  Current Outpatient Medications  Medication Sig Dispense Refill   buPROPion (WELLBUTRIN SR) 150 MG 12 hr tablet Take 150 mg by mouth 2 (two) times daily.      cetirizine (ZYRTEC) 10 MG tablet Take 10 mg by mouth daily.     chlorpheniramine-HYDROcodone (TUSSIONEX) 10-8 MG/5ML SUER Take 5 mLs by mouth every 12 (twelve) hours as needed for cough. 140 mL 0   EPINEPHrine 0.3 mg/0.3 mL IJ SOAJ injection Inject 0.3 mg into the muscle as needed for anaphylaxis.     ipratropium-albuterol (DUONEB) 0.5-2.5 (3) MG/3ML SOLN USE 1 VIAL  IN  NEBULIZER TWICE  DAILY 180 mL 11   meloxicam (MOBIC) 15 MG tablet Take 15 mg by mouth 3 (three) times daily.  (Patient not taking: Reported on 08/19/2020)     methocarbamol (ROBAXIN) 750 MG tablet Take 750 mg by mouth daily. (Patient not taking: Reported on 08/19/2020)     naproxen sodium (ALEVE) 220 MG tablet Take 440 mg by mouth every morning.      Omega-3 1000 MG CAPS Take 1,000 mg by mouth daily.      riboflavin (VITAMIN B-2) 100 MG TABS tablet Take 200 mg by mouth in the morning and at bedtime.  (Patient  not taking: No sig reported)     rosuvastatin (CRESTOR) 5 MG tablet Take 1 tablet (5 mg total) by mouth daily. 90 tablet 1   vitamin B-12 (CYANOCOBALAMIN) 500 MCG tablet Take 500 mcg by mouth daily.     No current facility-administered medications for this visit.    VITAL SIGNS: There were no vitals taken for this visit. There were no vitals filed for this visit.  Estimated body mass index is 20.96 kg/m as calculated from the following:   Height as of an earlier encounter on 08/19/20: 5' 2"  (1.575 m).   Weight as of an earlier encounter on  08/19/20: 114 lb 9.6 oz (52 kg).  LABS: CBC:    Component Value Date/Time   WBC 8.5 08/08/2020 0809   HGB 12.1 08/08/2020 0809   HCT 35.5 (L) 08/08/2020 0809   PLT 326 08/08/2020 0809   MCV 91.7 08/08/2020 0809   NEUTROABS 4.4 10/28/2019 1146   LYMPHSABS 2.4 10/28/2019 1146   MONOABS 0.6 10/28/2019 1146   EOSABS 0.2 10/28/2019 1146   BASOSABS 0.1 10/28/2019 1146   Comprehensive Metabolic Panel:    Component Value Date/Time   NA 141 10/28/2019 1146   K 3.7 10/28/2019 1146   CL 107 10/28/2019 1146   CO2 25 10/28/2019 1146   BUN 19 10/28/2019 1146   CREATININE 0.94 10/28/2019 1146   GLUCOSE 103 (H) 10/28/2019 1146   CALCIUM 8.9 10/28/2019 1146   AST 26 10/28/2019 1146   ALT 20 10/28/2019 1146   ALKPHOS 75 10/28/2019 1146   BILITOT 0.9 10/28/2019 1146   PROT 6.7 10/28/2019 1146   ALBUMIN 3.9 10/28/2019 1146    RADIOGRAPHIC STUDIES: CT Chest Wo Contrast  Result Date: 07/23/2020 CLINICAL DATA:  Restaging non-small cell lung cancer of the right lower lobe initially diagnosed in 11/2017 status post radiation therapy. Intermittent productive cough x1 year. Current smoker with 60 pack-year smoking history. EXAM: CT CHEST WITHOUT CONTRAST TECHNIQUE: Multidetector CT imaging of the chest was performed following the standard protocol without IV contrast. COMPARISON:  03/01/2020, 10/28/2019, PET CT 11/11/2019 FINDINGS: Cardiovascular: Extensive multi-vessel coronary artery calcification. Global cardiac size is within normal limits. No pericardial effusion. The central pulmonary arteries are of normal caliber. The thoracic aorta is of normal caliber. Moderate atherosclerotic calcification is seen throughout the thoracic aorta. Mediastinum/Nodes: Stable 6 mm nodule within the left thyroid lobe. No pathologic thoracic adenopathy. The thoracic esophagus is unremarkable. Lungs/Pleura: Mild biapical pleuroparenchymal scarring, right greater than left. Mild apically predominant emphysema. Nodular  scarring within the right lower lobe appears stable since prior examination and was metabolically negative on prior PET CT examination. As noted on prior examination, the right infrahilar mass seen on examination of 10/28/2019 is not discretely visualized. There is extensive bronchial wall thickening noted within the a right hilum and right lower lobe as well as extensive airway impaction within the right lower lobe and, to a lesser extent, the right middle lobe in keeping with acute infection and/or airway inflammation. There has developed marked central airway narrowing involving the right middle lobe, best noted on image # 82/3. Mild subpleural atelectasis or infiltrate has developed within the posterior segment of the right lower lobe, possibly representing post radiation change. No new focal pulmonary nodule identified. No pneumothorax or pleural effusion. Upper Abdomen: An ill-defined hypodense mass is developed within the left hepatic lobe measuring at least 3.8 x 2.7 x 3.6 cm, incompletely evaluated on this noncontrast examination and not fully included on this exam, but suspicious  for interval development of a hepatic metastasis. An additional 15 mm nodule is noted slightly laterally. The adrenal glands are unremarkable. No acute abnormality. Musculoskeletal: No lytic or blastic bone lesion. No acute bone abnormality. IMPRESSION: Interval development of multiple ill-defined and poorly characterized intrahepatic masses suspicious for development of hepatic metastatic disease. Dedicated CT examination of the abdomen pelvis with contrast is recommended for further evaluation. Progressive bronchial wall thickening, marked narrowing of the central airways of the right middle lobe, and development of subpleural atelectasis or infiltrate within the posterior right upper lobe all possibly reflecting changes of radiation therapy. Superimposed extensive airway impaction is noted, particularly within the right lower  lobe, and can be seen in the setting of acute infection and/or airway inflammation. Stable post radiation changes within the right lower lobe. Mild emphysema. Extensive coronary artery calcification. Aortic Atherosclerosis (ICD10-I70.0) and Emphysema (ICD10-J43.9). Electronically Signed   By: Fidela Salisbury MD   On: 07/23/2020 23:01   NM PET Image Restag (PS) Skull Base To Thigh  Result Date: 07/29/2020 CLINICAL DATA:  Subsequent treatment strategy for lung cancer. EXAM: NUCLEAR MEDICINE PET SKULL BASE TO THIGH TECHNIQUE: 6.14 mCi F-18 FDG was injected intravenously. Full-ring PET imaging was performed from the skull base to thigh after the radiotracer. CT data was obtained and used for attenuation correction and anatomic localization. Fasting blood glucose: 112 mg/dl COMPARISON:  PET-CT 11/11/2019 FINDINGS: Mediastinal blood pool activity: SUV max 2.16 Liver activity: SUV max NA NECK: No hypermetabolic lymph nodes in the neck. Incidental CT findings: Postsurgical changes identified involving the right temporal bone. CHEST: No FDG avid supraclavicular, axillary, mediastinal, or hilar lymph nodes. Post treatment changes identified within the periphery of the right lower lobe. No signs of recurrent FDG avid tumor. Incidental CT findings: Centrilobular and paraseptal emphysema. Aortic atherosclerosis. Coronary artery calcifications. ABDOMEN/PELVIS: There are at least 3 distinct FDG avid tumors within the lateral segment of left hepatic lobe. The dominant lesion measures 3.9 cm with an SUV max of 17.83. No abnormal FDG uptake identified within the pancreas, spleen, or adrenal glands. No FDG avid abdominopelvic lymph nodes. Incidental CT findings: Aortic atherosclerosis. SKELETON: No focal hypermetabolic activity to suggest skeletal metastasis. Incidental CT findings: Status post bilateral hip arthroplasty IMPRESSION: 1. There are multiple FDG avid metastases identified within the lateral segment of left hepatic lobe  measuring up to 3.9 cm. 2. Post treatment changes within the right lower lobe without signs of residual/recurrent FDG avid tumor. 3. Aortic Atherosclerosis (ICD10-I70.0) and Emphysema (ICD10-J43.9). Electronically Signed   By: Kerby Moors M.D.   On: 07/29/2020 11:16   US BIOPSY (LIVER)  Result Date: 08/08/2020 INDICATION: 80 year old woman with history of lung cancer and new left liver lesions presents to interventional radiology for ultrasound-guided biopsy. EXAM: Ultrasound-guided biopsy of left liver mass MEDICATIONS: None. ANESTHESIA/SEDATION: Moderate (conscious) sedation was employed during this procedure. A total of Versed 2 mg and Fentanyl 100 mcg was administered intravenously. Moderate Sedation Time: 8 minutes. The patient's level of consciousness and vital signs were monitored continuously by radiology nursing throughout the procedure under my direct supervision. COMPLICATIONS: None immediate. PROCEDURE: Informed written consent was obtained from the patient after a thorough discussion of the procedural risks, benefits and alternatives. All questions were addressed. Maximal Sterile Barrier Technique was utilized including caps, mask, sterile gowns, sterile gloves, sterile drape, hand hygiene and skin antiseptic. A timeout was performed prior to the initiation of the procedure. Patient position supine on the ultrasound table. Epigastric skin prepped and draped in usual  sterile fashion. Following local lidocaine administration, three 18 gauge cores were obtained from 1 of the left liver lesions utilizing continuous ultrasound guidance. Samples were sent to pathology in formalin. Needle removed and hemostasis achieved with 5 minutes of manual compression. Post procedure ultrasound images showed no evidence of significant hemorrhage. IMPRESSION: Ultrasound-guided biopsy of left liver mass. Electronically Signed   By: Miachel Roux M.D.   On: 08/08/2020 09:24    PERFORMANCE STATUS (ECOG) : 2 -  Symptomatic, <50% confined to bed  Review of Systems Unless otherwise noted, a complete review of systems is negative.  Physical Exam General: NAD Pulmonary: Unlabored Extremities: no edema, no joint deformities Skin: no rashes Neurological: Weakness, has word searching  IMPRESSION: Patient was acute add-on to my clinic schedule today at Dr. Elroy Channel request.  Recent PET scan on 07/28/2008 revealed multiple FDG avid metastases in the liver.  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  Patient has had some neurologic changes including frequent word searching and falls.  She is being referred for MRI of the brain.  Dr. Janese Banks has met with patient to discuss possible treatment options including targeted therapies, systemic chemotherapy, immunotherapy, or best supportive care/hospice.  Patient thinks that she is interested in pursuing treatment but has not arrived at a final decision.    Symptomatically, she endorses occasional pain but denies other distressing symptoms.  She does live at home alone but has good support from her daughter who lives nearby.  And concerned that patient has fallen several times over the past month.  We will refer her for rehab screening with [facility], [place]. Consider outpatient/home rehab.   Will also consult home palliative care.   Patient says she has ACP documents at home and I requested that she bring those in to clinic.  I sent her home with a MOST form to review with her daughter.  PLAN: -Continue current scope of treatment -Referral for rehab screening -Referral for home palliative care -Patient to bring in ACP documents -MOST Form reviewed -RTC 3 to 4 weeks  Case and plan discussed with Dr. Janese Banks   Patient expressed understanding and was in agreement with this plan. She also understands that She can call the clinic at any time with any questions, concerns, or complaints.      Time Total: 25 minutes  Visit consisted of counseling and education dealing with the complex and emotionally intense issues of symptom management and palliative care in the setting of serious and potentially life-threatening illness.Greater than 50%  of this time was spent counseling and coordinating care related to the above assessment and plan.  Signed by: Altha Harm, PhD, NP-C

## 2020-08-19 NOTE — Progress Notes (Signed)
Pt sts having problems walking lately she does not have any pain today .  Pt has fallen two days ago she was home alone and sts she did not hurt herself . Pt seems like she having problems making sentences . (Finding words )

## 2020-08-19 NOTE — Progress Notes (Signed)
Hematology/Oncology Consult note [facility]  Telephone:([phone] Fax:([phone]) [phone]  Patient Care Team: Lavera Guise, MD as PCP - General (Internal Medicine) Telford Nab, RN as Registered Nurse   Name of the patient: Latoya Lynch  [id number]  1940/06/08   Date of visit: 08/19/20  Diagnosis-history of lung cancer now with liver metastases  Chief complaint/ Reason for visit-discuss biopsy results and further management  Heme/Onc history: Patient is a 80 year old female who was found to have a 2.1 cm right lower lobe lung mass.  N March 2020 and she underwent a bronchoscopy which did not reveal any malignancy.  Cytology was suspicious but not diagnostic for malignancy.  She then underwent SBRT for the same.  Subsequently in August 2021 she was found to have an endoluminal filling defect in the area of the right hilum.  She again underwent empiric radiation to this area which was hypermetabolic on PET CT scan measuring 2.4 cm.  She has not required any systemic chemotherapy so far    Recent surveillance CT scan in May 2022 showed concerning liver metastases and was followed by a PET scan.  PET scan showed multiple FDG avid metastases in the lateral segment of the left hepatic lobe measuring up to 3.9 cm.  No evidence of recurrent tumor was seen in the lung.  No other evidence of metastatic disease.     Interval history-reports having word finding difficulty and has been feeling fatigued overall.  Appetite is poor.  Feels tremulous when she walks and recently had a fall but did not lose consciousness or hurt her head.  Has baseline cough which is intermittent  ECOG PS- 1-2 Pain scale- 0   Review of systems- Review of Systems  Constitutional:  Positive for malaise/fatigue. Negative for chills, fever and weight loss.  HENT:  Negative for congestion, ear discharge and nosebleeds.   Eyes:  Negative for blurred vision.  Respiratory:  Positive for  cough. Negative for hemoptysis, sputum production, shortness of breath and wheezing.   Cardiovascular:  Negative for chest pain, palpitations, orthopnea and claudication.  Gastrointestinal:  Negative for abdominal pain, blood in stool, constipation, diarrhea, heartburn, melena, nausea and vomiting.  Genitourinary:  Negative for dysuria, flank pain, frequency, hematuria and urgency.  Musculoskeletal:  Negative for back pain, joint pain and myalgias.  Skin:  Negative for rash.  Neurological:  Negative for dizziness, tingling, focal weakness, seizures, weakness and headaches.       Word finding difficulty  Endo/Heme/Allergies:  Does not bruise/bleed easily.  Psychiatric/Behavioral:  Negative for depression and suicidal ideas. The patient does not have insomnia.       Allergies  Allergen Reactions   Other Anaphylaxis    All nuts   Peanut Oil Anaphylaxis   Prochlorperazine Anaphylaxis     Past Medical History:  Diagnosis Date   Arthritis    COPD (chronic obstructive pulmonary disease) (HCC)    Depression    History of methicillin resistant staphylococcus aureus (MRSA)    Hyperlipidemia    Lung mass    right lung   Osteoarthritis      Past Surgical History:  Procedure Laterality Date   CATARACT EXTRACTION W/ INTRAOCULAR LENS  IMPLANT, BILATERAL     CEREBRAL ANEURYSM REPAIR  01/2003   ELECTROMAGNETIC NAVIGATION BROCHOSCOPY Right 07/30/2018   Procedure: ELECTROMAGNETIC NAVIGATION BRONCHOSCOPY RIGHT;  Surgeon: Tyler Pita, MD;  Location: [facility] ORS;  Service: Cardiopulmonary;  Laterality: Right;   ENDOBRONCHIAL ULTRASOUND Right 07/30/2018   Procedure:  ENDOBRONCHIAL ULTRASOUND RIGHT;  Surgeon: Tyler Pita, MD;  Location: [facility] ORS;  Service: Cardiopulmonary;  Laterality: Right;   retinal detatchment Right 05/2017   TONSILLECTOMY     TOTAL HIP ARTHROPLASTY Right 01/2015   TOTAL HIP ARTHROPLASTY Left 05/2019   in [place] History    Socioeconomic History   Marital status: Widowed    Spouse name: Not on file   Number of children: 3   Years of education: Not on file   Highest education level: Not on file  Occupational History   Not on file  Tobacco Use   Smoking status: Every Day    Packs/day: 0.50    Years: 60.00    Pack years: 30.00    Types: Cigarettes   Smokeless tobacco: Never   Tobacco comments:    about 5-10 cigarettes a day-11/18/2019  Vaping Use   Vaping Use: Never used  Substance and Sexual Activity   Alcohol use: Not Currently    Comment: once in a great while   Drug use: Never   Sexual activity: Not Currently  Other Topics Concern   Not on file  Social History Narrative   Lives by herself with a Risk analyst    Social Determinants of Health   Financial Resource Strain: Not on file  Food Insecurity: Not on file  Transportation Needs: Not on file  Physical Activity: Not on file  Stress: Not on file  Social Connections: Not on file  Intimate Partner Violence: Not on file    Family History  Problem Relation Age of Onset   Heart attack Mother    Cancer Maternal Grandmother 80       spine ca   Heart attack Maternal Grandfather      Current Outpatient Medications:    buPROPion (WELLBUTRIN SR) 150 MG 12 hr tablet, Take 150 mg by mouth 2 (two) times daily. , Disp: , Rfl:    cetirizine (ZYRTEC) 10 MG tablet, Take 10 mg by mouth daily., Disp: , Rfl:    chlorpheniramine-HYDROcodone (TUSSIONEX) 10-8 MG/5ML SUER, Take 5 mLs by mouth every 12 (twelve) hours as needed for cough., Disp: 140 mL, Rfl: 0   EPINEPHrine 0.3 mg/0.3 mL IJ SOAJ injection, Inject 0.3 mg into the muscle as needed for anaphylaxis., Disp: , Rfl:    ipratropium-albuterol (DUONEB) 0.5-2.5 (3) MG/3ML SOLN, USE 1 VIAL  IN  NEBULIZER TWICE  DAILY, Disp: 180 mL, Rfl: 11   naproxen sodium (ALEVE) 220 MG tablet, Take 440 mg by mouth every morning. , Disp: , Rfl:    Omega-3 1000 MG CAPS, Take 1,000 mg by mouth daily. , Disp: , Rfl:     rosuvastatin (CRESTOR) 5 MG tablet, Take 1 tablet (5 mg total) by mouth daily., Disp: 90 tablet, Rfl: 1   vitamin B-12 (CYANOCOBALAMIN) 500 MCG tablet, Take 500 mcg by mouth daily., Disp: , Rfl:    meloxicam (MOBIC) 15 MG tablet, Take 15 mg by mouth 3 (three) times daily.  (Patient not taking: Reported on 08/19/2020), Disp: , Rfl:    methocarbamol (ROBAXIN) 750 MG tablet, Take 750 mg by mouth daily. (Patient not taking: Reported on 08/19/2020), Disp: , Rfl:    riboflavin (VITAMIN B-2) 100 MG TABS tablet, Take 200 mg by mouth in the morning and at bedtime.  (Patient not taking: No sig reported), Disp: , Rfl:   Physical exam:  Vitals:   08/19/20 1010  BP: 118/70  Pulse: (!) 101  Resp: 17  Temp: 98.4 F (36.9 C)  SpO2: 93%  Weight: 114 lb 9.6 oz (52 kg)  Height: 5\' 2"  (1.575 m)   Physical Exam Constitutional:      Comments: Appears fatigued.  Sitting in a wheelchair  Cardiovascular:     Rate and Rhythm: Normal rate and regular rhythm.     Heart sounds: Normal heart sounds.  Pulmonary:     Effort: Pulmonary effort is normal.     Breath sounds: Normal breath sounds.  Abdominal:     General: Bowel sounds are normal.     Palpations: Abdomen is soft.  Skin:    General: Skin is warm and dry.  Neurological:     Mental Status: She is alert and oriented to person, place, and time.     CMP Latest Ref Rng & Units 10/28/2019  Glucose 70 - 99 mg/dL 103(H)  BUN 8 - 23 mg/dL 19  Creatinine 0.44 - 1.00 mg/dL 0.94  Sodium 135 - 145 mmol/L 141  Potassium 3.5 - 5.1 mmol/L 3.7  Chloride 98 - 111 mmol/L 107  CO2 22 - 32 mmol/L 25  Calcium 8.9 - 10.3 mg/dL 8.9  Total Protein 6.5 - 8.1 g/dL 6.7  Total Bilirubin 0.3 - 1.2 mg/dL 0.9  Alkaline Phos 38 - 126 U/L 75  AST 15 - 41 U/L 26  ALT 0 - 44 U/L 20   CBC Latest Ref Rng & Units 08/08/2020  WBC 4.0 - 10.5 K/uL 8.5  Hemoglobin 12.0 - 15.0 g/dL 12.1  Hematocrit 36.0 - 46.0 % 35.5(L)  Platelets 150 - 400 K/uL 326    No images are attached  to the encounter.  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 Intermittent productive cough x1 year. Current smoker with 60 pack-year smoking history. EXAM: CT CHEST WITHOUT CONTRAST TECHNIQUE: Multidetector CT imaging of the chest was performed following the standard protocol without IV contrast. COMPARISON:  03/01/2020, 10/28/2019, PET CT 11/11/2019 FINDINGS: Cardiovascular: Extensive multi-vessel coronary artery calcification. Global cardiac size is within normal limits. No pericardial effusion. The central pulmonary arteries are of normal caliber. The thoracic aorta is of normal caliber. Moderate atherosclerotic calcification is seen throughout the thoracic aorta. Mediastinum/Nodes: Stable 6 mm nodule within the left thyroid lobe. No pathologic thoracic adenopathy. The thoracic esophagus is unremarkable. Lungs/Pleura: Mild biapical pleuroparenchymal scarring, right greater than left. Mild apically predominant emphysema. Nodular scarring within the right lower lobe appears stable since prior examination and was metabolically negative on prior PET CT examination. As noted on prior examination, the right infrahilar mass seen on examination of 10/28/2019 is not discretely visualized. There is extensive bronchial wall thickening noted within the a right hilum and right lower lobe as well as extensive airway impaction within the right lower lobe and, to a lesser extent, the right middle lobe in keeping with acute infection and/or airway inflammation. There has developed marked central airway narrowing involving the right middle lobe, best noted on image # 82/3. Mild subpleural atelectasis or infiltrate has developed within the posterior segment of the right lower lobe, possibly representing post radiation change. No new focal pulmonary nodule identified. No pneumothorax or  pleural effusion. Upper Abdomen: An ill-defined hypodense mass is developed within the left hepatic lobe measuring at least 3.8 x 2.7 x 3.6 cm, incompletely evaluated on this noncontrast examination and not fully included on this exam, but  suspicious for interval development of a hepatic metastasis. An additional 15 mm nodule is noted slightly laterally. The adrenal glands are unremarkable. No acute abnormality. Musculoskeletal: No lytic or blastic bone lesion. No acute bone abnormality. IMPRESSION: Interval development of multiple ill-defined and poorly characterized intrahepatic masses suspicious for development of hepatic metastatic disease. Dedicated CT examination of the abdomen pelvis with contrast is recommended for further evaluation. Progressive bronchial wall thickening, marked narrowing of the central airways of the right middle lobe, and development of subpleural atelectasis or infiltrate within the posterior right upper lobe all possibly reflecting changes of radiation therapy. Superimposed extensive airway impaction is noted, particularly within the right lower lobe, and can be seen in the setting of acute infection and/or airway inflammation. Stable post radiation changes within the right lower lobe. Mild emphysema. Extensive coronary artery calcification. Aortic Atherosclerosis (ICD10-I70.0) and Emphysema (ICD10-J43.9). Electronically Signed   By: Fidela Salisbury MD   On: 07/23/2020 23:01   NM PET Image Restag (PS) Skull Base To Thigh  Result Date: 07/29/2020 CLINICAL DATA:  Subsequent treatment strategy for lung cancer. EXAM: NUCLEAR MEDICINE PET SKULL BASE TO THIGH TECHNIQUE: 6.14 mCi F-18 FDG was injected intravenously. Full-ring PET imaging was performed from the skull base to thigh after the radiotracer. CT data was obtained and used for attenuation correction and anatomic localization. Fasting blood glucose: 112 mg/dl COMPARISON:  PET-CT 11/11/2019 FINDINGS: Mediastinal blood pool activity:  SUV max 2.16 Liver activity: SUV max NA NECK: No hypermetabolic lymph nodes in the neck. Incidental CT findings: Postsurgical changes identified involving the right temporal bone. CHEST: No FDG avid supraclavicular, axillary, mediastinal, or hilar lymph nodes. Post treatment changes identified within the periphery of the right lower lobe. No signs of recurrent FDG avid tumor. Incidental CT findings: Centrilobular and paraseptal emphysema. Aortic atherosclerosis. Coronary artery calcifications. ABDOMEN/PELVIS: There are at least 3 distinct FDG avid tumors within the lateral segment of left hepatic lobe. The dominant lesion measures 3.9 cm with an SUV max of 17.83. No abnormal FDG uptake identified within the pancreas, spleen, or adrenal glands. No FDG avid abdominopelvic lymph nodes. Incidental CT findings: Aortic atherosclerosis. SKELETON: No focal hypermetabolic activity to suggest skeletal metastasis. Incidental CT findings: Status post bilateral hip arthroplasty IMPRESSION: 1. There are multiple FDG avid metastases identified within the lateral segment of left hepatic lobe measuring up to 3.9 cm. 2. Post treatment changes within the right lower lobe without signs of residual/recurrent FDG avid tumor. 3. Aortic Atherosclerosis (ICD10-I70.0) and Emphysema (ICD10-J43.9). Electronically Signed   By: Kerby Moors M.D.   On: 07/29/2020 11:16   US BIOPSY (LIVER)  Result Date: 08/08/2020 INDICATION: 80 year old woman with history of lung cancer and new left liver lesions presents to interventional radiology for ultrasound-guided biopsy. EXAM: Ultrasound-guided biopsy of left liver mass MEDICATIONS: None. ANESTHESIA/SEDATION: Moderate (conscious) sedation was employed during this procedure. A total of Versed 2 mg and Fentanyl 100 mcg was administered intravenously. Moderate Sedation Time: 8 minutes. The patient's level of consciousness and vital signs were monitored continuously by radiology nursing throughout the  procedure under my direct supervision. COMPLICATIONS: None immediate. PROCEDURE: Informed written consent was obtained from the patient after a thorough discussion of the procedural risks, benefits and alternatives. All questions were addressed. Maximal Sterile Barrier Technique was utilized including caps, mask, sterile gowns, sterile gloves, sterile drape, hand hygiene and skin antiseptic. A timeout was performed prior to the initiation of the procedure. Patient position supine on the ultrasound table. Epigastric skin prepped and draped in  usual sterile fashion. Following local lidocaine administration, three 18 gauge cores were obtained from 1 of the left liver lesions utilizing continuous ultrasound guidance. Samples were sent to pathology in formalin. Needle removed and hemostasis achieved with 5 minutes of manual compression. Post procedure ultrasound images showed no evidence of significant hemorrhage. IMPRESSION: Ultrasound-guided biopsy of left liver mass. Electronically Signed   By: Miachel Roux M.D.   On: 08/08/2020 09:24     Assessment and plan- Patient is a 80 y.o. female with presumed history of prior lung cancer which  was not biopsy-proven.  Patient underwent empiric radiation in 2020 and 2021.  Scans in May 2022 showed liver lesions which was consistent with biopsy-proven squamous cell carcinoma.  She is here to discuss pathology results and further management  Discussed the results of biopsy which was consistent with squamous cell carcinoma compatible with lung origin.  This denotes stage IV disease.  Discussed that treatment will be palliative and not curative.  We will be sending off on ACE testing on her tumor specimen today to see if she would be a candidate for any targetable treatment.  Options moving forward include the following:  Targeted treatment of the rarely actionable mutations found on NGS testing If no such mutations are found in combination chemotherapy and immunotherapy is  an option versus single agent immunotherapy If patient decides not to pursue any active treatment for malignancy then pursuing home hospice would be reasonable.   Patient is currently willing to wait for NGS testing results and may consider single agent immunotherapy.  I will see her back in 3 weeks time to discuss the results of her tests.  She is relatively asymptomatic from her lung cancer standpoint.  We will obtain MRI brain with and without contrast to rule out intracranial metastatic disease.  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 Goals of care, counseling/discussion   2. 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 Randa Evens, MD, MPH [facility] at [facility] [phone] 08/19/2020 5:44 PM

## 2020-08-22 ENCOUNTER — Telehealth: Payer: Self-pay | Admitting: Oncology

## 2020-08-22 NOTE — Telephone Encounter (Signed)
Left VM with patient's daughter to make her aware of upcoming appointments.

## 2020-08-26 ENCOUNTER — Other Ambulatory Visit: Payer: Self-pay

## 2020-08-26 DIAGNOSIS — E782 Mixed hyperlipidemia: Secondary | ICD-10-CM

## 2020-08-26 MED ORDER — ROSUVASTATIN CALCIUM 5 MG PO TABS: 5.0000 mg | ORAL_TABLET | Freq: Every day | ORAL | 1 refills | Status: DC

## 2020-08-31 ENCOUNTER — Ambulatory Visit
Admission: RE | Admit: 2020-08-31 | Discharge: 2020-08-31 | Disposition: A | Discharge status: Home / Self Care | Payer: Medicare Other | Source: Ambulatory Visit | Attending: Oncology | Admitting: Oncology

## 2020-08-31 ENCOUNTER — Other Ambulatory Visit: Payer: Self-pay

## 2020-08-31 DIAGNOSIS — Z85118 Personal history of other malignant neoplasm of bronchus and lung: Secondary | ICD-10-CM | POA: Insufficient documentation

## 2020-08-31 DIAGNOSIS — Z9889 Other specified postprocedural states: Secondary | ICD-10-CM | POA: Diagnosis not present

## 2020-08-31 DIAGNOSIS — C3490 Malignant neoplasm of unspecified part of unspecified bronchus or lung: Secondary | ICD-10-CM | POA: Diagnosis not present

## 2020-08-31 DIAGNOSIS — I6782 Cerebral ischemia: Secondary | ICD-10-CM | POA: Diagnosis not present

## 2020-08-31 DIAGNOSIS — I639 Cerebral infarction, unspecified: Secondary | ICD-10-CM | POA: Diagnosis not present

## 2020-08-31 MED ORDER — GADOBUTROL 1 MMOL/ML IV SOLN
5.0000 mL | Freq: Once | INTRAVENOUS | Status: AC | PRN
  Administered 2020-08-31: 5 mL via INTRAVENOUS

## 2020-09-09 ENCOUNTER — Encounter: Payer: Self-pay | Admitting: Oncology

## 2020-09-12 ENCOUNTER — Other Ambulatory Visit: Payer: Self-pay

## 2020-09-12 ENCOUNTER — Inpatient Hospital Stay (HOSPITAL_BASED_OUTPATIENT_CLINIC_OR_DEPARTMENT_OTHER): Payer: Medicare Other | Admitting: Hospice and Palliative Medicine

## 2020-09-12 ENCOUNTER — Encounter: Payer: Self-pay | Admitting: Oncology

## 2020-09-12 ENCOUNTER — Inpatient Hospital Stay: Payer: Medicare Other | Attending: Oncology | Admitting: Oncology

## 2020-09-12 VITALS — BP 117/67 | HR 114 | Temp 98.0°F | Resp 17 | Wt 105.0 lb

## 2020-09-12 DIAGNOSIS — Z5111 Encounter for antineoplastic chemotherapy: Secondary | ICD-10-CM | POA: Insufficient documentation

## 2020-09-12 DIAGNOSIS — Z7952 Long term (current) use of systemic steroids: Secondary | ICD-10-CM | POA: Insufficient documentation

## 2020-09-12 DIAGNOSIS — E876 Hypokalemia: Secondary | ICD-10-CM | POA: Insufficient documentation

## 2020-09-12 DIAGNOSIS — Z5112 Encounter for antineoplastic immunotherapy: Secondary | ICD-10-CM | POA: Insufficient documentation

## 2020-09-12 DIAGNOSIS — E871 Hypo-osmolality and hyponatremia: Secondary | ICD-10-CM | POA: Diagnosis not present

## 2020-09-12 DIAGNOSIS — Z7189 Other specified counseling: Secondary | ICD-10-CM

## 2020-09-12 DIAGNOSIS — J449 Chronic obstructive pulmonary disease, unspecified: Secondary | ICD-10-CM | POA: Insufficient documentation

## 2020-09-12 DIAGNOSIS — E785 Hyperlipidemia, unspecified: Secondary | ICD-10-CM | POA: Diagnosis not present

## 2020-09-12 DIAGNOSIS — Z515 Encounter for palliative care: Secondary | ICD-10-CM | POA: Diagnosis not present

## 2020-09-12 DIAGNOSIS — Z79899 Other long term (current) drug therapy: Secondary | ICD-10-CM | POA: Insufficient documentation

## 2020-09-12 DIAGNOSIS — Z5189 Encounter for other specified aftercare: Secondary | ICD-10-CM | POA: Insufficient documentation

## 2020-09-12 DIAGNOSIS — C3431 Malignant neoplasm of lower lobe, right bronchus or lung: Secondary | ICD-10-CM | POA: Insufficient documentation

## 2020-09-12 DIAGNOSIS — F17210 Nicotine dependence, cigarettes, uncomplicated: Secondary | ICD-10-CM | POA: Insufficient documentation

## 2020-09-12 DIAGNOSIS — R160 Hepatomegaly, not elsewhere classified: Secondary | ICD-10-CM

## 2020-09-12 DIAGNOSIS — E860 Dehydration: Secondary | ICD-10-CM | POA: Diagnosis not present

## 2020-09-12 DIAGNOSIS — C3491 Malignant neoplasm of unspecified part of right bronchus or lung: Secondary | ICD-10-CM

## 2020-09-12 DIAGNOSIS — C787 Secondary malignant neoplasm of liver and intrahepatic bile duct: Secondary | ICD-10-CM

## 2020-09-12 DIAGNOSIS — D649 Anemia, unspecified: Secondary | ICD-10-CM | POA: Insufficient documentation

## 2020-09-12 NOTE — Progress Notes (Signed)
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  N March 2020 and she underwent a bronchoscopy which did not reveal any malignancy.  Cytology was suspicious but not diagnostic for malignancy.  She then underwent SBRT for the same.  Subsequently in August 2021 she was found to have an endoluminal filling defect in the area of the right hilum.  She again underwent empiric radiation to this area which was hypermetabolic on PET CT scan measuring 2.4 cm.  She has not required any systemic chemotherapy so far    Recent surveillance CT scan in May 2022 showed concerning liver metastases and was followed by a PET scan.  PET scan showed multiple FDG avid metastases in the lateral segment of the left hepatic lobe measuring up to 3.9 cm.  No evidence of recurrent tumor was seen in the lung.  No other evidence of metastatic disease.     NGS testing showed PD-L1 of 95% but no other actionable mutations.Tumor positive for T p53 and NF P2 L2R34G  Interval history-patient is here with her son-in-law today.  No acute issues since last visit.  She continues to have generalized fatigue and word finding difficulty which has been unchanged for the last couple of months.  Denies any pain  ECOG PS- 2 Pain scale- 0   Review of systems- Review of Systems  Constitutional:  Positive for malaise/fatigue. Negative for chills, fever and weight loss.  HENT:  Negative for  congestion, ear discharge and nosebleeds.   Eyes:  Negative for blurred vision.  Respiratory:  Negative for cough, hemoptysis, sputum production, shortness of breath and wheezing.   Cardiovascular:  Negative for chest pain, palpitations, orthopnea and claudication.  Gastrointestinal:  Negative for abdominal pain, blood in stool, constipation, diarrhea, heartburn, melena, nausea and vomiting.  Genitourinary:  Negative for dysuria, flank pain, frequency, hematuria and urgency.  Musculoskeletal:  Negative for back pain, joint pain and myalgias.  Skin:  Negative for rash.  Neurological:  Negative for dizziness, tingling, focal weakness, seizures, weakness and headaches.  Endo/Heme/Allergies:  Does not bruise/bleed easily.  Psychiatric/Behavioral:  Negative for depression and suicidal ideas. The patient does not have insomnia.       Allergies  Allergen Reactions   Other Anaphylaxis    All nuts   Peanut Oil Anaphylaxis   Prochlorperazine Anaphylaxis     Past Medical History:  Diagnosis Date   Arthritis    COPD (chronic obstructive pulmonary disease) (HCC)    Depression    History of methicillin resistant staphylococcus aureus (MRSA)    Hyperlipidemia    Lung mass    right lung   Osteoarthritis      Past Surgical History:  Procedure Laterality Date   CATARACT EXTRACTION W/ INTRAOCULAR LENS  IMPLANT, BILATERAL     CEREBRAL ANEURYSM REPAIR  01/2003   ELECTROMAGNETIC NAVIGATION BROCHOSCOPY Right 07/30/2018   Procedure: ELECTROMAGNETIC NAVIGATION BRONCHOSCOPY RIGHT;  Surgeon: Tyler Pita, MD;  Location: [facility] ORS;  Service: Cardiopulmonary;  Laterality: Right;   ENDOBRONCHIAL ULTRASOUND Right 07/30/2018   Procedure: ENDOBRONCHIAL ULTRASOUND RIGHT;  Surgeon: Tyler Pita, MD;  Location: [facility] ORS;  Service: Cardiopulmonary;  Laterality: Right;   retinal detatchment Right 05/2017   TONSILLECTOMY     TOTAL HIP ARTHROPLASTY Right 01/2015   TOTAL HIP ARTHROPLASTY Left 05/2019    in [place] History   Socioeconomic History   Marital status: Widowed    Spouse name: Not on file   Number of children: 3   Years of education: Not on file   Highest education level: Not on file  Occupational History   Not on file  Tobacco Use   Smoking status: Every Day    Packs/day: 0.50    Years: 60.00    Pack years: 30.00    Types: Cigarettes   Smokeless tobacco: Never   Tobacco comments:    about 5-10 cigarettes a day-11/18/2019  Vaping Use   Vaping Use: Never used  Substance and Sexual Activity   Alcohol use: Not Currently    Comment: once in a great while   Drug use: Never   Sexual activity: Not Currently  Other Topics Concern   Not on file  Social History Narrative   Lives by herself with a Risk analyst    Social Determinants of Health   Financial Resource Strain: Not on file  Food Insecurity: Not on file  Transportation Needs: Not on file  Physical Activity: Not on file  Stress: Not on file  Social Connections: Not on file  Intimate Partner Violence: Not on file    Family History  Problem Relation Age of Onset   Heart attack Mother    Cancer Maternal Grandmother 80       spine ca   Heart attack Maternal Grandfather      Current Outpatient Medications:    buPROPion (WELLBUTRIN SR) 150 MG 12 hr tablet, Take 150 mg by mouth 2 (two) times daily. , Disp: , Rfl:    cetirizine (ZYRTEC) 10 MG tablet, Take 10 mg by mouth daily., Disp: , Rfl:    chlorpheniramine-HYDROcodone (TUSSIONEX) 10-8 MG/5ML SUER, Take 5 mLs by mouth every 12 (twelve) hours as needed for cough., Disp: 140 mL, Rfl: 0   EPINEPHrine 0.3 mg/0.3 mL IJ SOAJ injection, Inject 0.3 mg into the muscle as needed for anaphylaxis., Disp: , Rfl:    ipratropium-albuterol (DUONEB) 0.5-2.5 (3) MG/3ML SOLN, USE 1 VIAL  IN  NEBULIZER TWICE  DAILY, Disp: 180 mL, Rfl: 11   naproxen sodium (ALEVE) 220 MG tablet, Take 440 mg by mouth every morning. , Disp: , Rfl:    Omega-3  1000 MG CAPS, Take 1,000 mg by mouth daily. , Disp: , Rfl:    vitamin B-12 (CYANOCOBALAMIN) 500 MCG tablet, Take 500 mcg by mouth daily., Disp: , Rfl:    meloxicam (MOBIC) 15 MG tablet, Take 15 mg by mouth 3 (three) times daily.  (Patient not taking: No sig reported), Disp: , Rfl:    methocarbamol (ROBAXIN) 750 MG tablet, Take 750 mg by mouth daily. (Patient not taking: No sig reported), Disp: , Rfl:    riboflavin (VITAMIN B-2) 100 MG TABS tablet, Take 200 mg by mouth in the morning and at bedtime.  (Patient not taking: No sig reported), Disp: , Rfl:    rosuvastatin (CRESTOR) 5 MG tablet, Take 1 tablet (5 mg total) by mouth daily. (Patient not taking: Reported on 09/12/2020), Disp: 90 tablet, Rfl: 1  Physical exam:  Vitals:   09/12/20 1014  BP: 117/67  Pulse: (!) 114  Resp: 17  Temp: 98 F (36.7 C)  TempSrc: Tympanic  SpO2: 98%  Weight: 105 lb (47.6 kg)   Physical Exam Constitutional:      Comments: She is elderly and somewhat frail.  Sitting in a wheelchair appears in no acute distress  Cardiovascular:     Rate and Rhythm: Normal rate and regular rhythm.     Heart sounds: Normal heart sounds.  Pulmonary:     Effort: Pulmonary effort is normal.     Breath sounds: Normal breath sounds.  Abdominal:     General: Bowel sounds are normal.     Palpations: Abdomen is soft.  Skin:    General: Skin is warm and dry.  Neurological:     Mental Status: She is alert and oriented to person, place, and time.     CMP Latest Ref Rng & Units 10/28/2019  Glucose 70 - 99 mg/dL 103(H)  BUN 8 - 23 mg/dL 19  Creatinine 0.44 - 1.00 mg/dL 0.94  Sodium 135 - 145 mmol/L 141  Potassium 3.5 - 5.1 mmol/L 3.7  Chloride 98 - 111 mmol/L 107  CO2 22 - 32 mmol/L 25  Calcium 8.9 - 10.3 mg/dL 8.9  Total Protein 6.5 - 8.1 g/dL 6.7  Total Bilirubin 0.3 - 1.2 mg/dL 0.9  Alkaline Phos 38 - 126 U/L 75  AST 15 - 41 U/L 26  ALT 0 - 44 U/L 20   CBC Latest Ref Rng & Units 08/08/2020  WBC 4.0 - 10.5 K/uL 8.5   Hemoglobin 12.0 - 15.0 g/dL 12.1  Hematocrit 36.0 - 46.0 % 35.5(L)  Platelets 150 - 400 K/uL 326    No images are attached to the encounter.  MR Brain W Wo Contrast  Result Date: 08/31/2020 CLINICAL DATA:  Non-small cell lung cancer, monitor. EXAM: MRI HEAD WITHOUT AND WITH CONTRAST TECHNIQUE: Multiplanar, multiecho pulse sequences of the brain and surrounding structures were obtained without and with intravenous contrast. CONTRAST:  32mL GADAVIST GADOBUTROL 1 MMOL/ML IV SOLN COMPARISON:  Head CT April 05, 2020. FINDINGS: Brain: No acute infarction, hemorrhage, hydrocephalus, extra-axial collection or mass lesion. 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 Scattered and confluent foci of T2 hyperintensity are seen within the white matter of the cerebral hemispheres and within the pons, nonspecific, most likely related to chronic small vessel ischemia. Susceptibility artifact lining the cerebral sulci in the bilateral parietooccipital region suggestive of superficial siderosis. Moderate parenchymal volume loss. No focus of abnormal contrast enhancement. Vascular: Normal flow voids. Skull and upper cervical spine: Postsurgical changes from right frontal craniotomy. Marrow signal characteristics are otherwise maintained. Soft tissues thickening posterior to the odontoid process may be degenerative versus CPPD. Sinuses/Orbits: Bilateral lens surgery. Paranasal sinuses are clear. IMPRESSION: 1. No focus of abnormal contrast enhancement to suggest intracranial metastatic disease. 2. 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 3. Moderate chronic microvascular ischemic changes of the white matter and parenchymal volume loss. 4. 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 Electronically Signed   By: Pedro Earls M.D.   On: 08/31/2020 13:09      Assessment and plan- Patient is a 79 y.o. female with prior history of lung cancer now with biopsy-proven squamous cell carcinoma and liver metastases here to discuss NGS testing and further management  Discussed with the patient and NGS  testing did not show any actionable mutation.  PD-L1 was greater than 95%.  Options at this time include combination chemoimmunotherapy with [facility] given IV every 3 weeks for 4 cycles followed by [facility] alone every 3 weeks until progression or toxicity.  Discussed risks and benefits of chemotherapy including all but not limited to nausea, vomiting, low blood counts, risk of infections and hospitalization.  Risk of peripheral neuropathy and loss and infusion reaction associated with Taxol.  Treatment will be given with a palliative intent.  Patient does not desire to pursue chemotherapy at all then the next best option would be single agent immunotherapy with Keytruda every 3 weeks until progression or toxicity.  Patient is leaning towards option 1 but would like to discuss with her family members and get back to [place] in a day or so.  Will need port placement and chemo teach prior to starting treatment.  If patient agrees to proceed with treatment I will tentatively see her back in a week to 10 days to start cycle 1 of either chemoimmunotherapy or immunotherapy alone based on what she decides  Patient will also be seen by palliative care later today and will be referred for home palliative care and nutrition counseling.  Treatment will be given with a palliative intent     Visit Diagnosis 1. Goals of care, counseling/discussion   2. 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 Liver metastases ([place])      Dr. Randa Evens, MD, MPH [facility] [phone] 09/12/2020 5:06 PM

## 2020-09-12 NOTE — Progress Notes (Signed)
[place]  [phone] [phone]   Name: Latoya Lynch Date: 09/12/2020 MRN: [id number]  DOB: [birth year]  Patient Care Team: Lavera Guise, MD as PCP - General (Internal Medicine) Telford Nab, RN as Registered Nurse    REASON FOR CONSULTATION: Latoya Lynch is a 80 y.o. 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  Patient has had falls and word searching.  She was referred to palliative care to help address goals and manage ongoing symptoms.  SOCIAL HISTORY:     reports that she has been smoking cigarettes. She has a 30.00 pack-year smoking history. She has never used smokeless tobacco. She reports previous alcohol use. She reports that she does not use drugs.  Patient is unmarried lives at home alone.  She has a daughter who lives nearby and a son in [place].  Patient retired as a Pharmacist, hospital in [place].  ADVANCE DIRECTIVES:  Not on file  CODE STATUS:   PAST MEDICAL HISTORY: Past Medical History:  Diagnosis Date   Arthritis    COPD (chronic obstructive pulmonary disease) ([place])    Depression    History of methicillin resistant staphylococcus aureus (MRSA)    Hyperlipidemia    Lung mass    right lung   Osteoarthritis     PAST SURGICAL HISTORY:  Past Surgical History:  Procedure Laterality Date   CATARACT EXTRACTION W/ INTRAOCULAR LENS  IMPLANT, BILATERAL     CEREBRAL ANEURYSM REPAIR  01/2003   ELECTROMAGNETIC NAVIGATION BROCHOSCOPY Right 07/30/2018   Procedure: ELECTROMAGNETIC NAVIGATION BRONCHOSCOPY RIGHT;  Surgeon: Tyler Pita, MD;  Location: [facility] ORS;  Service: Cardiopulmonary;  Laterality: Right;   ENDOBRONCHIAL ULTRASOUND Right 07/30/2018   Procedure: ENDOBRONCHIAL ULTRASOUND RIGHT;  Surgeon: Tyler Pita, MD;  Location: [facility] ORS;  Service: Cardiopulmonary;  Laterality: Right;   retinal  detatchment Right 05/2017   TONSILLECTOMY     TOTAL HIP ARTHROPLASTY Right 01/2015   TOTAL HIP ARTHROPLASTY Left 05/2019   in [place]      HEMATOLOGY/ONCOLOGY HISTORY:  Oncology History  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  MEDICATIONS:  Current Outpatient Medications  Medication Sig Dispense Refill   buPROPion (WELLBUTRIN SR) 150 MG 12 hr tablet Take 150 mg by mouth 2 (two) times daily.      cetirizine (ZYRTEC) 10 MG tablet Take 10 mg by mouth daily.     chlorpheniramine-HYDROcodone (TUSSIONEX) 10-8 MG/5ML SUER Take 5 mLs by mouth every 12 (twelve) hours as needed for cough. 140 mL 0   EPINEPHrine 0.3 mg/0.3 mL IJ SOAJ injection Inject 0.3 mg into the muscle as needed for anaphylaxis.     ipratropium-albuterol (DUONEB) 0.5-2.5 (3) MG/3ML SOLN USE 1 VIAL  IN  NEBULIZER TWICE  DAILY 180 mL 11   meloxicam (MOBIC) 15 MG tablet Take 15 mg by mouth 3 (three) times daily.  (Patient not taking: No sig reported)     methocarbamol (ROBAXIN) 750 MG tablet Take 750 mg by mouth daily. (Patient not taking: No sig reported)  naproxen sodium (ALEVE) 220 MG tablet Take 440 mg by mouth every morning.      Omega-3 1000 MG CAPS Take 1,000 mg by mouth daily.      riboflavin (VITAMIN B-2) 100 MG TABS tablet Take 200 mg by mouth in the morning and at bedtime.  (Patient not taking: No sig reported)     rosuvastatin (CRESTOR) 5 MG tablet Take 1 tablet (5 mg total) by mouth daily. (Patient not taking: Reported on 09/12/2020) 90 tablet 1   vitamin B-12 (CYANOCOBALAMIN) 500 MCG tablet Take 500 mcg by mouth daily.     No current  facility-administered medications for this visit.    VITAL SIGNS: There were no vitals taken for this visit. There were no vitals filed for this visit.  Estimated body mass index is 19.2 kg/m as calculated from the following:   Height as of 08/19/20: 5\' 2"  (1.575 m).   Weight as of an earlier encounter on 09/12/20: 105 lb (47.6 kg).  LABS: CBC:    Component Value Date/Time   WBC 8.5 08/08/2020 0809   HGB 12.1 08/08/2020 0809   HCT 35.5 (L) 08/08/2020 0809   PLT 326 08/08/2020 0809   MCV 91.7 08/08/2020 0809   NEUTROABS 4.4 10/28/2019 1146   LYMPHSABS 2.4 10/28/2019 1146   MONOABS 0.6 10/28/2019 1146   EOSABS 0.2 10/28/2019 1146   BASOSABS 0.1 10/28/2019 1146   Comprehensive Metabolic Panel:    Component Value Date/Time   NA 141 10/28/2019 1146   K 3.7 10/28/2019 1146   CL 107 10/28/2019 1146   CO2 25 10/28/2019 1146   BUN 19 10/28/2019 1146   CREATININE 0.94 10/28/2019 1146   GLUCOSE 103 (H) 10/28/2019 1146   CALCIUM 8.9 10/28/2019 1146   AST 26 10/28/2019 1146   ALT 20 10/28/2019 1146   ALKPHOS 75 10/28/2019 1146   BILITOT 0.9 10/28/2019 1146   PROT 6.7 10/28/2019 1146   ALBUMIN 3.9 10/28/2019 1146    RADIOGRAPHIC STUDIES: MR Brain W Wo Contrast  Result Date: 08/31/2020 CLINICAL DATA:  Non-small cell lung cancer, monitor. EXAM: MRI HEAD WITHOUT AND WITH CONTRAST TECHNIQUE: Multiplanar, multiecho pulse sequences of the brain and surrounding structures were obtained without and with intravenous contrast. CONTRAST:  63mL GADAVIST GADOBUTROL 1 MMOL/ML IV SOLN COMPARISON:  Head CT April 05, 2020. FINDINGS: Brain: No acute infarction, hemorrhage, hydrocephalus, extra-axial collection or mass lesion. 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 Scattered and confluent foci of T2 hyperintensity are seen within the white matter of the cerebral hemispheres and within the pons, nonspecific, most likely related to  chronic small vessel ischemia. Susceptibility artifact lining the cerebral sulci in the bilateral parietooccipital region suggestive of superficial siderosis. Moderate parenchymal volume loss. No focus of abnormal contrast enhancement. Vascular: Normal flow voids. Skull and upper cervical spine: Postsurgical changes from right frontal craniotomy. Marrow signal characteristics are otherwise maintained. Soft tissues thickening posterior to the odontoid process may be degenerative versus CPPD. Sinuses/Orbits: Bilateral lens surgery. Paranasal sinuses are clear. IMPRESSION: 1. No focus of abnormal contrast enhancement to suggest intracranial metastatic disease. 2. 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 3. Moderate chronic microvascular ischemic changes of the white matter and parenchymal volume loss. 4. 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 Electronically Signed   By: Pedro Earls M.D.   On: 08/31/2020 13:09    PERFORMANCE STATUS (ECOG) : 2 - Symptomatic, <50%  confined to bed  Review of Systems Unless otherwise noted, a complete review of systems is negative.  Physical Exam General: NAD Pulmonary: Unlabored Extremities: no edema, no joint deformities Skin: no rashes Neurological: Weakness, has word searching  IMPRESSION: Routine follow-up visit.  Patient denies significant changes or concerns since last seen.  She says that her appetite is poor and she attributes that to a metallic taste in her mouth.  She has lost about 10 pounds over the past month.  Discussed importance of high-calorie/high-protein foods and encouraged starting oral nutritional supplements 2-3 times daily.  Will refer to nutritionist.  Patient says that she is leaning toward starting chemotherapy.  Will refer to home palliative care for support.  Patient says that she is still discussing decision-making with her family and has yet  to talk about the MOST form.  PLAN: -Continue current scope of treatment -Referral for nutrition -Referral for home palliative care -Patient to bring in ACP documents -MOST Form reviewed -RTC 3 to 4 weeks  Case and plan discussed with Dr. Janese Banks   Patient expressed understanding and was in agreement with this plan. She also understands that She can call the clinic at any time with any questions, concerns, or complaints.     Time Total: 15 minutes  Visit consisted of counseling and education dealing with the complex and emotionally intense issues of symptom management and palliative care in the setting of serious and potentially life-threatening illness.Greater than 50%  of this time was spent counseling and coordinating care related to the above assessment and plan.  Signed by: Altha Harm, PhD, NP-C

## 2020-09-12 NOTE — H&P (View-Only) (Signed)
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  N March 2020 and she underwent a bronchoscopy which did not reveal any malignancy.  Cytology was suspicious but not diagnostic for malignancy.  She then underwent SBRT for the same.  Subsequently in August 2021 she was found to have an endoluminal filling defect in the area of the right hilum.  She again underwent empiric radiation to this area which was hypermetabolic on PET CT scan measuring 2.4 cm.  She has not required any systemic chemotherapy so far    Recent surveillance CT scan in May 2022 showed concerning liver metastases and was followed by a PET scan.  PET scan showed multiple FDG avid metastases in the lateral segment of the left hepatic lobe measuring up to 3.9 cm.  No evidence of recurrent tumor was seen in the lung.  No other evidence of metastatic disease.     NGS testing showed PD-L1 of 95% but no other actionable mutations.Tumor positive for T p53 and NF P2 L2R34G  Interval history-patient is here with her son-in-law today.  No acute issues since last visit.  She continues to have generalized fatigue and word finding difficulty which has been unchanged for the last couple of months.  Denies any pain  ECOG PS- 2 Pain scale- 0   Review of systems- Review of Systems  Constitutional:  Positive for malaise/fatigue. Negative for chills, fever and weight loss.  HENT:  Negative for  congestion, ear discharge and nosebleeds.   Eyes:  Negative for blurred vision.  Respiratory:  Negative for cough, hemoptysis, sputum production, shortness of breath and wheezing.   Cardiovascular:  Negative for chest pain, palpitations, orthopnea and claudication.  Gastrointestinal:  Negative for abdominal pain, blood in stool, constipation, diarrhea, heartburn, melena, nausea and vomiting.  Genitourinary:  Negative for dysuria, flank pain, frequency, hematuria and urgency.  Musculoskeletal:  Negative for back pain, joint pain and myalgias.  Skin:  Negative for rash.  Neurological:  Negative for dizziness, tingling, focal weakness, seizures, weakness and headaches.  Endo/Heme/Allergies:  Does not bruise/bleed easily.  Psychiatric/Behavioral:  Negative for depression and suicidal ideas. The patient does not have insomnia.       Allergies  Allergen Reactions   Other Anaphylaxis    All nuts   Peanut Oil Anaphylaxis   Prochlorperazine Anaphylaxis     Past Medical History:  Diagnosis Date   Arthritis    COPD (chronic obstructive pulmonary disease) (HCC)    Depression    History of methicillin resistant staphylococcus aureus (MRSA)    Hyperlipidemia    Lung mass    right lung   Osteoarthritis      Past Surgical History:  Procedure Laterality Date   CATARACT EXTRACTION W/ INTRAOCULAR LENS  IMPLANT, BILATERAL     CEREBRAL ANEURYSM REPAIR  01/2003   ELECTROMAGNETIC NAVIGATION BROCHOSCOPY Right 07/30/2018   Procedure: ELECTROMAGNETIC NAVIGATION BRONCHOSCOPY RIGHT;  Surgeon: Tyler Pita, MD;  Location: [facility] ORS;  Service: Cardiopulmonary;  Laterality: Right;   ENDOBRONCHIAL ULTRASOUND Right 07/30/2018   Procedure: ENDOBRONCHIAL ULTRASOUND RIGHT;  Surgeon: Tyler Pita, MD;  Location: [facility] ORS;  Service: Cardiopulmonary;  Laterality: Right;   retinal detatchment Right 05/2017   TONSILLECTOMY     TOTAL HIP ARTHROPLASTY Right 01/2015   TOTAL HIP ARTHROPLASTY Left 05/2019    in [place] History   Socioeconomic History   Marital status: Widowed    Spouse name: Not on file   Number of children: 3   Years of education: Not on file   Highest education level: Not on file  Occupational History   Not on file  Tobacco Use   Smoking status: Every Day    Packs/day: 0.50    Years: 60.00    Pack years: 30.00    Types: Cigarettes   Smokeless tobacco: Never   Tobacco comments:    about 5-10 cigarettes a day-11/18/2019  Vaping Use   Vaping Use: Never used  Substance and Sexual Activity   Alcohol use: Not Currently    Comment: once in a great while   Drug use: Never   Sexual activity: Not Currently  Other Topics Concern   Not on file  Social History Narrative   Lives by herself with a Risk analyst    Social Determinants of Health   Financial Resource Strain: Not on file  Food Insecurity: Not on file  Transportation Needs: Not on file  Physical Activity: Not on file  Stress: Not on file  Social Connections: Not on file  Intimate Partner Violence: Not on file    Family History  Problem Relation Age of Onset   Heart attack Mother    Cancer Maternal Grandmother 80       spine ca   Heart attack Maternal Grandfather      Current Outpatient Medications:    buPROPion (WELLBUTRIN SR) 150 MG 12 hr tablet, Take 150 mg by mouth 2 (two) times daily. , Disp: , Rfl:    cetirizine (ZYRTEC) 10 MG tablet, Take 10 mg by mouth daily., Disp: , Rfl:    chlorpheniramine-HYDROcodone (TUSSIONEX) 10-8 MG/5ML SUER, Take 5 mLs by mouth every 12 (twelve) hours as needed for cough., Disp: 140 mL, Rfl: 0   EPINEPHrine 0.3 mg/0.3 mL IJ SOAJ injection, Inject 0.3 mg into the muscle as needed for anaphylaxis., Disp: , Rfl:    ipratropium-albuterol (DUONEB) 0.5-2.5 (3) MG/3ML SOLN, USE 1 VIAL  IN  NEBULIZER TWICE  DAILY, Disp: 180 mL, Rfl: 11   naproxen sodium (ALEVE) 220 MG tablet, Take 440 mg by mouth every morning. , Disp: , Rfl:    Omega-3  1000 MG CAPS, Take 1,000 mg by mouth daily. , Disp: , Rfl:    vitamin B-12 (CYANOCOBALAMIN) 500 MCG tablet, Take 500 mcg by mouth daily., Disp: , Rfl:    meloxicam (MOBIC) 15 MG tablet, Take 15 mg by mouth 3 (three) times daily.  (Patient not taking: No sig reported), Disp: , Rfl:    methocarbamol (ROBAXIN) 750 MG tablet, Take 750 mg by mouth daily. (Patient not taking: No sig reported), Disp: , Rfl:    riboflavin (VITAMIN B-2) 100 MG TABS tablet, Take 200 mg by mouth in the morning and at bedtime.  (Patient not taking: No sig reported), Disp: , Rfl:    rosuvastatin (CRESTOR) 5 MG tablet, Take 1 tablet (5 mg total) by mouth daily. (Patient not taking: Reported on 09/12/2020), Disp: 90 tablet, Rfl: 1  Physical exam:  Vitals:   09/12/20 1014  BP: 117/67  Pulse: (!) 114  Resp: 17  Temp: 98 F (36.7 C)  TempSrc: Tympanic  SpO2: 98%  Weight: 105 lb (47.6 kg)   Physical Exam Constitutional:      Comments: She is elderly and somewhat frail.  Sitting in a wheelchair appears in no acute distress  Cardiovascular:     Rate and Rhythm: Normal rate and regular rhythm.     Heart sounds: Normal heart sounds.  Pulmonary:     Effort: Pulmonary effort is normal.     Breath sounds: Normal breath sounds.  Abdominal:     General: Bowel sounds are normal.     Palpations: Abdomen is soft.  Skin:    General: Skin is warm and dry.  Neurological:     Mental Status: She is alert and oriented to person, place, and time.     CMP Latest Ref Rng & Units 10/28/2019  Glucose 70 - 99 mg/dL 103(H)  BUN 8 - 23 mg/dL 19  Creatinine 0.44 - 1.00 mg/dL 0.94  Sodium 135 - 145 mmol/L 141  Potassium 3.5 - 5.1 mmol/L 3.7  Chloride 98 - 111 mmol/L 107  CO2 22 - 32 mmol/L 25  Calcium 8.9 - 10.3 mg/dL 8.9  Total Protein 6.5 - 8.1 g/dL 6.7  Total Bilirubin 0.3 - 1.2 mg/dL 0.9  Alkaline Phos 38 - 126 U/L 75  AST 15 - 41 U/L 26  ALT 0 - 44 U/L 20   CBC Latest Ref Rng & Units 08/08/2020  WBC 4.0 - 10.5 K/uL 8.5   Hemoglobin 12.0 - 15.0 g/dL 12.1  Hematocrit 36.0 - 46.0 % 35.5(L)  Platelets 150 - 400 K/uL 326    No images are attached to the encounter.  MR Brain W Wo Contrast  Result Date: 08/31/2020 CLINICAL DATA:  Non-small cell lung cancer, monitor. EXAM: MRI HEAD WITHOUT AND WITH CONTRAST TECHNIQUE: Multiplanar, multiecho pulse sequences of the brain and surrounding structures were obtained without and with intravenous contrast. CONTRAST:  32mL GADAVIST GADOBUTROL 1 MMOL/ML IV SOLN COMPARISON:  Head CT April 05, 2020. FINDINGS: Brain: No acute infarction, hemorrhage, hydrocephalus, extra-axial collection or mass lesion. 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 Scattered and confluent foci of T2 hyperintensity are seen within the white matter of the cerebral hemispheres and within the pons, nonspecific, most likely related to chronic small vessel ischemia. Susceptibility artifact lining the cerebral sulci in the bilateral parietooccipital region suggestive of superficial siderosis. Moderate parenchymal volume loss. No focus of abnormal contrast enhancement. Vascular: Normal flow voids. Skull and upper cervical spine: Postsurgical changes from right frontal craniotomy. Marrow signal characteristics are otherwise maintained. Soft tissues thickening posterior to the odontoid process may be degenerative versus CPPD. Sinuses/Orbits: Bilateral lens surgery. Paranasal sinuses are clear. IMPRESSION: 1. No focus of abnormal contrast enhancement to suggest intracranial metastatic disease. 2. 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 3. Moderate chronic microvascular ischemic changes of the white matter and parenchymal volume loss. 4. 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 Electronically Signed   By: Pedro Earls M.D.   On: 08/31/2020 13:09      Assessment and plan- Patient is a 80 y.o. female with prior history of lung cancer now with biopsy-proven squamous cell carcinoma and liver metastases here to discuss NGS testing and further management  Discussed with the patient and NGS  testing did not show any actionable mutation.  PD-L1 was greater than 95%.  Options at this time include combination chemoimmunotherapy with [facility] given IV every 3 weeks for 4 cycles followed by [facility] alone every 3 weeks until progression or toxicity.  Discussed risks and benefits of chemotherapy including all but not limited to nausea, vomiting, low blood counts, risk of infections and hospitalization.  Risk of peripheral neuropathy and loss and infusion reaction associated with Taxol.  Treatment will be given with a palliative intent.  Patient does not desire to pursue chemotherapy at all then the next best option would be single agent immunotherapy with Keytruda every 3 weeks until progression or toxicity.  Patient is leaning towards option 1 but would like to discuss with her family members and get back to [place] in a day or so.  Will need port placement and chemo teach prior to starting treatment.  If patient agrees to proceed with treatment I will tentatively see her back in a week to 10 days to start cycle 1 of either chemoimmunotherapy or immunotherapy alone based on what she decides  Patient will also be seen by palliative care later today and will be referred for home palliative care and nutrition counseling.  Treatment will be given with a palliative intent     Visit Diagnosis 1. Goals of care, counseling/discussion   2. 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 Liver metastases ([place])      Dr. Randa Evens, MD, MPH [facility] [phone] 09/12/2020 5:06 PM

## 2020-09-12 NOTE — Progress Notes (Signed)
Patient here for oncology follow-up appointment, expresses concerns of N/V/D and lightheaded

## 2020-09-13 ENCOUNTER — Telehealth (INDEPENDENT_AMBULATORY_CARE_PROVIDER_SITE_OTHER): Payer: Self-pay

## 2020-09-13 ENCOUNTER — Encounter: Payer: Self-pay | Admitting: Oncology

## 2020-09-13 ENCOUNTER — Telehealth: Payer: Self-pay | Admitting: Student

## 2020-09-13 ENCOUNTER — Telehealth: Payer: Self-pay | Admitting: *Deleted

## 2020-09-13 DIAGNOSIS — C3490 Malignant neoplasm of unspecified part of unspecified bronchus or lung: Secondary | ICD-10-CM

## 2020-09-13 NOTE — Telephone Encounter (Signed)
Patient returned my call and is scheduled with Dr. Lucky Cowboy for a port placement on 09/16/20 with a 11:00 am arrival time to the MM. Pre-procedure instructions were discussed and patient understood.

## 2020-09-13 NOTE — Telephone Encounter (Signed)
Pt called in to state that she spoke with her family and has decided she wants to pursue conbination chemo-immunotherapy with [place], taxol, [place]. Informed pt that will get her scheduled for chemo edu, port placement, and follow up appt with Dr. Janese Banks to start chemo treatments. Referral placed to [facility] to place port hopefully this week since will possibly start chemo next week. Pt informed that she will be called with appts. Instructed to call with any questions. Pt verbalized understanding.

## 2020-09-13 NOTE — Telephone Encounter (Signed)
I attempted to contact the patient to schedule her for a port placement and a message was left on her mobile and home lines for a return call.

## 2020-09-13 NOTE — Telephone Encounter (Signed)
Spoke with patient regarding the Palliative referral/services and all questions were answered and she was in agreement with beginning services with [place].  I have scheduled an In-home Consult for 09/15/20 @ 12:30 PM

## 2020-09-14 ENCOUNTER — Encounter: Payer: Self-pay | Admitting: *Deleted

## 2020-09-15 ENCOUNTER — Telehealth: Payer: Self-pay | Admitting: Oncology

## 2020-09-15 ENCOUNTER — Other Ambulatory Visit: Payer: Medicare Other | Admitting: Student

## 2020-09-15 ENCOUNTER — Other Ambulatory Visit: Payer: Self-pay | Admitting: Oncology

## 2020-09-15 ENCOUNTER — Inpatient Hospital Stay: Payer: Medicare Other

## 2020-09-15 ENCOUNTER — Other Ambulatory Visit: Payer: Self-pay

## 2020-09-15 DIAGNOSIS — R630 Anorexia: Secondary | ICD-10-CM

## 2020-09-15 DIAGNOSIS — C3491 Malignant neoplasm of unspecified part of right bronchus or lung: Secondary | ICD-10-CM

## 2020-09-15 DIAGNOSIS — K5900 Constipation, unspecified: Secondary | ICD-10-CM | POA: Diagnosis not present

## 2020-09-15 DIAGNOSIS — Z515 Encounter for palliative care: Secondary | ICD-10-CM | POA: Diagnosis not present

## 2020-09-15 MED ORDER — ONDANSETRON HCL 8 MG PO TABS: 8.0000 mg | ORAL_TABLET | Freq: Two times a day (BID) | ORAL | 1 refills | Status: DC | PRN

## 2020-09-15 MED ORDER — DEXAMETHASONE 4 MG PO TABS: 8.0000 mg | ORAL_TABLET | Freq: Every day | ORAL | 1 refills | Status: DC

## 2020-09-15 MED ORDER — PROCHLORPERAZINE MALEATE 10 MG PO TABS: 10.0000 mg | ORAL_TABLET | Freq: Four times a day (QID) | ORAL | 1 refills | Status: DC | PRN

## 2020-09-15 MED ORDER — LORAZEPAM 0.5 MG PO TABS: 0.5000 mg | ORAL_TABLET | Freq: Four times a day (QID) | ORAL | 0 refills | Status: DC | PRN

## 2020-09-15 MED ORDER — LIDOCAINE-PRILOCAINE 2.5-2.5 % EX CREA: TOPICAL_CREAM | CUTANEOUS | 3 refills | Status: DC

## 2020-09-15 NOTE — Progress Notes (Signed)
[place] Consult Note Telephone: [phone]  Fax: [phone]   Date of encounter: 09/15/20 PATIENT NAME: Latoya Lynch [id number]   [phone] (home)  DOB: [birth year] MRN: [id number] PRIMARY CARE PROVIDER:    Lavera Guise, MD,  [place] [address] [phone]  REFERRING PROVIDER:   Lavera Guise, [place],   [address] [phone]  RESPONSIBLE PARTY:    Contact Information     Name Relation Home Work Mobile   Mickle Asper Daughter   [phone]   Berton Bon Other   [phone]        I met face to face with patient and family in the home. Palliative Care was asked to follow this patient by consultation request of  Dr. Janese Banks to address advance care planning and complex medical decision making. This is the initial visit.                                     ASSESSMENT AND PLAN / RECOMMENDATIONS:   Advance Care Planning/Goals of Care: Goals include to maximize quality of life and symptom management. Our advance care planning conversation included a discussion about:    The value and importance of advance care planning  Experiences with loved ones who have been seriously ill or have died  Exploration of personal, cultural or spiritual beliefs that might influence medical decisions  Exploration of goals of care in the event of a sudden injury or illness  Identification and preparation of a healthcare agent  Reviewed MOST form; family to discuss further CODE STATUS: Full Code  Education provided on Palliative Medicine vs. Hospice services.   Symptom Management/Plan:  Squamous cell carcinoma of lung with metastasis to liver-patient to start combination chemo immunotherapy. Port a cath placement tomorrow. Will monitor for symptoms; provide symptom management as needs arise. Follow up with oncology as  scheduled.  Constipation-Recommend increasing miralax to BID, add fiber to diet, drink adequate fluids.   Appetite-Recommend eating foods she enjoys; encourage snacks throughout the day. Add nutritional supplement; encourage patient to drink supplement if she is eating 50% or less of a meal. Has upcoming appointment with nutritionist. Discussed program such as Meals on Wheels.  SW referral for life alert and Meals on Wheels.   Follow up Palliative Care Visit: Palliative care will continue to follow for complex medical decision making, advance care planning, and clarification of goals. Return in 4 weeks or prn.  I spent 75 minutes providing this consultation. More than 50% of the time in this consultation was spent in counseling and care coordination.    PPS: 60%  HOSPICE ELIGIBILITY/DIAGNOSIS: TBD  Chief Complaint: Palliative Medicine initial consult  HISTORY OF PRESENT ILLNESS:  Latoya Lynch is a 80 y.o. year old female  with stage IV lung cancer, with metastasis to liver; status post SBRT. Patient has decided on immuno/chemotherapy combination. She is to have port a cath placed tomorrow; treatments start next week.   Patient resides at home; has good support from daughter and son-in-law. She endorses fatigue. She reports occasional right side pain; takes acetaminophen with relief. She endorses decline in appetite. She reports a 5 pound weight loss in past month. Denies nausea, occasional constipation. Taking miralax daily. She does endorse shortness of breath with exertion. Sleeping well at night. Using over the counter sleep aid. Still able to  drive, but has not been driving recently. Patient states she has a Living Will, but needs to locate. She is in process of assigning a HCPOA.    History obtained from review of EMR, discussion with primary team, and interview with family, facility staff/caregiver and/or Ms. Michaud-Towery.  I reviewed available labs, medications,  imaging, studies and related documents from the EMR.  Records reviewed and summarized above.   ROS  General: NAD EYES: denies vision changes ENMT: denies dysphagia Cardiovascular: denies chest pain Pulmonary: occasional cough,  SOB with exertion Abdomen: endorses poor to fair appetite, endorses continence of bowel GU: denies dysuria, MSK:  weakness, falls in past week Skin: denies rashes or wounds Neurological: denies pain Psych: Endorses positive mood Heme/lymph/immuno: denies bruises, abnormal bleeding  Physical Exam: Current and past weights: 105 pounds Pulse 108, resp 16, b/p 112/68, sats 93% on room air Constitutional: NAD General: frail appearing, thin EYES: anicteric sclera, lids intact, no discharge  ENMT: intact hearing, oral mucous membranes moist, dentition intact CV: S1S2, RRR, no LE edema Pulmonary: LCTA, no increased work of breathing, no cough, room air Abdomen: , normo-active BS + 4 quadrants, soft and non tender GU: deferred MSK: sarcopenia, moves all extremities, ambulatory with cane Skin: warm and dry, no rashes or wounds on visible skin Neuro: generalized weakness, A & O x 3, forgetful, trouble with word finding Psych: non-anxious affect, pleasant Hem/lymph/immuno: no widespread bruising CURRENT PROBLEM LIST:  Patient Active Problem List   Diagnosis Date Noted   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, right ([place]) 08/19/2020   Goals of care, counseling/discussion 08/19/2020   Osteoarthritis of left hip 07/26/2020   Bilateral cataracts 07/26/2020   Disorder of lower respiratory system 07/26/2020   History of surgery 07/26/2020   Intracranial aneurysm 07/26/2020   Moderate aortic valve regurgitation 07/26/2020   History of total hip arthroplasty 06/02/2019   Abnormal gait 03/30/2019   Hip pain 03/30/2019   Chronic obstructive lung disease ([place]) 03/27/2018   Tobacco dependence due to cigarettes 03/27/2018   Mass of lower lobe of right lung 12/19/2017    Retinal detachment 05/16/2017   PAST MEDICAL HISTORY:  Active Ambulatory Problems    Diagnosis Date Noted   Mass of lower lobe of right lung 12/19/2017   Retinal detachment 05/16/2017   Chronic obstructive lung disease ([place]) 03/27/2018   Tobacco dependence due to cigarettes 03/27/2018   Abnormal gait 03/30/2019   Hip pain 03/30/2019   Osteoarthritis of left hip 07/26/2020   Bilateral cataracts 07/26/2020   Disorder of lower respiratory system 07/26/2020   History of surgery 07/26/2020   History of total hip arthroplasty 06/02/2019   Intracranial aneurysm 07/26/2020   Moderate aortic valve regurgitation 07/26/2020   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, right ([place]) 08/19/2020   Goals of care, counseling/discussion 08/19/2020   Resolved Ambulatory Problems    Diagnosis Date Noted   No Resolved Ambulatory Problems   Past Medical History:  Diagnosis Date   Arthritis    COPD (chronic obstructive pulmonary disease) (HCC)    Depression    History of methicillin resistant staphylococcus aureus (MRSA)    Hyperlipidemia    Lung mass    Osteoarthritis    SOCIAL HX:  Social History   Tobacco Use   Smoking status: Every Day    Packs/day: 0.50    Years: 60.00    Pack years: 30.00    Types: Cigarettes   Smokeless tobacco: Never   Tobacco comments:    about  5-10 cigarettes a day-11/18/2019  Substance Use Topics   Alcohol use: Not Currently    Comment: once in a great while   FAMILY HX:  Family History  Problem Relation Age of Onset   Heart attack Mother    Cancer Maternal Grandmother 77       spine ca   Heart attack Maternal Grandfather       ALLERGIES:  Allergies  Allergen Reactions   Other Anaphylaxis    All nuts   Peanut Oil Anaphylaxis   Prochlorperazine Anaphylaxis     PERTINENT MEDICATIONS:  Outpatient Encounter Medications as of 09/15/2020  Medication Sig   buPROPion (WELLBUTRIN SR) 150 MG 12 hr tablet Take 150 mg by mouth 2 (two) times daily.     cetirizine (ZYRTEC) 10 MG tablet Take 10 mg by mouth daily.   chlorpheniramine-HYDROcodone (TUSSIONEX) 10-8 MG/5ML SUER Take 5 mLs by mouth every 12 (twelve) hours as needed for cough.   EPINEPHrine 0.3 mg/0.3 mL IJ SOAJ injection Inject 0.3 mg into the muscle as needed for anaphylaxis.   ipratropium-albuterol (DUONEB) 0.5-2.5 (3) MG/3ML SOLN USE 1 VIAL  IN  NEBULIZER TWICE  DAILY   meloxicam (MOBIC) 15 MG tablet Take 15 mg by mouth 3 (three) times daily.  (Patient not taking: No sig reported)   methocarbamol (ROBAXIN) 750 MG tablet Take 750 mg by mouth daily. (Patient not taking: No sig reported)   naproxen sodium (ALEVE) 220 MG tablet Take 440 mg by mouth every morning.    Omega-3 1000 MG CAPS Take 1,000 mg by mouth daily.    prochlorperazine (COMPAZINE) 10 MG tablet Take 1 tablet (10 mg total) by mouth every 6 (six) hours as needed (Nausea or vomiting).   riboflavin (VITAMIN B-2) 100 MG TABS tablet Take 200 mg by mouth in the morning and at bedtime.  (Patient not taking: No sig reported)   rosuvastatin (CRESTOR) 5 MG tablet Take 1 tablet (5 mg total) by mouth daily. (Patient not taking: Reported on 09/12/2020)   vitamin B-12 (CYANOCOBALAMIN) 500 MCG tablet Take 500 mcg by mouth daily.   No facility-administered encounter medications on file as of 09/15/2020.   Thank you for the opportunity to participate in the care of Ms. Michaud-Towery.  The palliative care team will continue to follow. Please call our office at [phone] if we can be of additional assistance.   Ezekiel Slocumb, NP   COVID-19 PATIENT SCREENING TOOL Asked and negative response unless otherwise noted:  Have you had symptoms of covid, tested positive or been in contact with someone with symptoms/positive test in the past 5-10 days? No

## 2020-09-15 NOTE — Telephone Encounter (Signed)
Left VM with patient just to confirm upcoming appt-no need to return call unless questions or concerns.

## 2020-09-15 NOTE — Progress Notes (Signed)
START ON PATHWAY REGIMEN - Non-Small Cell Lung     A cycle is every 21 days:     Pembrolizumab      Paclitaxel      Carboplatin   **Always confirm dose/schedule in your pharmacy ordering system**  Patient Characteristics: Stage IV Metastatic, Squamous, Molecular Analysis Completed, Alteration Present and Targeted Therapy Exhausted or EGFR Exon 20 Insertion or KRAS G12C Present, and No Prior Chemo/Immunotherapy or No Alteration Present, PS = 0, 1, Initial  Chemotherapy/Immunotherapy, Candidate for Immunotherapy, PD-L1 Expression Positive ? 50% (TPS) and Immunotherapy Candidate Therapeutic Status: Stage IV Metastatic Histology: Squamous Cell Molecular Analysis Results: No Alteration Present ECOG Performance Status: 1 Chemotherapy/Immunotherapy Line of Therapy: Initial Chemotherapy/Immunotherapy Immunotherapy Candidate Status: Candidate for Immunotherapy PD-L1 Expression Status: PD-L1 Positive ? 50% (TPS) Intent of Therapy: Non-Curative / Palliative Intent, Discussed with Patient

## 2020-09-16 ENCOUNTER — Other Ambulatory Visit: Payer: Self-pay

## 2020-09-16 ENCOUNTER — Encounter: Payer: Self-pay | Admitting: Vascular Surgery

## 2020-09-16 ENCOUNTER — Other Ambulatory Visit (INDEPENDENT_AMBULATORY_CARE_PROVIDER_SITE_OTHER): Payer: Self-pay | Admitting: Nurse Practitioner

## 2020-09-16 ENCOUNTER — Encounter
Admission: RE | Disposition: A | Discharge status: Home / Self Care | Payer: Self-pay | Source: Home / Self Care | Attending: Vascular Surgery

## 2020-09-16 ENCOUNTER — Ambulatory Visit
Admission: RE | Admit: 2020-09-16 | Discharge: 2020-09-16 | Disposition: A | Discharge status: Home / Self Care | Payer: Medicare Other | Attending: Vascular Surgery | Admitting: Vascular Surgery

## 2020-09-16 DIAGNOSIS — J449 Chronic obstructive pulmonary disease, unspecified: Secondary | ICD-10-CM | POA: Diagnosis not present

## 2020-09-16 DIAGNOSIS — C3490 Malignant neoplasm of unspecified part of unspecified bronchus or lung: Secondary | ICD-10-CM

## 2020-09-16 DIAGNOSIS — F17210 Nicotine dependence, cigarettes, uncomplicated: Secondary | ICD-10-CM | POA: Diagnosis not present

## 2020-09-16 DIAGNOSIS — Z888 Allergy status to other drugs, medicaments and biological substances status: Secondary | ICD-10-CM | POA: Diagnosis not present

## 2020-09-16 DIAGNOSIS — Z91010 Allergy to peanuts: Secondary | ICD-10-CM | POA: Insufficient documentation

## 2020-09-16 DIAGNOSIS — C787 Secondary malignant neoplasm of liver and intrahepatic bile duct: Secondary | ICD-10-CM | POA: Diagnosis not present

## 2020-09-16 DIAGNOSIS — C3491 Malignant neoplasm of unspecified part of right bronchus or lung: Secondary | ICD-10-CM | POA: Insufficient documentation

## 2020-09-16 DIAGNOSIS — Z79899 Other long term (current) drug therapy: Secondary | ICD-10-CM | POA: Diagnosis not present

## 2020-09-16 HISTORY — PX: PORTA CATH INSERTION: CATH118285

## 2020-09-16 SURGERY — PORTA CATH INSERTION
Anesthesia: Moderate Sedation

## 2020-09-16 MED ORDER — DIPHENHYDRAMINE HCL 50 MG/ML IJ SOLN: 50.0000 mg | Freq: Once | INTRAMUSCULAR | Status: DC | PRN

## 2020-09-16 MED ORDER — FENTANYL CITRATE (PF) 100 MCG/2ML IJ SOLN
INTRAMUSCULAR | Status: DC | PRN
  Administered 2020-09-16 (×2): 25 ug via INTRAVENOUS

## 2020-09-16 MED ORDER — MIDAZOLAM HCL 2 MG/2ML IJ SOLN
INTRAMUSCULAR | Status: AC
  Filled 2020-09-16: qty 2

## 2020-09-16 MED ORDER — CEFAZOLIN SODIUM-DEXTROSE 2-4 GM/100ML-% IV SOLN
INTRAVENOUS | Status: AC
  Administered 2020-09-16: 2 g via INTRAVENOUS
  Filled 2020-09-16: qty 100

## 2020-09-16 MED ORDER — FENTANYL CITRATE (PF) 100 MCG/2ML IJ SOLN
INTRAMUSCULAR | Status: AC
  Filled 2020-09-16: qty 2

## 2020-09-16 MED ORDER — CHLORHEXIDINE GLUCONATE CLOTH 2 % EX PADS
6.0000 | MEDICATED_PAD | Freq: Every day | CUTANEOUS | Status: DC
  Administered 2020-09-16: 6 via TOPICAL

## 2020-09-16 MED ORDER — MIDAZOLAM HCL 2 MG/ML PO SYRP: 8.0000 mg | ORAL_SOLUTION | Freq: Once | ORAL | Status: DC | PRN

## 2020-09-16 MED ORDER — METHYLPREDNISOLONE SODIUM SUCC 125 MG IJ SOLR: 125.0000 mg | Freq: Once | INTRAMUSCULAR | Status: DC | PRN

## 2020-09-16 MED ORDER — CEFAZOLIN SODIUM-DEXTROSE 2-4 GM/100ML-% IV SOLN: 2.0000 g | Freq: Once | INTRAVENOUS | Status: AC

## 2020-09-16 MED ORDER — SODIUM CHLORIDE 0.9 % IV SOLN
Freq: Once | INTRAVENOUS | Status: DC
  Filled 2020-09-16: qty 2

## 2020-09-16 MED ORDER — HYDROMORPHONE HCL 1 MG/ML IJ SOLN: 1.0000 mg | Freq: Once | INTRAMUSCULAR | Status: DC | PRN

## 2020-09-16 MED ORDER — SODIUM CHLORIDE 0.9 % IV SOLN: INTRAVENOUS | Status: DC

## 2020-09-16 MED ORDER — MIDAZOLAM HCL 2 MG/2ML IJ SOLN
INTRAMUSCULAR | Status: DC | PRN
  Administered 2020-09-16 (×2): 1 mg via INTRAVENOUS

## 2020-09-16 MED ORDER — FAMOTIDINE 20 MG PO TABS: 40.0000 mg | ORAL_TABLET | Freq: Once | ORAL | Status: DC | PRN

## 2020-09-16 MED ORDER — ONDANSETRON HCL 4 MG/2ML IJ SOLN: 4.0000 mg | Freq: Four times a day (QID) | INTRAMUSCULAR | Status: DC | PRN

## 2020-09-16 SURGICAL SUPPLY — 12 items
COVER PROBE U/S 5X48 (MISCELLANEOUS) ×2 IMPLANT
DERMABOND ADVANCED (GAUZE/BANDAGES/DRESSINGS) ×1
DERMABOND ADVANCED .7 DNX12 (GAUZE/BANDAGES/DRESSINGS) ×1 IMPLANT
HANDLE YANKAUER SUCT BULB TIP (MISCELLANEOUS) ×2 IMPLANT
KIT PORT POWER 8FR ISP CVUE (Port) ×2 IMPLANT
PACK ANGIOGRAPHY (CUSTOM PROCEDURE TRAY) ×2 IMPLANT
PENCIL ELECTRO HAND CTR (MISCELLANEOUS) ×2 IMPLANT
SPONGE XRAY 4X4 16PLY STRL (MISCELLANEOUS) ×2 IMPLANT
SUT MNCRL AB 4-0 PS2 18 (SUTURE) ×2 IMPLANT
SUT VIC AB 3-0 SH 27 (SUTURE) ×1
SUT VIC AB 3-0 SH 27X BRD (SUTURE) ×1 IMPLANT
TUBING CONNECTING 10 (TUBING) ×4 IMPLANT

## 2020-09-16 NOTE — Interval H&P Note (Signed)
History and Physical Interval Note:  09/16/2020 11:51 AM  Latoya Lynch  has presented today for surgery, with the diagnosis of Porta Cath Placement   Lung Ca.  The various methods of treatment have been discussed with the patient and family. After consideration of risks, benefits and other options for treatment, the patient has consented to  Procedure(s): PORTA CATH INSERTION (N/A) as a surgical intervention.  The patient's history has been reviewed, patient examined, no change in status, stable for surgery.  I have reviewed the patient's chart and labs.  Questions were answered to the patient's satisfaction.     Leotis Pain

## 2020-09-16 NOTE — Op Note (Signed)
      [facility]       Operative Note  Date: 09/16/2020  Preoperative diagnosis:  1. Lung cancer  Postoperative diagnosis:  Same as above  Procedures: #1. Ultrasound guidance for vascular access to the right internal jugular vein. #2. Fluoroscopic guidance for placement of catheter. #3. Placement of CT compatible Port-A-Cath, right internal jugular vein.  Surgeon: Leotis Pain, MD.   Anesthesia: Local with moderate conscious sedation for approximately 18  minutes using 2 mg of Versed and 50 mcg of Fentanyl  Fluoroscopy time: less than 1 minute  Contrast used: 0  Estimated blood loss: 3 cc  Indication for the procedure:  The patient is a 80 y.o.female with lung cancer.  The patient needs a Port-A-Cath for durable venous access, chemotherapy, lab draws, and CT scans. We are asked to place this. Risks and benefits were discussed and informed consent was obtained.  Description of procedure: The patient was brought to the vascular and interventional radiology suite.  Moderate conscious sedation was administered throughout the procedure during a face to face encounter with the patient with my supervision of the RN administering medicines and monitoring the patient's vital signs, pulse oximetry, telemetry and mental status throughout from the start of the procedure until the patient was taken to the recovery room. The right neck chest and shoulder were sterilely prepped and draped, and a sterile surgical field was created. Ultrasound was used to help visualize a patent right internal jugular vein. This was then accessed under direct ultrasound guidance without difficulty with the Seldinger needle and a permanent image was recorded. A J-wire was placed. After skin nick and dilatation, the peel-away sheath was then placed over the wire. I then anesthetized an area under the clavicle approximately 1-2 fingerbreadths. A transverse incision was created and an inferior pocket was  created with electrocautery and blunt dissection. The port was then brought onto the field, placed into the pocket and secured to the chest wall with 2 Prolene sutures. The catheter was connected to the port and tunneled from the subclavicular incision to the access site. Fluoroscopic guidance was then used to cut the catheter to an appropriate length. The catheter was then placed through the peel-away sheath and the peel-away sheath was removed. The catheter tip was parked in excellent location under fluorocoscopic guidance in the cavoatrial junction. The pocket was then irrigated with antibiotic impregnated saline and the wound was closed with a running 3-0 Vicryl and a 4-0 Monocryl. The access incision was closed with a single 4-0 Monocryl. The Huber needle was used to withdraw blood and flush the port with heparinized saline. Dermabond was then placed as a dressing. The patient tolerated the procedure well and was taken to the recovery room in stable condition.   Leotis Pain 09/16/2020 1:30 PM   This note was created with Dragon Medical transcription system. Any errors in dictation are purely unintentional.

## 2020-09-19 ENCOUNTER — Encounter: Payer: Self-pay | Admitting: Vascular Surgery

## 2020-09-19 ENCOUNTER — Inpatient Hospital Stay: Payer: Medicare Other

## 2020-09-19 ENCOUNTER — Other Ambulatory Visit: Payer: Medicare Other

## 2020-09-19 ENCOUNTER — Other Ambulatory Visit: Payer: Self-pay

## 2020-09-19 ENCOUNTER — Ambulatory Visit: Payer: Medicare Other | Admitting: Oncology

## 2020-09-20 ENCOUNTER — Inpatient Hospital Stay (HOSPITAL_BASED_OUTPATIENT_CLINIC_OR_DEPARTMENT_OTHER): Payer: Medicare Other | Admitting: Oncology

## 2020-09-20 ENCOUNTER — Inpatient Hospital Stay: Payer: Medicare Other

## 2020-09-20 ENCOUNTER — Inpatient Hospital Stay (HOSPITAL_BASED_OUTPATIENT_CLINIC_OR_DEPARTMENT_OTHER): Payer: Medicare Other | Admitting: Hospice and Palliative Medicine

## 2020-09-20 ENCOUNTER — Telehealth: Payer: Self-pay

## 2020-09-20 ENCOUNTER — Encounter: Payer: Self-pay | Admitting: Oncology

## 2020-09-20 ENCOUNTER — Other Ambulatory Visit: Payer: Self-pay | Admitting: *Deleted

## 2020-09-20 VITALS — BP 123/97 | HR 107 | Temp 96.9°F | Resp 18

## 2020-09-20 VITALS — BP 113/67 | HR 110 | Temp 98.1°F | Resp 16 | Ht 62.0 in | Wt 107.2 lb

## 2020-09-20 DIAGNOSIS — E860 Dehydration: Secondary | ICD-10-CM | POA: Diagnosis not present

## 2020-09-20 DIAGNOSIS — C3491 Malignant neoplasm of unspecified part of right bronchus or lung: Secondary | ICD-10-CM

## 2020-09-20 DIAGNOSIS — Z7952 Long term (current) use of systemic steroids: Secondary | ICD-10-CM | POA: Diagnosis not present

## 2020-09-20 DIAGNOSIS — Z515 Encounter for palliative care: Secondary | ICD-10-CM | POA: Diagnosis not present

## 2020-09-20 DIAGNOSIS — Z79899 Other long term (current) drug therapy: Secondary | ICD-10-CM | POA: Diagnosis not present

## 2020-09-20 DIAGNOSIS — Z5112 Encounter for antineoplastic immunotherapy: Secondary | ICD-10-CM

## 2020-09-20 DIAGNOSIS — Z5189 Encounter for other specified aftercare: Secondary | ICD-10-CM | POA: Diagnosis not present

## 2020-09-20 DIAGNOSIS — Z5111 Encounter for antineoplastic chemotherapy: Secondary | ICD-10-CM

## 2020-09-20 DIAGNOSIS — Z7189 Other specified counseling: Secondary | ICD-10-CM

## 2020-09-20 DIAGNOSIS — Z95828 Presence of other vascular implants and grafts: Secondary | ICD-10-CM

## 2020-09-20 DIAGNOSIS — E876 Hypokalemia: Secondary | ICD-10-CM

## 2020-09-20 DIAGNOSIS — E871 Hypo-osmolality and hyponatremia: Secondary | ICD-10-CM | POA: Diagnosis not present

## 2020-09-20 DIAGNOSIS — J449 Chronic obstructive pulmonary disease, unspecified: Secondary | ICD-10-CM | POA: Diagnosis not present

## 2020-09-20 DIAGNOSIS — F17210 Nicotine dependence, cigarettes, uncomplicated: Secondary | ICD-10-CM | POA: Diagnosis not present

## 2020-09-20 DIAGNOSIS — C787 Secondary malignant neoplasm of liver and intrahepatic bile duct: Secondary | ICD-10-CM | POA: Diagnosis not present

## 2020-09-20 DIAGNOSIS — D649 Anemia, unspecified: Secondary | ICD-10-CM | POA: Diagnosis not present

## 2020-09-20 DIAGNOSIS — E785 Hyperlipidemia, unspecified: Secondary | ICD-10-CM | POA: Diagnosis not present

## 2020-09-20 DIAGNOSIS — C3431 Malignant neoplasm of lower lobe, right bronchus or lung: Secondary | ICD-10-CM | POA: Diagnosis not present

## 2020-09-20 LAB — CBC WITH DIFFERENTIAL/PLATELET
Abs Immature Granulocytes: 0.06 10*3/uL (ref 0.00–0.07)
Basophils Absolute: 0.1 10*3/uL (ref 0.0–0.1)
Basophils Relative: 1 %
Eosinophils Absolute: 0.2 10*3/uL (ref 0.0–0.5)
Eosinophils Relative: 2 %
HCT: 29.7 % — ABNORMAL LOW (ref 36.0–46.0)
Hemoglobin: 9.9 g/dL — ABNORMAL LOW (ref 12.0–15.0)
Immature Granulocytes: 1 %
Lymphocytes Relative: 8 %
Lymphs Abs: 1 10*3/uL (ref 0.7–4.0)
MCH: 28.5 pg (ref 26.0–34.0)
MCHC: 33.3 g/dL (ref 30.0–36.0)
MCV: 85.6 fL (ref 80.0–100.0)
Monocytes Absolute: 1.4 10*3/uL — ABNORMAL HIGH (ref 0.1–1.0)
Monocytes Relative: 12 %
Neutro Abs: 9.2 10*3/uL — ABNORMAL HIGH (ref 1.7–7.7)
Neutrophils Relative %: 76 %
Platelets: 490 10*3/uL — ABNORMAL HIGH (ref 150–400)
RBC: 3.47 MIL/uL — ABNORMAL LOW (ref 3.87–5.11)
RDW: 14.6 % (ref 11.5–15.5)
WBC: 12 10*3/uL — ABNORMAL HIGH (ref 4.0–10.5)
nRBC: 0 % (ref 0.0–0.2)

## 2020-09-20 LAB — COMPREHENSIVE METABOLIC PANEL
ALT: 26 U/L (ref 0–44)
AST: 26 U/L (ref 15–41)
Albumin: 2.4 g/dL — ABNORMAL LOW (ref 3.5–5.0)
Alkaline Phosphatase: 113 U/L (ref 38–126)
Anion gap: 11 (ref 5–15)
BUN: 13 mg/dL (ref 8–23)
CO2: 24 mmol/L (ref 22–32)
Calcium: 8.8 mg/dL — ABNORMAL LOW (ref 8.9–10.3)
Chloride: 97 mmol/L — ABNORMAL LOW (ref 98–111)
Creatinine, Ser: 0.56 mg/dL (ref 0.44–1.00)
GFR, Estimated: >60 mL/min (ref >60)
Glucose, Bld: 151 mg/dL — ABNORMAL HIGH (ref 70–99)
Potassium: 3.3 mmol/L — ABNORMAL LOW (ref 3.5–5.1)
Sodium: 132 mmol/L — ABNORMAL LOW (ref 135–145)
Total Bilirubin: 0.4 mg/dL (ref 0.3–1.2)
Total Protein: 6.4 g/dL — ABNORMAL LOW (ref 6.5–8.1)

## 2020-09-20 LAB — TSH: TSH: 1.494 u[IU]/mL (ref 0.350–4.500)

## 2020-09-20 MED ORDER — HEPARIN SOD (PORK) LOCK FLUSH 100 UNIT/ML IV SOLN
500.0000 [IU] | Freq: Once | INTRAVENOUS | Status: DC
  Filled 2020-09-20: qty 5

## 2020-09-20 MED ORDER — DIPHENHYDRAMINE HCL 50 MG/ML IJ SOLN
50.0000 mg | Freq: Once | INTRAMUSCULAR | Status: AC
  Administered 2020-09-20: 50 mg via INTRAVENOUS
  Filled 2020-09-20: qty 1

## 2020-09-20 MED ORDER — SODIUM CHLORIDE 0.9 % IV SOLN
10.0000 mg | Freq: Once | INTRAVENOUS | Status: AC
  Administered 2020-09-20: 10 mg via INTRAVENOUS
  Filled 2020-09-20: qty 10

## 2020-09-20 MED ORDER — PALONOSETRON HCL INJECTION 0.25 MG/5ML
0.2500 mg | Freq: Once | INTRAVENOUS | Status: AC
  Administered 2020-09-20: 0.25 mg via INTRAVENOUS
  Filled 2020-09-20: qty 5

## 2020-09-20 MED ORDER — SODIUM CHLORIDE 0.9 % IV SOLN
200.0000 mg | Freq: Once | INTRAVENOUS | Status: AC
  Administered 2020-09-20: 200 mg via INTRAVENOUS
  Filled 2020-09-20: qty 8

## 2020-09-20 MED ORDER — FAMOTIDINE 20 MG IN NS 100 ML IVPB
20.0000 mg | Freq: Once | INTRAVENOUS | Status: AC
  Administered 2020-09-20: 20 mg via INTRAVENOUS
  Filled 2020-09-20: qty 100
  Filled 2020-09-20: qty 20

## 2020-09-20 MED ORDER — SODIUM CHLORIDE 0.9% FLUSH
10.0000 mL | Freq: Once | INTRAVENOUS | Status: AC
  Administered 2020-09-20: 10 mL via INTRAVENOUS
  Filled 2020-09-20: qty 10

## 2020-09-20 MED ORDER — SODIUM CHLORIDE 0.9 % IV SOLN
160.0000 mg/m2 | Freq: Once | INTRAVENOUS | Status: AC
  Administered 2020-09-20: 228 mg via INTRAVENOUS
  Filled 2020-09-20: qty 38

## 2020-09-20 MED ORDER — HEPARIN SOD (PORK) LOCK FLUSH 100 UNIT/ML IV SOLN
500.0000 [IU] | Freq: Once | INTRAVENOUS | Status: AC | PRN
  Administered 2020-09-20: 500 [IU]
  Filled 2020-09-20: qty 5

## 2020-09-20 MED ORDER — TRAMADOL HCL 50 MG PO TABS: 50.0000 mg | ORAL_TABLET | Freq: Three times a day (TID) | ORAL | 0 refills | Status: DC | PRN

## 2020-09-20 MED ORDER — POTASSIUM CHLORIDE IN NACL 20-0.9 MEQ/L-% IV SOLN
Freq: Once | INTRAVENOUS | Status: AC
  Filled 2020-09-20: qty 1000

## 2020-09-20 MED ORDER — SODIUM CHLORIDE 0.9 % IV SOLN
234.8000 mg | Freq: Once | INTRAVENOUS | Status: AC
  Administered 2020-09-20: 230 mg via INTRAVENOUS
  Filled 2020-09-20: qty 23

## 2020-09-20 MED ORDER — SODIUM CHLORIDE 0.9 % IV SOLN
Freq: Once | INTRAVENOUS | Status: AC
  Filled 2020-09-20: qty 250

## 2020-09-20 MED ORDER — SODIUM CHLORIDE 0.9 % IV SOLN
150.0000 mg | Freq: Once | INTRAVENOUS | Status: AC
  Administered 2020-09-20: 150 mg via INTRAVENOUS
  Filled 2020-09-20: qty 150

## 2020-09-20 NOTE — Progress Notes (Signed)
[facility]  [phone] [phone]   Name: Tyan Lasure Date: 09/20/2020 MRN: [id number]  DOB: 18-Sep-1940  Patient Care Team: Lavera Guise, MD as PCP - General (Internal Medicine) Telford Nab, RN as Registered Nurse    REASON FOR CONSULTATION: Latoya Lynch is a 80 y.o. 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  Patient has had falls and word searching.  She was referred to palliative care to help address goals and manage ongoing symptoms.  SOCIAL HISTORY:     reports that she has been smoking cigarettes. She has a 15.00 pack-year smoking history. She has never used smokeless tobacco. She reports previous alcohol use. She reports that she does not use drugs.  Patient is unmarried lives at home alone.  She has a daughter who lives nearby and a son in [place].  Patient retired as a Pharmacist, hospital in [place].  ADVANCE DIRECTIVES:  Not on file  CODE STATUS: DNR/DNI (MOST form signed on 09/20/2020)  PAST MEDICAL HISTORY: Past Medical History:  Diagnosis Date   Arthritis    COPD (chronic obstructive pulmonary disease) ([place])    Depression    History of methicillin resistant staphylococcus aureus (MRSA)    Hyperlipidemia    Lung mass    right lung   Osteoarthritis     PAST SURGICAL HISTORY:  Past Surgical History:  Procedure Laterality Date   CATARACT EXTRACTION W/ INTRAOCULAR LENS  IMPLANT, BILATERAL     CEREBRAL ANEURYSM REPAIR  01/2003   ELECTROMAGNETIC NAVIGATION BROCHOSCOPY Right 07/30/2018   Procedure: ELECTROMAGNETIC NAVIGATION BRONCHOSCOPY RIGHT;  Surgeon: Tyler Pita, MD;  Location: [facility];  Service: Cardiopulmonary;  Laterality: Right;   ENDOBRONCHIAL ULTRASOUND Right 07/30/2018   Procedure: ENDOBRONCHIAL ULTRASOUND RIGHT;  Surgeon: Tyler Pita, MD;  Location: [facility] OR[facility];  Service: Cardiopulmonary;   Laterality: Right;   PORTA CATH INSERTION N/A 09/16/2020   Procedure: PORTA CATH INSERTION;  Surgeon: Algernon Huxley, MD;  Location: [facility];  Service: Cardiovascular;  Laterality: N/A;   retinal detatchment Right 05/2017   TONSILLECTOMY     TOTAL HIP ARTHROPLASTY Right 01/2015   TOTAL HIP ARTHROPLASTY Left 05/2019   in [place]      HEMATOLOGY/ONCOLOGY HISTORY:  Oncology History  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  MEDICATIONS:  Current Outpatient Medications  Medication Sig Dispense Refill   buPROPion (WELLBUTRIN SR) 150 MG 12 hr tablet Take 150 mg by mouth 2 (two) times daily.      cetirizine (ZYRTEC) 10 MG tablet Take 10 mg by mouth daily. (Patient not taking: No sig reported)     dexamethasone (DECADRON) 4 MG tablet Take 2 tablets (8 mg total) by mouth daily. Start the day after carboplatin chemotherapy for 3 days. 30 tablet 1   EPINEPHrine 0.3 mg/0.3 mL IJ SOAJ injection Inject 0.3  mg into the muscle as needed for anaphylaxis.     ipratropium-albuterol (DUONEB) 0.5-2.5 (3) MG/3ML SOLN USE 1 VIAL  IN  NEBULIZER TWICE  DAILY (Patient not taking: Reported on 09/20/2020) 180 mL 11   lidocaine-prilocaine (EMLA) cream Apply to affected area once 30 g 3   LORazepam (ATIVAN) 0.5 MG tablet Take 1 tablet (0.5 mg total) by mouth every 6 (six) hours as needed (Nausea or vomiting). (Patient not taking: Reported on 09/20/2020) 30 tablet 0   methocarbamol  (ROBAXIN) 750 MG tablet Take 750 mg by mouth daily. (Patient not taking: No sig reported)     naproxen sodium (ALEVE) 220 MG tablet Take 440 mg by mouth every morning.      Omega-3 1000 MG CAPS Take 1,000 mg by mouth daily.      ondansetron (ZOFRAN) 8 MG tablet Take 1 tablet (8 mg total) by mouth 2 (two) times daily as needed for refractory nausea / vomiting. Start on day 3 after carboplatin chemo. 30 tablet 1   riboflavin (VITAMIN B-2) 100 MG TABS tablet Take 200 mg by mouth in the morning and at bedtime.     rosuvastatin (CRESTOR) 5 MG tablet Take 1 tablet (5 mg total) by mouth daily. (Patient not taking: No sig reported) 90 tablet 1   vitamin B-12 (CYANOCOBALAMIN) 500 MCG tablet Take 500 mcg by mouth daily.     No current facility-administered medications for this visit.    VITAL SIGNS: There were no vitals taken for this visit. There were no vitals filed for this visit.  Estimated body mass index is 19.61 kg/m as calculated from the following:   Height as of an earlier encounter on 09/20/20: 5\' 2"  (1.575 m).   Weight as of an earlier encounter on 09/20/20: 107 lb 3.2 oz (48.6 kg).  LABS: CBC:    Component Value Date/Time   WBC 12.0 (H) 09/20/2020 0834   HGB 9.9 (L) 09/20/2020 0834   HCT 29.7 (L) 09/20/2020 0834   PLT 490 (H) 09/20/2020 0834   MCV 85.6 09/20/2020 0834   NEUTROABS 9.2 (H) 09/20/2020 0834   LYMPHSABS 1.0 09/20/2020 0834   MONOABS 1.4 (H) 09/20/2020 0834   EOSABS 0.2 09/20/2020 0834   BASOSABS 0.1 09/20/2020 0834   Comprehensive Metabolic Panel:    Component Value Date/Time   NA 132 (L) 09/20/2020 0834   K 3.3 (L) 09/20/2020 0834   CL 97 (L) 09/20/2020 0834   CO2 24 09/20/2020 0834   BUN 13 09/20/2020 0834   CREATININE 0.56 09/20/2020 0834   GLUCOSE 151 (H) 09/20/2020 0834   CALCIUM 8.8 (L) 09/20/2020 0834   AST 26 09/20/2020 0834   ALT 26 09/20/2020 0834   ALKPHOS 113 09/20/2020 0834   BILITOT 0.4 09/20/2020 0834   PROT 6.4 (L) 09/20/2020 0834    ALBUMIN 2.4 (L) 09/20/2020 0834    RADIOGRAPHIC STUDIES: MR Brain W Wo Contrast  Result Date: 08/31/2020 CLINICAL DATA:  Non-small cell lung cancer, monitor. EXAM: MRI HEAD WITHOUT AND WITH CONTRAST TECHNIQUE: Multiplanar, multiecho pulse sequences of the brain and surrounding structures were obtained without and with intravenous contrast. CONTRAST:  69mL GADAVIST GADOBUTROL 1 MMOL/ML IV SOLN COMPARISON:  Head CT April 05, 2020. FINDINGS: Brain: No acute infarction, hemorrhage, hydrocephalus, extra-axial collection or mass lesion. 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 Scattered and confluent foci of T2 hyperintensity are seen within the white matter of the cerebral hemispheres and within the  pons, nonspecific, most likely related to chronic small vessel ischemia. Susceptibility artifact lining the cerebral sulci in the bilateral parietooccipital region suggestive of superficial siderosis. Moderate parenchymal volume loss. No focus of abnormal contrast enhancement. Vascular: Normal flow voids. Skull and upper cervical spine: Postsurgical changes from right frontal craniotomy. Marrow signal characteristics are otherwise maintained. Soft tissues thickening posterior to the odontoid process may be degenerative versus CPPD. Sinuses/Orbits: Bilateral lens surgery. Paranasal sinuses are clear. IMPRESSION: 1. No focus of abnormal contrast enhancement to suggest intracranial metastatic disease. 2. 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 3. Moderate chronic microvascular ischemic changes of the white matter and parenchymal volume loss. 4. 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 Electronically Signed   By: Pedro Earls M.D.   On: 08/31/2020 13:09   PERIPHERAL VASCULAR CATHETERIZATION  Result Date: 09/16/2020 See surgical note for result.    PERFORMANCE STATUS (ECOG) : 2 - Symptomatic, <50% confined to bed  Review of Systems Unless otherwise noted, a complete review of systems is negative.  Physical Exam General: NAD Pulmonary: Unlabored Extremities: no edema, no joint deformities Skin: no rashes Neurological: Weakness, has word searching  IMPRESSION: Patient was added onto my schedule today to complete a MOST form.  Patient brought in her living will, which was scanned into the chart.  They are in process of completing healthcare power of attorney documents.  Patient confirmed that she is not interested in resuscitation nor does she wish for her life to be prolonged on machines.  She would be okay with hospitalization to treat the treatable.   I completed a MOST form today. The patient and family outlined their wishes for the following treatment decisions:  Cardiopulmonary Resuscitation: Do Not Attempt Resuscitation (DNR/No CPR)  Medical Interventions: Limited Additional Interventions: Use medical treatment, IV fluids and cardiac monitoring as indicated, DO NOT USE intubation or mechanical ventilation. May consider use of less invasive airway support such as BiPAP or CPAP. Also provide comfort measures. Transfer to the hospital if indicated. Avoid intensive care.   Antibiotics: Determine use of limitation of antibiotics when infection occurs  IV Fluids: IV fluids for a defined trial period  Feeding Tube: No feeding tube     PLAN: -Continue current scope of treatment -DNR/DNI -MOST form signed -RTC 3 to 4 weeks  Case and plan discussed with Dr. Janese Banks   Patient expressed understanding and was in agreement with this plan. She also understands that She can call the clinic at any time with any questions, concerns, or complaints.     Time Total: 15 minutes  Visit consisted of counseling and education dealing with the complex and emotionally intense issues of symptom management and palliative care in the setting  of serious and potentially life-threatening illness.Greater than 50%  of this time was spent counseling and coordinating care related to the above assessment and plan.  Signed by: Altha Harm, PhD, NP-C

## 2020-09-20 NOTE — Progress Notes (Signed)
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  N March 2020 and she underwent a bronchoscopy which did not reveal any malignancy.  Cytology was suspicious but not diagnostic for malignancy.  She then underwent SBRT for the same.  Subsequently in August 2021 she was found to have an endoluminal filling defect in the area of the right hilum.  She again underwent empiric radiation to this area which was hypermetabolic on PET CT scan measuring 2.4 cm.  She has not required any systemic chemotherapy so far    Recent surveillance CT scan in May 2022 showed concerning liver metastases and was followed by a PET scan.  PET scan showed multiple FDG avid metastases in the lateral segment of the left hepatic lobe measuring up to 3.9 cm.  No evidence of recurrent tumor was seen in the lung.  No other evidence of metastatic disease.     NGS testing showed PD-L1 of 95% but no other actionable mutations.Tumor positive for T p53 and NF P2 L2R34G  Plan is to proceed with 4 cycles of CarboTaxol Keytruda if tolerated followed by maintenance Keytruda    Interval history-no changes in her health as compared to last visit.  She has baseline fatigue.  She also complains of low back pain for which she uses Aleve 2 times a day  ECOG PS- 2 Pain scale- 3  Review of systems- Review of Systems  Constitutional:  Positive for  malaise/fatigue. Negative for chills, fever and weight loss.  HENT:  Negative for congestion, ear discharge and nosebleeds.   Eyes:  Negative for blurred vision.  Respiratory:  Negative for cough, hemoptysis, sputum production, shortness of breath and wheezing.   Cardiovascular:  Negative for chest pain, palpitations, orthopnea and claudication.  Gastrointestinal:  Negative for abdominal pain, blood in stool, constipation, diarrhea, heartburn, melena, nausea and vomiting.  Genitourinary:  Negative for dysuria, flank pain, frequency, hematuria and urgency.  Musculoskeletal:  Positive for back pain. Negative for joint pain and myalgias.  Skin:  Negative for rash.  Neurological:  Negative for dizziness, tingling, focal weakness, seizures, weakness and headaches.  Endo/Heme/Allergies:  Does not bruise/bleed easily.  Psychiatric/Behavioral:  Negative for depression and suicidal ideas. The patient does not have insomnia.     Allergies  Allergen Reactions   Other Anaphylaxis    All nuts   Peanut Oil Anaphylaxis   Prochlorperazine Anaphylaxis     Past Medical History:  Diagnosis Date   Arthritis    COPD (chronic obstructive pulmonary disease) (HCC)    Depression    History of methicillin resistant staphylococcus aureus (MRSA)    Hyperlipidemia    Lung mass    right lung   Osteoarthritis      Past Surgical History:  Procedure Laterality Date   CATARACT EXTRACTION W/ INTRAOCULAR LENS  IMPLANT, BILATERAL     CEREBRAL ANEURYSM REPAIR  01/2003   ELECTROMAGNETIC NAVIGATION BROCHOSCOPY Right 07/30/2018   Procedure: ELECTROMAGNETIC  NAVIGATION BRONCHOSCOPY RIGHT;  Surgeon: Tyler Pita, MD;  Location: [facility] ORS;  Service: Cardiopulmonary;  Laterality: Right;   ENDOBRONCHIAL ULTRASOUND Right 07/30/2018   Procedure: ENDOBRONCHIAL ULTRASOUND RIGHT;  Surgeon: Tyler Pita, MD;  Location: [facility] ORS;  Service: Cardiopulmonary;  Laterality: Right;   PORTA CATH INSERTION N/A 09/16/2020    Procedure: PORTA CATH INSERTION;  Surgeon: Algernon Huxley, MD;  Location: [facility];  Service: Cardiovascular;  Laterality: N/A;   retinal detatchment Right 05/2017   TONSILLECTOMY     TOTAL HIP ARTHROPLASTY Right 01/2015   TOTAL HIP ARTHROPLASTY Left 05/2019   in [place] History   Socioeconomic History   Marital status: Widowed    Spouse name: Not on file   Number of children: 3   Years of education: Not on file   Highest education level: Not on file  Occupational History   Not on file  Tobacco Use   Smoking status: Every Day    Packs/day: 0.25    Years: 60.00    Pack years: 15.00    Types: Cigarettes   Smokeless tobacco: Never   Tobacco comments:    about 5-10 cigarettes a day-11/18/2019 and working on decreasing and working towards quitting  Vaping Use   Vaping Use: Never used  Substance and Sexual Activity   Alcohol use: Not Currently    Comment: once in a great while   Drug use: Never   Sexual activity: Not Currently  Other Topics Concern   Not on file  Social History Narrative   Lives by herself with a Risk analyst    Social Determinants of Health   Financial Resource Strain: Not on file  Food Insecurity: Not on file  Transportation Needs: Not on file  Physical Activity: Not on file  Stress: Not on file  Social Connections: Not on file  Intimate Partner Violence: Not on file    Family History  Problem Relation Age of Onset   Heart attack Mother    Cancer Maternal Grandmother 80       spine ca   Heart attack Maternal Grandfather      Current Outpatient Medications:    buPROPion (WELLBUTRIN SR) 150 MG 12 hr tablet, Take 150 mg by mouth 2 (two) times daily. , Disp: , Rfl:    cetirizine (ZYRTEC) 10 MG tablet, Take 10 mg by mouth daily. (Patient not taking: Reported on 09/16/2020), Disp: , Rfl:    chlorpheniramine-HYDROcodone (TUSSIONEX) 10-8 MG/5ML SUER, Take 5 mLs by mouth every 12 (twelve) hours as needed for  cough., Disp: 140 mL, Rfl: 0   dexamethasone (DECADRON) 4 MG tablet, Take 2 tablets (8 mg total) by mouth daily. Start the day after carboplatin chemotherapy for 3 days., Disp: 30 tablet, Rfl: 1   EPINEPHrine 0.3 mg/0.3 mL IJ SOAJ injection, Inject 0.3 mg into the muscle as needed for anaphylaxis., Disp: , Rfl:    ipratropium-albuterol (DUONEB) 0.5-2.5 (3) MG/3ML SOLN, USE 1 VIAL  IN  NEBULIZER TWICE  DAILY, Disp: 180 mL, Rfl: 11   lidocaine-prilocaine (EMLA) cream, Apply to affected area once, Disp: 30 g, Rfl: 3   LORazepam (ATIVAN) 0.5 MG tablet, Take 1 tablet (0.5 mg total) by mouth every 6 (six) hours as needed (Nausea or vomiting)., Disp: 30 tablet, Rfl: 0   meloxicam (MOBIC) 15 MG tablet, Take 15 mg by mouth 3 (three) times daily.  (Patient not taking: No sig reported), Disp: , Rfl:  methocarbamol (ROBAXIN) 750 MG tablet, Take 750 mg by mouth daily. (Patient not taking: No sig reported), Disp: , Rfl:    naproxen sodium (ALEVE) 220 MG tablet, Take 440 mg by mouth every morning. , Disp: , Rfl:    Omega-3 1000 MG CAPS, Take 1,000 mg by mouth daily. , Disp: , Rfl:    ondansetron (ZOFRAN) 8 MG tablet, Take 1 tablet (8 mg total) by mouth 2 (two) times daily as needed for refractory nausea / vomiting. Start on day 3 after carboplatin chemo., Disp: 30 tablet, Rfl: 1   prochlorperazine (COMPAZINE) 10 MG tablet, Take 1 tablet (10 mg total) by mouth every 6 (six) hours as needed (Nausea or vomiting). (Patient not taking: Reported on 09/16/2020), Disp: 30 tablet, Rfl: 1   riboflavin (VITAMIN B-2) 100 MG TABS tablet, Take 200 mg by mouth in the morning and at bedtime.  (Patient not taking: No sig reported), Disp: , Rfl:    rosuvastatin (CRESTOR) 5 MG tablet, Take 1 tablet (5 mg total) by mouth daily. (Patient not taking: No sig reported), Disp: 90 tablet, Rfl: 1   vitamin B-12 (CYANOCOBALAMIN) 500 MCG tablet, Take 500 mcg by mouth daily., Disp: , Rfl:  No current facility-administered medications for this  visit.  Facility-Administered Medications Ordered in Other Visits:    heparin lock flush 100 unit/mL, 500 Units, Intravenous, Once, Cammie Sickle, MD  Physical exam:  Vitals:   09/20/20 0911  BP: 113/67  Pulse: (!) 110  Resp: 16  Temp: 98.1 F (36.7 C)  TempSrc: Oral  Weight: 107 lb 3.2 oz (48.6 kg)  Height: 5' 2"  (1.575 m)   Physical Exam Constitutional:      Comments: Elderly female sitting in a wheelchair.  Appears in no acute distress  Cardiovascular:     Rate and Rhythm: Regular rhythm. Tachycardia present.     Heart sounds: Normal heart sounds.  Pulmonary:     Effort: Pulmonary effort is normal.     Breath sounds: Normal breath sounds.  Abdominal:     General: Bowel sounds are normal.     Palpations: Abdomen is soft.  Skin:    General: Skin is warm and dry.  Neurological:     Mental Status: She is alert and oriented to person, place, and time.     CMP Latest Ref Rng & Units 09/20/2020  Glucose 70 - 99 mg/dL 151(H)  BUN 8 - 23 mg/dL 13  Creatinine 0.44 - 1.00 mg/dL 0.56  Sodium 135 - 145 mmol/L 132(L)  Potassium 3.5 - 5.1 mmol/L 3.3(L)  Chloride 98 - 111 mmol/L 97(L)  CO2 22 - 32 mmol/L 24  Calcium 8.9 - 10.3 mg/dL 8.8(L)  Total Protein 6.5 - 8.1 g/dL 6.4(L)  Total Bilirubin 0.3 - 1.2 mg/dL 0.4  Alkaline Phos 38 - 126 U/L 113  AST 15 - 41 U/L 26  ALT 0 - 44 U/L 26   CBC Latest Ref Rng & Units 09/20/2020  WBC 4.0 - 10.5 K/uL 12.0(H)  Hemoglobin 12.0 - 15.0 g/dL 9.9(L)  Hematocrit 36.0 - 46.0 % 29.7(L)  Platelets 150 - 400 K/uL 490(H)    No images are attached to the encounter.  MR Brain W Wo Contrast  Result Date: 08/31/2020 CLINICAL DATA:  Non-small cell lung cancer, monitor. EXAM: MRI HEAD WITHOUT AND WITH CONTRAST TECHNIQUE: Multiplanar, multiecho pulse sequences of the brain and surrounding structures were obtained without and with intravenous contrast. CONTRAST:  96m GADAVIST GADOBUTROL 1 MMOL/ML IV SOLN COMPARISON:  Head  CT April 05, 2020. FINDINGS: Brain: No acute infarction, hemorrhage, hydrocephalus, extra-axial collection or mass lesion. 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 Scattered and confluent foci of T2 hyperintensity are seen within the white matter of the cerebral hemispheres and within the pons, nonspecific, most likely related to chronic small vessel ischemia. Susceptibility artifact lining the cerebral sulci in the bilateral parietooccipital region suggestive of superficial siderosis. Moderate parenchymal volume loss. No focus of abnormal contrast enhancement. Vascular: Normal flow voids. Skull and upper cervical spine: Postsurgical changes from right frontal craniotomy. Marrow signal characteristics are otherwise maintained. Soft tissues thickening posterior to the odontoid process may be degenerative versus CPPD. Sinuses/Orbits: Bilateral lens surgery. Paranasal sinuses are clear. IMPRESSION: 1. No focus of abnormal contrast enhancement to suggest intracranial metastatic disease. 2. 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 3. Moderate chronic microvascular ischemic changes of the white matter and parenchymal volume loss. 4. 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 Electronically Signed   By: Pedro Earls M.D.   On: 08/31/2020 13:09   PERIPHERAL VASCULAR CATHETERIZATION  Result Date: 09/16/2020 See surgical note for result.    Assessment and plan- Patient is a 80 y.o. 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  She is here for on treatment assessment prior to cycle 1 of CarboTaxol Keytruda chemotherapy  Counts okay to proceed with cycle 1 of CarboTaxol Keytruda chemotherapy today and she will receive growth factor support in 2 days.Return to clinic in 10 days for possible IV fluids with labs and I will see her back  in 3 weeks for cycle 2.  If patient has problems tolerating chemotherapy and I will switch her to single agent Keytruda alone.  Have already dose reduced her carboplatin to AUC 4 and Taxol at 160 mg per metered square.  She has some baseline anemia with a hemoglobin of 9.9.  I will plan to check ferritin and iron studies B12 and folate at next set of labs.  Again discussed risks and benefits of chemotherapy including all but not limited to nausea, vomiting, low blood counts, risk of infections and hospitalizations.  Risk of autoimmune side effects associated with Keytruda.  Patient understands and agrees to proceed as planned  Advance directives and MOST form completed today.  She will receive 1 L of IV fluids and 20 mEq of IV potassium for her hyponatremia and hypokalemia   Visit Diagnosis 1. Encounter for antineoplastic chemotherapy   2. Encounter for antineoplastic immunotherapy   3. 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, right ([place])   4. Hypokalemia      Dr. Randa Evens, MD, MPH [facility] [phone] 09/20/2020 12:39 PM

## 2020-09-20 NOTE — Progress Notes (Signed)
Ok to tx per MD.abdominal tenderness 1428- pt woke up from sleeping and had cough. Denies sob / pain . Paused taxol.  Vital signs stable. Gave drink of water . No further symptoms. Restarted taxol after few minutes and completed without incidence.

## 2020-09-20 NOTE — Telephone Encounter (Signed)
Error

## 2020-09-20 NOTE — Patient Instructions (Signed)
[facility] ONCOL[facility]  Discharge Instructions: Thank you for choosing [facility] to provide your oncology and hematology care.  If you have a lab appointment with the [facility], please go directly to the [facility] and check in at the registration area.  Wear comfortable clothing and clothing appropriate for easy access to any Portacath or PICC line.   We strive to give you quality time with your provider. You may need to reschedule your appointment if you arrive late (15 or more minutes).  Arriving late affects you and other patients whose appointments are after yours.  Also, if you miss three or more appointments without notifying the office, you may be dismissed from the clinic at the provider's discretion.      For prescription refill requests, have your pharmacy contact our office and allow 72 hours for refills to be completed.    Today you received the following chemotherapy and/or immunotherapy agents : Keytruda / Taxol / Carboplatin   To help prevent nausea and vomiting after your treatment, we encourage you to take your nausea medication as directed.  BELOW ARE SYMPTOMS THAT SHOULD BE REPORTED IMMEDIATELY: *FEVER GREATER THAN 100.4 F (38 C) OR HIGHER *CHILLS OR SWEATING *NAUSEA AND VOMITING THAT IS NOT CONTROLLED WITH YOUR NAUSEA MEDICATION *UNUSUAL SHORTNESS OF BREATH *UNUSUAL BRUISING OR BLEEDING *URINARY PROBLEMS (pain or burning when urinating, or frequent urination) *BOWEL PROBLEMS (unusual diarrhea, constipation, pain near the anus) TENDERNESS IN MOUTH AND THROAT WITH OR WITHOUT PRESENCE OF ULCERS (sore throat, sores in mouth, or a toothache) UNUSUAL RASH, SWELLING OR PAIN  UNUSUAL VAGINAL DISCHARGE OR ITCHING   Items with * indicate a potential emergency and should be followed up as soon as possible or go to the Emergency Department if any problems should occur.  Please show the CHEMOTHERAPY ALERT CARD or IMMUNOTHERAPY  ALERT CARD at check-in to the Emergency Department and triage nurse.  Should you have questions after your visit or need to cancel or reschedule your appointment, please contact [facility]  [phone] and follow the prompts.  Office hours are 8:00 a.m. to 4:30 p.m. Monday - Friday. Please note that voicemails left after 4:00 p.m. may not be returned until the following business day.  We are closed weekends and major holidays. You have access to a nurse at all times for urgent questions. Please call the main number to the clinic [phone] and follow the prompts.  For any non-urgent questions, you may also contact your provider using MyChart. We now offer e-Visits for anyone 71 and older to request care online for non-urgent symptoms. For details visit [email or web address].   Also download the MyChart app! Go to the app store, search "MyChart", open the app, select [facility], and log in with your MyChart username and password.  Due to Covid, a mask is required upon entering the hospital/clinic. If you do not have a mask, one will be given to you upon arrival. For doctor visits, patients may have 1 support person aged 85 or older with them. For treatment visits, patients cannot have anyone with them due to current Covid guidelines and our immunocompromised population.

## 2020-09-20 NOTE — Telephone Encounter (Signed)
09/20/20 @ 12 PM: Palliative care SW outreached patients SIL Derek, per [facility] NP - L. Rivers, to assess needs.  SIL inquired about info on life alert system and MOW's. SIL states that hey have a good le mini in the home for patient but inquired about other options and fall detection. SW and SIL discussed the options of cameras in the home as well.  SIL shared that patient started her first immuno therapy treatment today and has been eating pretty well now. Daughter and SILbring patient meals. Patient is able to make own meals and warm things up. Patient has a nut allergy and enjoys fruit.  SW will email SIL a list of life alert providers and cost. SW made MOW's referral for patient as well. SIL had no other concerns for SW at this time.

## 2020-09-20 NOTE — Addendum Note (Signed)
Addended by: Luella Cook on: 09/20/2020 12:57 PM   Modules accepted: Orders

## 2020-09-21 LAB — T4: T4, Total: 6.7 ug/dL (ref 4.5–12.0)

## 2020-09-22 ENCOUNTER — Inpatient Hospital Stay: Payer: Medicare Other

## 2020-09-22 ENCOUNTER — Telehealth: Payer: Self-pay

## 2020-09-22 ENCOUNTER — Other Ambulatory Visit: Payer: Self-pay

## 2020-09-22 DIAGNOSIS — C3431 Malignant neoplasm of lower lobe, right bronchus or lung: Secondary | ICD-10-CM | POA: Diagnosis not present

## 2020-09-22 DIAGNOSIS — Z5112 Encounter for antineoplastic immunotherapy: Secondary | ICD-10-CM | POA: Diagnosis not present

## 2020-09-22 DIAGNOSIS — Z7952 Long term (current) use of systemic steroids: Secondary | ICD-10-CM | POA: Diagnosis not present

## 2020-09-22 DIAGNOSIS — E785 Hyperlipidemia, unspecified: Secondary | ICD-10-CM | POA: Diagnosis not present

## 2020-09-22 DIAGNOSIS — C787 Secondary malignant neoplasm of liver and intrahepatic bile duct: Secondary | ICD-10-CM | POA: Diagnosis not present

## 2020-09-22 DIAGNOSIS — E860 Dehydration: Secondary | ICD-10-CM | POA: Diagnosis not present

## 2020-09-22 DIAGNOSIS — Z5111 Encounter for antineoplastic chemotherapy: Secondary | ICD-10-CM | POA: Diagnosis not present

## 2020-09-22 DIAGNOSIS — E876 Hypokalemia: Secondary | ICD-10-CM | POA: Diagnosis not present

## 2020-09-22 DIAGNOSIS — Z5189 Encounter for other specified aftercare: Secondary | ICD-10-CM | POA: Diagnosis not present

## 2020-09-22 DIAGNOSIS — D649 Anemia, unspecified: Secondary | ICD-10-CM | POA: Diagnosis not present

## 2020-09-22 DIAGNOSIS — F17210 Nicotine dependence, cigarettes, uncomplicated: Secondary | ICD-10-CM | POA: Diagnosis not present

## 2020-09-22 DIAGNOSIS — E871 Hypo-osmolality and hyponatremia: Secondary | ICD-10-CM | POA: Diagnosis not present

## 2020-09-22 DIAGNOSIS — J449 Chronic obstructive pulmonary disease, unspecified: Secondary | ICD-10-CM | POA: Diagnosis not present

## 2020-09-22 DIAGNOSIS — Z79899 Other long term (current) drug therapy: Secondary | ICD-10-CM | POA: Diagnosis not present

## 2020-09-22 DIAGNOSIS — C3491 Malignant neoplasm of unspecified part of right bronchus or lung: Secondary | ICD-10-CM

## 2020-09-22 MED ORDER — PEGFILGRASTIM-BMEZ 6 MG/0.6ML ~~LOC~~ SOSY
6.0000 mg | PREFILLED_SYRINGE | Freq: Once | SUBCUTANEOUS | Status: AC
  Administered 2020-09-22: 6 mg via SUBCUTANEOUS
  Filled 2020-09-22: qty 0.6

## 2020-09-22 NOTE — Progress Notes (Signed)
Nutrition Assessment:  80 year old female with stage IV lung cancer s/p SBRT now with mets to liver.  Past medical history of COPD, HLD, falls.  Patient receiving carbo/taxol and [place].  Met with patient and son in [place], applications in clinic.  Patient reports appetite is about 50%.  Appetite has improved in the last week or so due to daughter and son in law helping out with preparing meals (live nearby).  Prior to patient was eating mostly frozen meals.  Patient reports breakfast is usually muffin or cereal. Lunch maybe fruit, thinks she had baked chicken yesterday. Supper last night was cheese and sausage pasta that daughter prepared.  Tried oral nutrition supplement.  Allergic to nuts.  Noted Meals on Wheels referral by home Palliative Care SW.  Patient reports slight nausea before coming today.  Reports constipation as well, takes miralax.  Patient with difficulty with memory/word searching during visit.      Medications: decadron, Vit B 12, zofran, compazine  Labs: Na 132, K 3.3, glucose 151  Anthropometrics:   Height: 62 inches Weight: 107 lb 3.2 on 7/19 UBW: 114 lb 9.6 on 6/17 117 lb 6.4 oz on 5/26 BMI: 19  8% weight loss in the last  2 months, significant   Estimated Energy Needs  Kcals: 1500-1700 Protein: 75-85 g Fluid: 1.5 L  NUTRITION DIAGNOSIS: Inadequate oral intake related to cancer and cancer related treatment side effects as evidenced by 8% weight loss in the last 2 months   INTERVENTION:  Encouraged high calorie shake (350 calories or higher). Samples of ensure plus, complete, boost plus, orgain, [employer] 1.4 given to patient to try Discussed ways to add calories and protein to diet. Handout provided Encouraged setting reminder/times to nibble/mini meal Encouraged being proactive with nausea medication. Discussed constipation, encouraged fluids Contact information given    MONITORING, EVALUATION, GOAL: weight trends, intake   NEXT VISIT: Tuesday, August 9th  during infusion  Kel Senn B. Zenia Resides, [place], [place] Registered Dietitian [phone] (mobile)

## 2020-09-22 NOTE — Telephone Encounter (Signed)
Telephone call to patient for follow up after receiving first infusion.   Patient states infusion went great.  States eating good and drinking plenty of fluids.   Denies any nausea or vomiting.  Encouraged patient to call for any concerns or questions. 

## 2020-09-28 ENCOUNTER — Other Ambulatory Visit: Payer: Self-pay

## 2020-09-28 ENCOUNTER — Inpatient Hospital Stay: Payer: Medicare Other

## 2020-09-28 ENCOUNTER — Inpatient Hospital Stay (HOSPITAL_BASED_OUTPATIENT_CLINIC_OR_DEPARTMENT_OTHER): Payer: Medicare Other | Admitting: Oncology

## 2020-09-28 ENCOUNTER — Encounter: Payer: Self-pay | Admitting: Oncology

## 2020-09-28 VITALS — BP 114/70 | HR 113 | Temp 98.7°F | Resp 18 | Wt 108.0 lb

## 2020-09-28 DIAGNOSIS — C3491 Malignant neoplasm of unspecified part of right bronchus or lung: Secondary | ICD-10-CM

## 2020-09-28 DIAGNOSIS — Z5111 Encounter for antineoplastic chemotherapy: Secondary | ICD-10-CM | POA: Diagnosis not present

## 2020-09-28 DIAGNOSIS — E876 Hypokalemia: Secondary | ICD-10-CM | POA: Diagnosis not present

## 2020-09-28 DIAGNOSIS — Z5112 Encounter for antineoplastic immunotherapy: Secondary | ICD-10-CM | POA: Diagnosis not present

## 2020-09-28 DIAGNOSIS — F17210 Nicotine dependence, cigarettes, uncomplicated: Secondary | ICD-10-CM | POA: Diagnosis not present

## 2020-09-28 DIAGNOSIS — J449 Chronic obstructive pulmonary disease, unspecified: Secondary | ICD-10-CM | POA: Diagnosis not present

## 2020-09-28 DIAGNOSIS — C3431 Malignant neoplasm of lower lobe, right bronchus or lung: Secondary | ICD-10-CM | POA: Diagnosis not present

## 2020-09-28 DIAGNOSIS — E860 Dehydration: Secondary | ICD-10-CM | POA: Diagnosis not present

## 2020-09-28 DIAGNOSIS — E785 Hyperlipidemia, unspecified: Secondary | ICD-10-CM | POA: Diagnosis not present

## 2020-09-28 DIAGNOSIS — Z79899 Other long term (current) drug therapy: Secondary | ICD-10-CM | POA: Diagnosis not present

## 2020-09-28 DIAGNOSIS — Z7952 Long term (current) use of systemic steroids: Secondary | ICD-10-CM | POA: Diagnosis not present

## 2020-09-28 DIAGNOSIS — E871 Hypo-osmolality and hyponatremia: Secondary | ICD-10-CM | POA: Diagnosis not present

## 2020-09-28 DIAGNOSIS — Z5189 Encounter for other specified aftercare: Secondary | ICD-10-CM | POA: Diagnosis not present

## 2020-09-28 DIAGNOSIS — D649 Anemia, unspecified: Secondary | ICD-10-CM | POA: Diagnosis not present

## 2020-09-28 DIAGNOSIS — C787 Secondary malignant neoplasm of liver and intrahepatic bile duct: Secondary | ICD-10-CM | POA: Diagnosis not present

## 2020-09-28 LAB — CBC WITH DIFFERENTIAL/PLATELET
Abs Immature Granulocytes: 0.93 10*3/uL — ABNORMAL HIGH (ref 0.00–0.07)
Basophils Absolute: 0.2 10*3/uL — ABNORMAL HIGH (ref 0.0–0.1)
Basophils Relative: 1 %
Eosinophils Absolute: 0.3 10*3/uL (ref 0.0–0.5)
Eosinophils Relative: 1 %
HCT: 31.3 % — ABNORMAL LOW (ref 36.0–46.0)
Hemoglobin: 10.4 g/dL — ABNORMAL LOW (ref 12.0–15.0)
Immature Granulocytes: 3 %
Lymphocytes Relative: 5 %
Lymphs Abs: 1.5 10*3/uL (ref 0.7–4.0)
MCH: 28.2 pg (ref 26.0–34.0)
MCHC: 33.2 g/dL (ref 30.0–36.0)
MCV: 84.8 fL (ref 80.0–100.0)
Monocytes Absolute: 2.6 10*3/uL — ABNORMAL HIGH (ref 0.1–1.0)
Monocytes Relative: 9 %
Neutro Abs: 22.1 10*3/uL — ABNORMAL HIGH (ref 1.7–7.7)
Neutrophils Relative %: 81 %
Platelets: 295 10*3/uL (ref 150–400)
RBC: 3.69 MIL/uL — ABNORMAL LOW (ref 3.87–5.11)
RDW: 15.2 % (ref 11.5–15.5)
Smear Review: NORMAL
WBC: 27.5 10*3/uL — ABNORMAL HIGH (ref 4.0–10.5)
nRBC: 0 % (ref 0.0–0.2)

## 2020-09-28 LAB — COMPREHENSIVE METABOLIC PANEL
ALT: 48 U/L — ABNORMAL HIGH (ref 0–44)
AST: 25 U/L (ref 15–41)
Albumin: 2.4 g/dL — ABNORMAL LOW (ref 3.5–5.0)
Alkaline Phosphatase: 143 U/L — ABNORMAL HIGH (ref 38–126)
Anion gap: 8 (ref 5–15)
BUN: 15 mg/dL (ref 8–23)
CO2: 23 mmol/L (ref 22–32)
Calcium: 8 mg/dL — ABNORMAL LOW (ref 8.9–10.3)
Chloride: 96 mmol/L — ABNORMAL LOW (ref 98–111)
Creatinine, Ser: 0.65 mg/dL (ref 0.44–1.00)
GFR, Estimated: >60 mL/min (ref >60)
Glucose, Bld: 122 mg/dL — ABNORMAL HIGH (ref 70–99)
Potassium: 3.7 mmol/L (ref 3.5–5.1)
Sodium: 127 mmol/L — ABNORMAL LOW (ref 135–145)
Total Bilirubin: 0.2 mg/dL — ABNORMAL LOW (ref 0.3–1.2)
Total Protein: 6 g/dL — ABNORMAL LOW (ref 6.5–8.1)

## 2020-09-28 LAB — TSH: TSH: 1.296 u[IU]/mL (ref 0.350–4.500)

## 2020-09-28 MED ORDER — HEPARIN SOD (PORK) LOCK FLUSH 100 UNIT/ML IV SOLN
500.0000 [IU] | Freq: Once | INTRAVENOUS | Status: AC
  Administered 2020-09-28: 500 [IU] via INTRAVENOUS
  Filled 2020-09-28: qty 5

## 2020-09-28 MED ORDER — SODIUM CHLORIDE 0.9 % IV SOLN
Freq: Once | INTRAVENOUS | Status: AC
  Filled 2020-09-28: qty 250

## 2020-09-28 MED ORDER — SODIUM CHLORIDE 0.9% FLUSH
10.0000 mL | Freq: Once | INTRAVENOUS | Status: AC
  Administered 2020-09-28: 10 mL via INTRAVENOUS
  Filled 2020-09-28: qty 10

## 2020-09-28 NOTE — Progress Notes (Signed)
Symptom Management Consult Note [facility]  [phone] [phone]  Patient Care Team: Lavera Guise, MD as PCP - General (Internal Medicine) Telford Nab, RN as Registered Nurse   Name of the patient: Latoya Lynch  [id number]  [birth year]   Date of visit: 09/28/2020  Diagnosis: 1. 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, right ([place])    08/19/2020 Cancer Staging   Staging form: Lung, AJCC 8th Edition - Clinical stage from 08/19/2020: Stage IVB (cTX, cNX, pM1c) - Signed by Sindy Guadeloupe, MD on 08/19/2020    09/20/2020 -  Chemotherapy    Patient is on Treatment Plan: LUNG NSCLC CARBOPLATIN + PACLITAXEL + PEMBROLIZUMAB Q21D X 4 CYCLES / PEMBROLIZUMAB MAINTENANCE Q21D         Subjective Data:  ECOG: 1 - Symptomatic but completely ambulatory  HISTORY OF PRESENT ILLNESS  Latoya Lynch is an 80 year old female with PMH significant for 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 Janese Banks. Treatments began 07/19, and are Q21 days.   She is status post cycle 1 carbo/Taxol plus Keytruda given last on 09/20/2020.  INTERVAL HISTORY: She presents to the symptom management clinic today with c/o fatigue beginning over the weekend. The son-in law Latoya Lynch is present with the patient today, he has been very involved in her care, and is a good historian. The patient continues to have word loss.  Patient has not been eating and drinking well due to lack of energy and fatigue.  She has been sleeping a lot.  Reports 2-3 bowel  movements daily that are soft.  Denies diarrhea.  Her son-in-law is also concerned about her mood.  She is currently on Wellbutrin.   Review of Systems  Constitutional:  Positive for malaise/fatigue. Negative for fever and weight loss.       Denies fever at home, but has a low grade fever today  HENT:  Negative for congestion and hearing loss.   Eyes:  Negative for blurred vision and double vision.  Respiratory:  Negative for cough, shortness of breath and wheezing.   Cardiovascular:  Negative for chest pain and palpitations.  Gastrointestinal:  Negative for abdominal pain, constipation, diarrhea, nausea and vomiting.  Genitourinary:  Negative for frequency and urgency.  Skin:  Negative for rash.  Neurological:  Negative for dizziness, tingling, tremors, loss of consciousness, weakness and headaches.  Endo/Heme/Allergies:  Does not bruise/bleed easily.  Psychiatric/Behavioral:  Positive for depression and memory loss. The patient is not nervous/anxious and does not have insomnia.        Word searching    Past Medical History:  Diagnosis Date   Arthritis    COPD (chronic obstructive pulmonary disease) ([place])    Depression    History of methicillin resistant staphylococcus aureus (MRSA)    Hyperlipidemia    Lung mass    right lung   Osteoarthritis    Past Surgical History:  Procedure Laterality Date   CATARACT EXTRACTION W/ INTRAOCULAR LENS  IMPLANT, BILATERAL     CEREBRAL ANEURYSM REPAIR  01/2003   ELECTROMAGNETIC NAVIGATION BROCHOSCOPY Right 07/30/2018  Procedure: ELECTROMAGNETIC NAVIGATION BRONCHOSCOPY RIGHT;  Surgeon: Tyler Pita, MD;  Location: [facility] ORS;  Service: Cardiopulmonary;  Laterality: Right;   ENDOBRONCHIAL ULTRASOUND Right 07/30/2018   Procedure: ENDOBRONCHIAL ULTRASOUND RIGHT;  Surgeon: Tyler Pita, MD;  Location: [facility] ORS;  Service: Cardiopulmonary;  Laterality: Right;   PORTA CATH INSERTION N/A 09/16/2020   Procedure: PORTA CATH INSERTION;  Surgeon:  Algernon Huxley, MD;  Location: [place];  Service: Cardiovascular;  Laterality: N/A;   retinal detatchment Right 05/2017   TONSILLECTOMY     TOTAL HIP ARTHROPLASTY Right 01/2015   TOTAL HIP ARTHROPLASTY Left 05/2019   in [place]     Family History  Problem Relation Age of Onset   Heart attack Mother    Cancer Maternal Grandmother 80       spine ca   Heart attack Maternal Grandfather    Social History   Socioeconomic History   Marital status: Widowed    Spouse name: Not on file   Number of children: 3   Years of education: Not on file   Highest education level: Not on file  Occupational History   Not on file  Tobacco Use   Smoking status: Every Day    Packs/day: 0.25    Years: 60.00    Pack years: 15.00    Types: Cigarettes   Smokeless tobacco: Never   Tobacco comments:    about 5-10 cigarettes a day-11/18/2019 and working on decreasing and working towards quitting  Vaping Use   Vaping Use: Never used  Substance and Sexual Activity   Alcohol use: Not Currently    Comment: once in a great while   Drug use: Never   Sexual activity: Not Currently  Other Topics Concern   Not on file  Social History Narrative   Lives by herself with a Risk analyst    Social Determinants of Health   Financial Resource Strain: Not on file  Food Insecurity: Not on file  Transportation Needs: Not on file  Physical Activity: Not on file  Stress: Not on file  Social Connections: Not on file  Intimate Partner Violence: Not on file   Allergies  Allergen Reactions   Other Anaphylaxis    All nuts   Peanut Oil Anaphylaxis   Prochlorperazine Anaphylaxis   Current Outpatient Medications  Medication Instructions   buPROPion (WELLBUTRIN SR) 150 mg, Oral, 2 times daily   cetirizine (ZYRTEC) 10 mg, Daily   dexamethasone (DECADRON) 8 mg, Oral, Daily, Start the day after carboplatin chemotherapy for 3 days.   EPINEPHrine (EPI-PEN) 0.3 mg, Intramuscular, As needed    ipratropium-albuterol (DUONEB) 0.5-2.5 (3) MG/3ML SOLN USE 1 VIAL  IN  NEBULIZER TWICE  DAILY   lidocaine-prilocaine (EMLA) cream Apply to affected area once   LORazepam (ATIVAN) 0.5 mg, Oral, Every 6 hours PRN   methocarbamol (ROBAXIN) 750 mg, Daily   naproxen sodium (ALEVE) 440 mg, Oral, BH-each morning   Omega-3 1,000 mg, Oral, Daily   ondansetron (ZOFRAN) 8 mg, Oral, 2 times daily PRN, Start on day 3 after carboplatin chemo.   riboflavin (VITAMIN B-2) 200 mg, Oral, 2 times daily   rosuvastatin (CRESTOR) 5 mg, Oral, Daily   traMADol (ULTRAM) 50 mg, Oral, Every 8 hours PRN   vitamin B-12 (CYANOCOBALAMIN) 500 mcg, Oral, Daily   Objective:  Today's Vitals   09/28/20 1333  BP: (!) 109/57  Pulse: (!) 128  Resp: 18  Temp: (!) 100.8 F (38.2 C)  TempSrc: Tympanic  SpO2: 95%  Weight: 49 kg  PainSc: 3    Body mass index is 19.75 kg/m.  Filed Weights   09/28/20 1333  Weight: 49 kg    Physical Exam Vitals and nursing note reviewed.  HENT:     Head: Normocephalic and atraumatic.     Nose: No congestion.     Mouth/Throat:     Mouth: Mucous membranes are moist.     Pharynx: Oropharynx is clear.  Eyes:     General:        Right eye: No discharge.        Left eye: No discharge.     Extraocular Movements: Extraocular movements intact.     Pupils: Pupils are equal, round, and reactive to light.  Cardiovascular:     Rate and Rhythm: Normal rate and regular rhythm.     Pulses: Normal pulses.     Heart sounds: No murmur heard. Pulmonary:     Effort: Pulmonary effort is normal.  Abdominal:     General: Bowel sounds are normal. There is no distension.     Tenderness: There is no abdominal tenderness. There is no guarding.  Musculoskeletal:        General: No swelling or deformity.     Right lower leg: No edema.     Left lower leg: No edema.  Skin:    General: Skin is warm and dry.     Capillary Refill: Capillary refill takes less than 2 seconds.  Neurological:      Mental Status: She is alert and oriented to person, place, and time.  Psychiatric:        Mood and Affect: Mood normal.        Thought Content: Thought content normal.    ASSESSMENT & PLAN:  Stage IV lung CA with new Mets to Liver Initially discovered to have a 2.1 cm right lower lobe mass in March 2020 and had bronchoscopy which did not reveal malignancy although cytology was suspicious.  She underwent SBRT.  In August 2021 found to have possible recurrence prompting PET scan showing evidence of recurrence.  NGS testing showed PD-L1 of 91% without any other actionable mutations.  She is status post 1 cycle of CarboTaxol plus Keytruda last given on 09/20/2020.  Plan is for 4 cycles.  Fatigue- Symptoms started over the weekend. Low grade temperature (100.1) and tachycardia (113 bpm) on presentation. Her ROS/EXAM are otherwise unremarkable. Labs from 09/28/2020 show sodium level of 127, alkaline phosphatase 143, white count of 27.5 and hemoglobin of 10.4. Recommend IV fluids today and to recheck vital signs.   Pain Management- Currently prescribed tramadol 43m as needed every 8 hours/Effective   Mood Assessment- Patient reports sadness with isolation, and staying at home. Son in law is interested in a medication to help her with her mood, however patient is currently prescribed Wellbutrin, 1592m twice daily.  Hyponatremia- Secondary to dehydration versus SIADH. She will receive IV fluids while in clinic today. Continue to monitor.  Tachycardia- Secondary to dehydration Fluids as above.  Leukocytosis- Secondary to growth factor support.  Disposition- Patient will receive 1 L normal saline today while in clinic.  She would like to cancel her appointment for Friday.  I was reluctant to cancel her appointment but have asked that she call [facility] she feels she needs additional IV fluids.  Vital signs improved after IV fluids.  Heart rate and temperature were normal at discharge without  medication intervention.  Blood pressure was stable.  She  is scheduled to return to clinic to see Dr. Janese Banks for her next treatment in a couple weeks.  Patient and family given adequate time for questions. Concerns addressed. Stated understanding of treatment plan.   Instructed to call/RTC with worsening symptoms/fatigue/concern for dehydration.   The patient's diagnosis, an outline of the further diagnostic and laboratory studies which will be required, the recommendation for surgery, and alternatives were discussed with her and her accompanying family members.  All questions were answered to their satisfaction.  I personally had a face to face interaction and evaluated the patient jointly with the NP Student, Mrs. Benedetto Goad.  I have reviewed her history and available records and have performed the key portions of the physical exam including general, HEENT, abdominal exam, pelvic exam with my findings confirming those documented above by the APP student.  I have discussed the case with the APP student and the patient.  I agree with the above documentation, assessment and plan which was fully formulated by me.  Counseling was completed by me.    Benedetto Goad, Student FNP  I spent 45 minutes dedicated to the care of this patient (face-to-face and non-face-to-face) on the date of the encounter to include what is described in the assessment and plan.  Faythe Casa, NP 09/28/2020 2:55 PM

## 2020-09-28 NOTE — Progress Notes (Signed)
Patient here for oncology follow-up appointment, concerns of extreme fatigue since treatment, temp 100.8 HR 128.

## 2020-09-28 NOTE — Patient Instructions (Signed)
[facility] ONCOL[facility]  Discharge Instructions: Thank you for choosing [facility] to provide your oncology and hematology care.  If you have a lab appointment with the [facility], please go directly to the [facility] and check in at the registration area.  Wear comfortable clothing and clothing appropriate for easy access to any Portacath or PICC line.   We strive to give you quality time with your provider. You may need to reschedule your appointment if you arrive late (15 or more minutes).  Arriving late affects you and other patients whose appointments are after yours.  Also, if you miss three or more appointments without notifying the office, you may be dismissed from the clinic at the provider's discretion.      For prescription refill requests, have your pharmacy contact our office and allow 72 hours for refills to be completed.    Today you received IV fluids   To help prevent nausea and vomiting after your treatment, we encourage you to take your nausea medication as directed.  BELOW ARE SYMPTOMS THAT SHOULD BE REPORTED IMMEDIATELY: *FEVER GREATER THAN 100.4 F (38 C) OR HIGHER *CHILLS OR SWEATING *NAUSEA AND VOMITING THAT IS NOT CONTROLLED WITH YOUR NAUSEA MEDICATION *UNUSUAL SHORTNESS OF BREATH *UNUSUAL BRUISING OR BLEEDING *URINARY PROBLEMS (pain or burning when urinating, or frequent urination) *BOWEL PROBLEMS (unusual diarrhea, constipation, pain near the anus) TENDERNESS IN MOUTH AND THROAT WITH OR WITHOUT PRESENCE OF ULCERS (sore throat, sores in mouth, or a toothache) UNUSUAL RASH, SWELLING OR PAIN  UNUSUAL VAGINAL DISCHARGE OR ITCHING   Items with * indicate a potential emergency and should be followed up as soon as possible or go to the Emergency Department if any problems should occur.  Please show the CHEMOTHERAPY ALERT CARD or IMMUNOTHERAPY ALERT CARD at check-in to the Emergency Department and triage nurse.  Should you  have questions after your visit or need to cancel or reschedule your appointment, please contact [facility]  [phone] and follow the prompts.  Office hours are 8:00 a.m. to 4:30 p.m. Monday - Friday. Please note that voicemails left after 4:00 p.m. may not be returned until the following business day.  We are closed weekends and major holidays. You have access to a nurse at all times for urgent questions. Please call the main number to the clinic [phone] and follow the prompts.  For any non-urgent questions, you may also contact your provider using MyChart. We now offer e-Visits for anyone 24 and older to request care online for non-urgent symptoms. For details visit [email or web address].   Also download the MyChart app! Go to the app store, search "MyChart", open the app, select [facility], and log in with your MyChart username and password.  Due to Covid, a mask is required upon entering the hospital/clinic. If you do not have a mask, one will be given to you upon arrival. For doctor visits, patients may have 1 support person aged 108 or older with them. For treatment visits, patients cannot have anyone with them due to current Covid guidelines and our immunocompromised population.

## 2020-09-29 LAB — T4: T4, Total: 6.6 ug/dL (ref 4.5–12.0)

## 2020-09-30 ENCOUNTER — Inpatient Hospital Stay: Payer: Medicare Other | Admitting: Oncology

## 2020-09-30 ENCOUNTER — Inpatient Hospital Stay: Payer: Medicare Other

## 2020-10-01 ENCOUNTER — Other Ambulatory Visit: Payer: Self-pay

## 2020-10-01 ENCOUNTER — Emergency Department: Payer: Medicare Other

## 2020-10-01 ENCOUNTER — Emergency Department
Admission: EM | Admit: 2020-10-01 | Discharge: 2020-10-01 | Disposition: A | Discharge status: Home / Self Care | Payer: Medicare Other | Attending: Emergency Medicine | Admitting: Emergency Medicine

## 2020-10-01 DIAGNOSIS — F17210 Nicotine dependence, cigarettes, uncomplicated: Secondary | ICD-10-CM | POA: Diagnosis not present

## 2020-10-01 DIAGNOSIS — Z7951 Long term (current) use of inhaled steroids: Secondary | ICD-10-CM | POA: Insufficient documentation

## 2020-10-01 DIAGNOSIS — Z85118 Personal history of other malignant neoplasm of bronchus and lung: Secondary | ICD-10-CM | POA: Diagnosis not present

## 2020-10-01 DIAGNOSIS — Z91010 Allergy to peanuts: Secondary | ICD-10-CM | POA: Insufficient documentation

## 2020-10-01 DIAGNOSIS — J449 Chronic obstructive pulmonary disease, unspecified: Secondary | ICD-10-CM | POA: Diagnosis not present

## 2020-10-01 DIAGNOSIS — Z743 Need for continuous supervision: Secondary | ICD-10-CM | POA: Diagnosis not present

## 2020-10-01 DIAGNOSIS — R0902 Hypoxemia: Secondary | ICD-10-CM | POA: Diagnosis not present

## 2020-10-01 DIAGNOSIS — S32591A Other specified fracture of right pubis, initial encounter for closed fracture: Secondary | ICD-10-CM

## 2020-10-01 DIAGNOSIS — S0990XA Unspecified injury of head, initial encounter: Secondary | ICD-10-CM

## 2020-10-01 DIAGNOSIS — M25551 Pain in right hip: Secondary | ICD-10-CM | POA: Diagnosis not present

## 2020-10-01 DIAGNOSIS — R52 Pain, unspecified: Secondary | ICD-10-CM | POA: Diagnosis not present

## 2020-10-01 DIAGNOSIS — S79911A Unspecified injury of right hip, initial encounter: Secondary | ICD-10-CM | POA: Diagnosis present

## 2020-10-01 DIAGNOSIS — W01198A Fall on same level from slipping, tripping and stumbling with subsequent striking against other object, initial encounter: Secondary | ICD-10-CM | POA: Diagnosis not present

## 2020-10-01 DIAGNOSIS — Y92009 Unspecified place in unspecified non-institutional (private) residence as the place of occurrence of the external cause: Secondary | ICD-10-CM | POA: Insufficient documentation

## 2020-10-01 DIAGNOSIS — S32501A Unspecified fracture of right pubis, initial encounter for closed fracture: Secondary | ICD-10-CM | POA: Insufficient documentation

## 2020-10-01 DIAGNOSIS — Z96643 Presence of artificial hip joint, bilateral: Secondary | ICD-10-CM | POA: Diagnosis not present

## 2020-10-01 DIAGNOSIS — M79604 Pain in right leg: Secondary | ICD-10-CM | POA: Diagnosis not present

## 2020-10-01 DIAGNOSIS — W19XXXA Unspecified fall, initial encounter: Secondary | ICD-10-CM

## 2020-10-01 MED ORDER — ONDANSETRON 4 MG PO TBDP: 4.0000 mg | ORAL_TABLET | Freq: Four times a day (QID) | ORAL | 0 refills | Status: DC | PRN

## 2020-10-01 MED ORDER — HYDROCODONE-ACETAMINOPHEN 5-325 MG PO TABS
1.0000 | ORAL_TABLET | Freq: Once | ORAL | Status: AC
Start: 2020-10-01 — End: 2020-10-01
  Administered 2020-10-01: 1 via ORAL
  Filled 2020-10-01: qty 1

## 2020-10-01 MED ORDER — HYDROCODONE-ACETAMINOPHEN 5-325 MG PO TABS: 1.0000 | ORAL_TABLET | Freq: Four times a day (QID) | ORAL | 0 refills | Status: DC | PRN

## 2020-10-01 MED ORDER — ONDANSETRON 4 MG PO TBDP
4.0000 mg | ORAL_TABLET | Freq: Once | ORAL | Status: AC
  Administered 2020-10-01: 4 mg via ORAL
  Filled 2020-10-01: qty 1

## 2020-10-01 NOTE — ED Notes (Signed)
Latoya Lynch [phone] son -in-law

## 2020-10-01 NOTE — Discharge Instructions (Addendum)

## 2020-10-01 NOTE — ED Notes (Signed)
Pt assisted with bedpan to urinate

## 2020-10-01 NOTE — ED Notes (Signed)
Pt walked 10 feet with walker.  RN standby. Able to go from sitting to standing and sitting again wo assistance.

## 2020-10-01 NOTE — ED Triage Notes (Signed)
Pt states is receiving chemo. Pt states she fell while opening a door today. Pt complains  of pain from right lower femur to groin. Pt denies syncope.

## 2020-10-01 NOTE — ED Notes (Signed)
Pt updated on wait.  °

## 2020-10-01 NOTE — ED Provider Notes (Signed)
[facility] Emergency Department Provider Note ____________________________________________   Event Date/Time   First MD Initiated Contact with Patient 10/01/20 [id number]     (approximate)  I have reviewed the triage vital signs and the nursing notes.   HISTORY  Chief Complaint Fall    HPI Latoya Lynch is a 80 y.o. female with history of COPD, hyperlipidemia, lung cancer on chemotherapy who presents to the emergency department after she fell.  States she was opening a door and lost her balance and fell in the living room floor.  States she did hit her head but did not lose consciousness.  She is not on any antiplatelets or anticoagulants.  She is complaining of right hip pain but has been able to ambulate.  States she normally ambulates with a cane.  No neck or back pain.  No chest or abdominal pain.  No numbness or weakness.  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, right ([place]) 08/19/2020   Goals of care, counseling/discussion 08/19/2020   Osteoarthritis of left hip 07/26/2020   Bilateral cataracts 07/26/2020   Disorder of lower respiratory system 07/26/2020   History of surgery 07/26/2020   Intracranial aneurysm 07/26/2020   Moderate aortic valve regurgitation 07/26/2020   History of total hip arthroplasty 06/02/2019   Abnormal gait 03/30/2019   Hip pain 03/30/2019   Chronic obstructive lung disease ([place]) 03/27/2018   Tobacco dependence due to cigarettes 03/27/2018   Mass of lower lobe of right lung 12/19/2017   Retinal detachment 05/16/2017    Past Surgical History:   Procedure Laterality Date   CATARACT EXTRACTION W/ INTRAOCULAR LENS  IMPLANT, BILATERAL     CEREBRAL ANEURYSM REPAIR  01/2003   ELECTROMAGNETIC NAVIGATION BROCHOSCOPY Right 07/30/2018   Procedure: ELECTROMAGNETIC NAVIGATION BRONCHOSCOPY RIGHT;  Surgeon: Tyler Pita, MD;  Location: [facility] ORS;  Service: Cardiopulmonary;  Laterality: Right;   ENDOBRONCHIAL ULTRASOUND Right 07/30/2018   Procedure: ENDOBRONCHIAL ULTRASOUND RIGHT;  Surgeon: Tyler Pita, MD;  Location: [facility] ORS;  Service: Cardiopulmonary;  Laterality: Right;   PORTA CATH INSERTION N/A 09/16/2020   Procedure: PORTA CATH INSERTION;  Surgeon: Algernon Huxley, MD;  Location: [facility];  Service: Cardiovascular;  Laterality: N/A;   retinal detatchment Right 05/2017   TONSILLECTOMY     TOTAL HIP ARTHROPLASTY Right 01/2015   TOTAL HIP ARTHROPLASTY Left 05/2019   in [place]      Prior to Admission medications   Medication Sig Start Date End Date Taking? Authorizing Provider  HYDROcodone-acetaminophen (NORCO/VICODIN) 5-325 MG tablet Take 1 tablet by mouth every 6 (six) hours as needed. 10/01/20  Yes Pietra Zuluaga, Cyril Mourning N, DO  ondansetron (ZOFRAN ODT) 4 MG disintegrating tablet Take 1 tablet (4 mg total) by mouth every 6 (six) hours as needed for nausea or vomiting. 10/01/20  Yes Nia Nathaniel, Delice Bison, DO  buPROPion (WELLBUTRIN SR) 150 MG 12 hr tablet Take 150 mg by mouth 2 (two) times daily.  Patient not taking: Reported on 09/28/2020    [provider]  cetirizine (ZYRTEC) 10 MG tablet Take 10 mg by mouth daily. Patient not taking: No sig reported    [provider]  dexamethasone (DECADRON) 4 MG tablet Take 2 tablets (8 mg total) by mouth daily. Start the day after carboplatin chemotherapy for 3 days. Patient not taking: Reported on 09/28/2020 09/15/20   Sindy Guadeloupe, MD  EPINEPHrine 0.3 mg/0.3 mL IJ SOAJ injection Inject 0.3 mg into the muscle as needed for anaphylaxis.    [provider]  ipratropium-albuterol (DUONEB) 0.5-2.5 (3) MG/3ML SOLN USE 1 VIAL  IN  NEBULIZER TWICE  DAILY Patient not taking: No sig reported 01/18/20   Lavera Guise, MD  lidocaine-prilocaine (EMLA) cream Apply to affected area once Patient not taking: Reported on 09/28/2020 09/15/20   Sindy Guadeloupe, MD  LORazepam (ATIVAN) 0.5 MG tablet Take 1 tablet (0.5 mg total) by mouth every 6 (six) hours as needed (Nausea or vomiting). Patient not taking: No sig reported 09/15/20   Sindy Guadeloupe, MD  methocarbamol (ROBAXIN) 750 MG tablet Take 750 mg by mouth daily. Patient not taking: No sig reported    [provider]  naproxen sodium (ALEVE) 220 MG tablet Take 440 mg by mouth every morning.  Patient not taking: Reported on 09/28/2020    [provider]  Omega-3 1000 MG CAPS Take 1,000 mg by mouth daily.  Patient not taking: Reported on 09/28/2020    [provider]  ondansetron (ZOFRAN) 8 MG tablet Take 1 tablet (8 mg total) by mouth 2 (two) times daily as needed for refractory nausea / vomiting. Start on day 3 after carboplatin chemo. Patient not taking: Reported on 09/28/2020 09/15/20   Sindy Guadeloupe, MD  riboflavin (VITAMIN B-2) 100 MG TABS tablet Take 200 mg by mouth in the morning and at bedtime. Patient not taking: Reported on 09/28/2020    [provider]  rosuvastatin (CRESTOR) 5 MG tablet Take 1 tablet (5 mg total) by mouth daily. Patient not taking: No sig reported 08/26/20   Lavera Guise, MD  traMADol (ULTRAM) 50 MG tablet Take 1 tablet (50 mg total) by mouth every 8 (eight) hours as needed. 09/20/20   Sindy Guadeloupe, MD  vitamin B-12 (CYANOCOBALAMIN) 500 MCG tablet Take 500 mcg by mouth daily.    [provider]    Allergies Other, Peanut oil, and Prochlorperazine  Family History  Problem Relation Age of Onset   Heart attack Mother    Cancer Maternal Grandmother 80       spine ca   Heart attack Maternal Grandfather     Social  History Social History   Tobacco Use   Smoking status: Every Day    Packs/day: 0.25    Years: 60.00    Pack years: 15.00    Types: Cigarettes   Smokeless tobacco: Never   Tobacco comments:    about 5-10 cigarettes a day-11/18/2019 and working on decreasing and working towards quitting  Vaping Use   Vaping Use: Never used  Substance Use Topics   Alcohol use: Not Currently    Comment: once in a great while   Drug use: Never    Review of Systems Constitutional: No fever. Eyes: No visual changes. ENT: No sore throat. Cardiovascular: Denies chest pain. Respiratory: Denies shortness of breath. Gastrointestinal: No nausea, vomiting, diarrhea. Genitourinary: Negative for dysuria. Musculoskeletal: Negative for back pain. Skin: Negative for rash. Neurological: Negative for focal weakness or numbness.   ____________________________________________   PHYSICAL EXAM:  VITAL  SIGNS: ED Triage Vitals  Enc Vitals Group     BP 10/01/20 0036 112/63     Pulse Rate 10/01/20 0036 (!) 104     Resp 10/01/20 0036 18     Temp 10/01/20 0036 98.9 F (37.2 C)     Temp Source 10/01/20 0542 Oral     SpO2 10/01/20 0036 96 %     Weight 10/01/20 0037 105 lb 13.1 oz (48 kg)     Height 10/01/20 0037 5\' 2"  (1.575 m)     Head Circumference --      Peak Flow --      Pain Score 10/01/20 0036 8     Pain Loc --      Pain Edu? --      Excl. in GC? --    CONSTITUTIONAL: Alert and oriented. Well-appearing; well-nourished; GCS 15, elderly, in no distress, has some difficulty answering questions which she reports is chronic HEAD: Normocephalic; atraumatic EYES: Conjunctivae clear, PERRL, EOMI ENT: normal nose; no rhinorrhea; moist mucous membranes; pharynx without lesions noted; no dental injury; no septal hematoma NECK: Supple, no meningismus, no LAD; no midline spinal tenderness, step-off or deformity; trachea midline CARD: RRR; S1 and S2 appreciated; no murmurs, no clicks, no rubs, no gallops RESP:  Normal chest excursion without splinting or tachypnea; breath sounds clear and equal bilaterally; no wheezes, no rhonchi, no rales; no hypoxia or respiratory distress CHEST:  chest wall stable, no crepitus or ecchymosis or deformity, nontender to palpation; no flail chest ABD/GI: Normal bowel sounds; non-distended; soft, non-tender, no rebound, no guarding; no ecchymosis or other lesions noted PELVIS:  stable, tender to palpation over the right anterior hip and pelvis without leg length discrepancy or other deformity BACK:  The back appears normal and is non-tender to palpation, there is no CVA tenderness; no midline spinal tenderness, step-off or deformity EXT: Normal ROM in all joints; non-tender to palpation; no edema; normal capillary refill; no cyanosis, no bony tenderness or bony deformity of patient's extremities, no joint effusion, compartments are soft, extremities are warm and well-perfused, no ecchymosis, 2+ DP pulses bilaterally SKIN: Normal color for age and race; warm NEURO: Moves all extremities equally, normal sensation diffusely, cranial nerves II through XII intact, normal speech other than chronic aphasia PSYCH: The patient's mood and manner are appropriate. Grooming and personal hygiene are appropriate.  ____________________________________________   LABS (all labs ordered are listed, but only abnormal results are displayed)  Labs Reviewed - No data to display ____________________________________________  EKG   ____________________________________________  RADIOLOGY I, Kenlea Woodell, personally viewed and evaluated these images (plain radiographs) as part of my medical decision making, as well as reviewing the written report by the radiologist.  ED MD interpretation: Patient has an inferior right pubic ramus fracture.  Official radiology report(s): CT Head Wo Contrast  Result Date: 10/01/2020 CLINICAL DATA:  Fall. EXAM: CT HEAD WITHOUT CONTRAST TECHNIQUE: Contiguous  axial images were obtained from the base of the skull through the vertex without intravenous contrast. COMPARISON:  MRI brain 09/01/2018 FINDINGS: Brain: No evidence of acute infarction, hemorrhage, hydrocephalus, extra-axial collection or mass lesion/mass effect. Prominence of the sulci and ventricles compatible with brain atrophy. Again seen are remote small infarcts involving the left superior frontal gyrus and posterior aspect of the right gyrus rectus. There is mild diffuse low-attenuation within the subcortical and periventricular white matter compatible with chronic microvascular disease. Signs of previous right frontal craniotomy with underlying dural thickening and calcifications. Vascular: Right MCA aneurysm clip. No  hyperdense vessel or unexpected calcification. Skull: Previous right frontal craniotomy. Negative for acute fracture or focal lesion. Sinuses/Orbits: Paranasal sinuses appear clear. Mastoid air cells are also clear. Other: None IMPRESSION: 1. No acute intracranial abnormalities. 2. Chronic small vessel ischemic disease and brain atrophy. 3. 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 4. Signs of previous right frontal craniotomy with underlying dural thickening and calcifications. Electronically Signed   By: Kerby Moors M.D.   On: 10/01/2020 06:16   CT Hip Right Wo Contrast  Result Date: 10/01/2020 CLINICAL DATA:  80 year old female status post fall with right hip pain. Hip arthroplasties. EXAM: CT OF THE RIGHT HIP WITHOUT CONTRAST TECHNIQUE: Multidetector CT imaging of the right hip was performed according to the standard protocol. Multiplanar CT image reconstructions were also generated. COMPARISON:  Right hip radiographs 0114 hours today. CT Abdomen and Pelvis 11/25/2017. FINDINGS: Advanced chronic iliac artery calcified atherosclerosis. Proximal femoral artery calcified plaque. Vascular patency is not evaluated in  the absence of IV contrast. No lymphadenopathy. Negative visible lower abdominal and pelvic viscera aside from distended urinary bladder and retained stool in the rectum. No pelvic free fluid. Osteopenia. Chronic bipolar right hip arthroplasty. Subtle nondisplaced fracture of the right inferior pubic ramus suspected on series 2, image 70 and series 4, image 45, appears new since 2019. Left acetabulum and other visible right hemipelvis appear stable and intact. Right hip arthroplasty remains normally located. No evidence of loosening. Streak artifact in the proximal femur. No acute fracture of the visible proximal femur identified. No definite right hip region soft tissue injury. IMPRESSION: 1. Osteopenia. Subtle nondisplaced fracture of the right inferior pubic ramus is suspected. 2. No other acute fracture identified about the right hip or hemipelvis. Stable bipolar right hip arthroplasty with no adverse features. 3. Advanced calcified atherosclerosis.  Distended urinary bladder. Electronically Signed   By: Genevie Ann M.D.   On: 10/01/2020 06:17   DG Hip Unilat  With Pelvis 2-3 Views Right  Result Date: 10/01/2020 CLINICAL DATA:  Fall, right hip pain EXAM: DG HIP (WITH OR WITHOUT PELVIS) 2-3V RIGHT COMPARISON:  None. FINDINGS: Normal alignment. No acute fracture or dislocation. Bilateral total hip arthroplasty has been performed. Vascular calcifications are noted within the a pelvis and medial thighs bilaterally. IMPRESSION: No acute fracture or dislocation. Electronically Signed   By: Fidela Salisbury MD   On: 10/01/2020 01:36   DG Femur Min 2 Views Right  Result Date: 10/01/2020 CLINICAL DATA:  Fall, right leg pain EXAM: RIGHT FEMUR 2 VIEWS COMPARISON:  None. FINDINGS: Right total hip arthroplasty has been performed. Normal alignment. No fracture or dislocation. Vascular calcifications are noted within the right thigh. Limited evaluation of the right knee demonstrates at least mild degenerative change.  IMPRESSION: No acute fracture or dislocation. Electronically Signed   By: Fidela Salisbury MD   On: 10/01/2020 01:37    ____________________________________________   PROCEDURES  Procedure(s) performed (including Critical Care):  Procedures   ____________________________________________   INITIAL IMPRESSION / ASSESSMENT AND PLAN / ED COURSE  As part of my medical decision making, I reviewed the following data within the [place] History obtained from family, Nursing notes reviewed and incorporated, Old chart reviewed, Radiograph reviewed , Notes from prior ED visits, and  Controlled Substance Database         Patient here with mechanical fall.  X-rays obtained in triage showed no acute abnormality.  Patient still complaining of  right hip pain and she states she did hit her head.  Will obtain CT of the head as well as the right hip.  Will provide with pain medication.  ED PROGRESS  CT head shows no acute abnormality.  CT of the hip shows nondisplaced right inferior pubic ramus fracture.  She has been able to ambulate here with a walker without difficulty.  Reports she has tramadol at home and took this prior to coming in but it did not help her pain.  She states that the hydrocodone that she was provided with in the ED seem to help her more.  Will discharge with prescription of hydrocodone but have advised her to not take this with tramadol.  She has a PCP for outpatient follow-up.  I feel she is safe to be discharged home.  We discussed this is a nonoperative injury and she is able to weight-bear as tolerated.  Patient and family at bedside are comfortable with this plan.  At this time, I do not feel there is any life-threatening condition present. I have reviewed, interpreted and discussed all results (EKG, imaging, lab, urine as appropriate) and exam findings with patient/family. I have reviewed nursing notes and appropriate previous records.  I feel the patient is  safe to be discharged home without further emergent workup and can continue workup as an outpatient as needed. Discussed usual and customary return precautions. Patient/family verbalize understanding and are comfortable with this plan.  Outpatient follow-up has been provided as needed. All questions have been answered.    ____________________________________________   FINAL CLINICAL IMPRESSION(S) / ED DIAGNOSES  Final diagnoses:  Fall, initial encounter  Injury of head, initial encounter  Closed fracture of right inferior pubic ramus, initial encounter [facility])     ED Discharge Orders          Ordered    HYDROcodone-acetaminophen (NORCO/VICODIN) 5-325 MG tablet  Every 6 hours PRN        10/01/20 0701    ondansetron (ZOFRAN ODT) 4 MG disintegrating tablet  Every 6 hours PRN        10/01/20 0701    Walker rolling        10/01/20 0701            *Please note:  Latoya Lynch was evaluated in Emergency Department on 10/01/2020 for the symptoms described in the history of present illness. She was evaluated in the context of the global COVID-19 pandemic, which necessitated consideration that the patient might be at risk for infection with the SARS-CoV-2 virus that causes COVID-19. Institutional protocols and algorithms that pertain to the evaluation of patients at risk for COVID-19 are in a state of rapid change based on information released by regulatory bodies including the CDC and federal and state organizations. These policies and algorithms were followed during the patient's care in the ED.  Some ED evaluations and interventions may be delayed as a result of limited staffing during and the pandemic.*   Note:  This document was prepared using Dragon voice recognition software and may include unintentional dictation errors.    Jacque Garrels, Delice Bison, DO 10/01/20 [phone]

## 2020-10-06 ENCOUNTER — Telehealth: Payer: Self-pay | Admitting: Student

## 2020-10-06 NOTE — Telephone Encounter (Signed)
Received call from patient's son in law. He is asking about additional care in the home. He states patient has had a couple of falls since she was seen in the home. Discussed options; will forward a list of home care options.

## 2020-10-07 ENCOUNTER — Telehealth: Payer: Self-pay

## 2020-10-07 NOTE — Telephone Encounter (Signed)
Left vm to schedule hospital follow up visit-Toni

## 2020-10-10 ENCOUNTER — Inpatient Hospital Stay: Payer: Medicare Other | Attending: Oncology | Admitting: Hospice and Palliative Medicine

## 2020-10-10 ENCOUNTER — Telehealth: Payer: Self-pay

## 2020-10-10 ENCOUNTER — Other Ambulatory Visit: Payer: Self-pay

## 2020-10-10 DIAGNOSIS — R531 Weakness: Secondary | ICD-10-CM | POA: Diagnosis not present

## 2020-10-10 DIAGNOSIS — M8580 Other specified disorders of bone density and structure, unspecified site: Secondary | ICD-10-CM | POA: Insufficient documentation

## 2020-10-10 DIAGNOSIS — Z79899 Other long term (current) drug therapy: Secondary | ICD-10-CM | POA: Insufficient documentation

## 2020-10-10 DIAGNOSIS — C787 Secondary malignant neoplasm of liver and intrahepatic bile duct: Secondary | ICD-10-CM | POA: Insufficient documentation

## 2020-10-10 DIAGNOSIS — Z5112 Encounter for antineoplastic immunotherapy: Secondary | ICD-10-CM | POA: Insufficient documentation

## 2020-10-10 DIAGNOSIS — E876 Hypokalemia: Secondary | ICD-10-CM | POA: Insufficient documentation

## 2020-10-10 DIAGNOSIS — E785 Hyperlipidemia, unspecified: Secondary | ICD-10-CM | POA: Insufficient documentation

## 2020-10-10 DIAGNOSIS — C3431 Malignant neoplasm of lower lobe, right bronchus or lung: Secondary | ICD-10-CM | POA: Insufficient documentation

## 2020-10-10 DIAGNOSIS — J449 Chronic obstructive pulmonary disease, unspecified: Secondary | ICD-10-CM | POA: Insufficient documentation

## 2020-10-10 DIAGNOSIS — E871 Hypo-osmolality and hyponatremia: Secondary | ICD-10-CM | POA: Insufficient documentation

## 2020-10-10 DIAGNOSIS — F17210 Nicotine dependence, cigarettes, uncomplicated: Secondary | ICD-10-CM | POA: Insufficient documentation

## 2020-10-10 DIAGNOSIS — Z7952 Long term (current) use of systemic steroids: Secondary | ICD-10-CM | POA: Insufficient documentation

## 2020-10-10 NOTE — Telephone Encounter (Signed)
error 

## 2020-10-10 NOTE — Progress Notes (Signed)
Virtual Visit via Video Note  I connected with Latoya Lynch on 10/10/20 at 10:30 AM EDT by a video enabled telemedicine application and verified that I am speaking with the correct person using two identifiers.  Location: Patient: Home Provider: Clinic   I discussed the limitations of evaluation and management by telemedicine and the availability of in person appointments. The patient expressed understanding and agreed to proceed.  History of Present Illness: Latoya Lynch is a 80 y.o. 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  Patient has had falls and word searching.  She was referred to palliative care to help address goals and manage ongoing symptoms.   Observations/Objective: Since patient was last seen in clinic, she had a full and presented the ER on 10/01/2020.  Patient was found to have a nondisplaced pelvic fracture and was started on Norco.  Today, patient reports that she is doing better.  She denies falls and states that her weakness is improving.  Family purchased a walker which patient has been using for stability.  Family are available in assisting with her daily care.  Patient remains independent with dressing and toileting but bathing is concerning due to risk for falls.  Discussed utilization of a shower chair and will order.  We will also send referral for rehab screening to [facility].   Symptomatically, she seems to be doing better.  She denies any distressing symptoms at present.  She reports pain is stable and patient has not required pain medication in the past couple of days.  Assessment and Plan: Weakness -referral for rehab screening. Shower chair. Continue use of walker in the home.   Neoplasm related pain -continue as needed Norco.  Follow Up Instructions: MyChart visit 1 month   I discussed the assessment and treatment plan with the patient. The patient was provided an  opportunity to ask questions and all were answered. The patient agreed with the plan and demonstrated an understanding of the instructions.   The patient was advised to call back or seek an in-person evaluation if the symptoms worsen or if the condition fails to improve as anticipated.  I provided 15 minutes of non-face-to-face time during this encounter.   Irean Hong, NP

## 2020-10-11 ENCOUNTER — Inpatient Hospital Stay: Payer: Medicare Other

## 2020-10-11 ENCOUNTER — Inpatient Hospital Stay (HOSPITAL_BASED_OUTPATIENT_CLINIC_OR_DEPARTMENT_OTHER): Payer: Medicare Other | Admitting: Oncology

## 2020-10-11 ENCOUNTER — Encounter: Payer: Self-pay | Admitting: Oncology

## 2020-10-11 VITALS — BP 112/68 | HR 98 | Temp 97.6°F | Resp 16 | Ht 62.0 in | Wt 103.5 lb

## 2020-10-11 DIAGNOSIS — Z95828 Presence of other vascular implants and grafts: Secondary | ICD-10-CM

## 2020-10-11 DIAGNOSIS — C3491 Malignant neoplasm of unspecified part of right bronchus or lung: Secondary | ICD-10-CM

## 2020-10-11 DIAGNOSIS — Z5112 Encounter for antineoplastic immunotherapy: Secondary | ICD-10-CM | POA: Diagnosis not present

## 2020-10-11 DIAGNOSIS — J449 Chronic obstructive pulmonary disease, unspecified: Secondary | ICD-10-CM | POA: Diagnosis not present

## 2020-10-11 DIAGNOSIS — Z7952 Long term (current) use of systemic steroids: Secondary | ICD-10-CM | POA: Diagnosis not present

## 2020-10-11 DIAGNOSIS — E876 Hypokalemia: Secondary | ICD-10-CM

## 2020-10-11 DIAGNOSIS — E871 Hypo-osmolality and hyponatremia: Secondary | ICD-10-CM | POA: Diagnosis not present

## 2020-10-11 DIAGNOSIS — Z79899 Other long term (current) drug therapy: Secondary | ICD-10-CM | POA: Diagnosis not present

## 2020-10-11 DIAGNOSIS — C787 Secondary malignant neoplasm of liver and intrahepatic bile duct: Secondary | ICD-10-CM | POA: Diagnosis not present

## 2020-10-11 DIAGNOSIS — E785 Hyperlipidemia, unspecified: Secondary | ICD-10-CM | POA: Diagnosis not present

## 2020-10-11 DIAGNOSIS — C3431 Malignant neoplasm of lower lobe, right bronchus or lung: Secondary | ICD-10-CM | POA: Diagnosis not present

## 2020-10-11 DIAGNOSIS — M8580 Other specified disorders of bone density and structure, unspecified site: Secondary | ICD-10-CM | POA: Diagnosis not present

## 2020-10-11 DIAGNOSIS — F17210 Nicotine dependence, cigarettes, uncomplicated: Secondary | ICD-10-CM | POA: Diagnosis not present

## 2020-10-11 LAB — CBC WITH DIFFERENTIAL/PLATELET
Abs Immature Granulocytes: 0.06 10*3/uL (ref 0.00–0.07)
Basophils Absolute: 0.1 10*3/uL (ref 0.0–0.1)
Basophils Relative: 1 %
Eosinophils Absolute: 0.2 10*3/uL (ref 0.0–0.5)
Eosinophils Relative: 2 %
HCT: 32.4 % — ABNORMAL LOW (ref 36.0–46.0)
Hemoglobin: 10.5 g/dL — ABNORMAL LOW (ref 12.0–15.0)
Immature Granulocytes: 1 %
Lymphocytes Relative: 15 %
Lymphs Abs: 1.4 10*3/uL (ref 0.7–4.0)
MCH: 28 pg (ref 26.0–34.0)
MCHC: 32.4 g/dL (ref 30.0–36.0)
MCV: 86.4 fL (ref 80.0–100.0)
Monocytes Absolute: 0.6 10*3/uL (ref 0.1–1.0)
Monocytes Relative: 7 %
Neutro Abs: 6.7 10*3/uL (ref 1.7–7.7)
Neutrophils Relative %: 74 %
Platelets: 473 10*3/uL — ABNORMAL HIGH (ref 150–400)
RBC: 3.75 MIL/uL — ABNORMAL LOW (ref 3.87–5.11)
RDW: 18.5 % — ABNORMAL HIGH (ref 11.5–15.5)
WBC: 9 10*3/uL (ref 4.0–10.5)
nRBC: 0 % (ref 0.0–0.2)

## 2020-10-11 LAB — COMPREHENSIVE METABOLIC PANEL
ALT: 13 U/L (ref 0–44)
AST: 22 U/L (ref 15–41)
Albumin: 3 g/dL — ABNORMAL LOW (ref 3.5–5.0)
Alkaline Phosphatase: 125 U/L (ref 38–126)
Anion gap: 11 (ref 5–15)
BUN: 9 mg/dL (ref 8–23)
CO2: 24 mmol/L (ref 22–32)
Calcium: 8.5 mg/dL — ABNORMAL LOW (ref 8.9–10.3)
Chloride: 99 mmol/L (ref 98–111)
Creatinine, Ser: 0.59 mg/dL (ref 0.44–1.00)
GFR, Estimated: >60 mL/min (ref >60)
Glucose, Bld: 121 mg/dL — ABNORMAL HIGH (ref 70–99)
Potassium: 3.1 mmol/L — ABNORMAL LOW (ref 3.5–5.1)
Sodium: 134 mmol/L — ABNORMAL LOW (ref 135–145)
Total Bilirubin: 0.5 mg/dL (ref 0.3–1.2)
Total Protein: 6.2 g/dL — ABNORMAL LOW (ref 6.5–8.1)

## 2020-10-11 MED ORDER — SODIUM CHLORIDE 0.9 % IV SOLN
Freq: Once | INTRAVENOUS | Status: AC
  Filled 2020-10-11: qty 250

## 2020-10-11 MED ORDER — SODIUM CHLORIDE 0.9 % IV SOLN
10.0000 mg | Freq: Once | INTRAVENOUS | Status: DC
  Filled 2020-10-11: qty 1

## 2020-10-11 MED ORDER — HEPARIN SOD (PORK) LOCK FLUSH 100 UNIT/ML IV SOLN
INTRAVENOUS | Status: AC
  Filled 2020-10-11: qty 5

## 2020-10-11 MED ORDER — POTASSIUM CHLORIDE IN NACL 20-0.9 MEQ/L-% IV SOLN
Freq: Once | INTRAVENOUS | Status: AC
  Filled 2020-10-11: qty 1000

## 2020-10-11 MED ORDER — SODIUM CHLORIDE 0.9 % IV SOLN
10.0000 mg | Freq: Once | INTRAVENOUS | Status: AC
  Administered 2020-10-11: 10 mg via INTRAVENOUS
  Filled 2020-10-11: qty 1

## 2020-10-11 MED ORDER — SODIUM CHLORIDE 0.9 % IV SOLN
200.0000 mg | Freq: Once | INTRAVENOUS | Status: AC
  Administered 2020-10-11: 200 mg via INTRAVENOUS
  Filled 2020-10-11: qty 8

## 2020-10-11 MED ORDER — SODIUM CHLORIDE 0.9% FLUSH
10.0000 mL | Freq: Once | INTRAVENOUS | Status: AC
  Administered 2020-10-11: 10 mL via INTRAVENOUS
  Filled 2020-10-11: qty 10

## 2020-10-11 MED ORDER — HEPARIN SOD (PORK) LOCK FLUSH 100 UNIT/ML IV SOLN
500.0000 [IU] | Freq: Once | INTRAVENOUS | Status: AC | PRN
  Administered 2020-10-11: 500 [IU]
  Filled 2020-10-11: qty 5

## 2020-10-11 NOTE — Progress Notes (Signed)
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  N March 2020 and she underwent a bronchoscopy which did not reveal any malignancy.  Cytology was suspicious but not diagnostic for malignancy.  She then underwent SBRT for the same.  Subsequently in August 2021 she was found to have an endoluminal filling defect in the area of the right hilum.  She again underwent empiric radiation to this area which was hypermetabolic on PET CT scan measuring 2.4 cm.  She has not required any systemic chemotherapy so far    Recent surveillance CT scan in May 2022 showed concerning liver metastases and was followed by a PET scan.  PET scan showed multiple FDG avid metastases in the lateral segment of the left hepatic lobe measuring up to 3.9 cm.  No evidence of recurrent tumor was seen in the lung.  No other evidence of metastatic disease.     NGS testing showed PD-L1 of 95% but no other actionable mutations.Tumor positive for T p53 and NF P2 [id number]   Plan is to proceed with 4 cycles of CarboTaxol Keytruda if tolerated followed by maintenance Keytruda.  Patient tolerated cycle 1 of chemotherapy poorly with worsening fatigue as well as a fall which landed up with an ER visit.  Plan is therefore to proceed with single agent Keytruda alone    Interval history-patient felt significantly fatigued for 2 days after chemotherapy to the point that she could not get up from  her chair or bed on her own and had to be carried out.  She then had a fall 4 days after chemotherapy and was in the ER.  No fractures.  She was subsequently seen in symptom management clinic as well for dehydration.  She had stopped taking her Wellbutrin when she was diagnosed with liver metastases and now has started back on and reports improvement in her mood after she started taking it back.  She continues to have word finding difficulty  ECOG PS- 2 Pain scale- 0  Review of systems- Review of Systems  Constitutional:  Positive for malaise/fatigue. Negative for chills, fever and weight loss.  HENT:  Negative for congestion, ear discharge and nosebleeds.   Eyes:  Negative for blurred vision.  Respiratory:  Negative for cough, hemoptysis, sputum production, shortness of breath and wheezing.   Cardiovascular:  Negative for chest pain, palpitations, orthopnea and claudication.  Gastrointestinal:  Negative for abdominal pain, blood in stool, constipation, diarrhea, heartburn, melena, nausea and vomiting.  Genitourinary:  Negative for dysuria, flank pain, frequency, hematuria and urgency.  Musculoskeletal:  Negative for back pain, joint pain and myalgias.  Skin:  Negative for rash.  Neurological:  Negative for dizziness, tingling, focal weakness, seizures, weakness and headaches.  Endo/Heme/Allergies:  Does not bruise/bleed easily.  Psychiatric/Behavioral:  Negative for depression and suicidal ideas. The patient does not have insomnia.       Allergies  Allergen Reactions   Other Anaphylaxis    All nuts  Peanut Oil Anaphylaxis   Prochlorperazine Anaphylaxis     Past Medical History:  Diagnosis Date   Arthritis    COPD (chronic obstructive pulmonary disease) (HCC)    Depression    History of methicillin resistant staphylococcus aureus (MRSA)    Hyperlipidemia    Lung cancer ([place])    Lung mass    right lung   Osteoarthritis      Past Surgical History:  Procedure Laterality  Date   CATARACT EXTRACTION W/ INTRAOCULAR LENS  IMPLANT, BILATERAL     CEREBRAL ANEURYSM REPAIR  01/2003   ELECTROMAGNETIC NAVIGATION BROCHOSCOPY Right 07/30/2018   Procedure: ELECTROMAGNETIC NAVIGATION BRONCHOSCOPY RIGHT;  Surgeon: Tyler Pita, MD;  Location: [facility] ORS;  Service: Cardiopulmonary;  Laterality: Right;   ENDOBRONCHIAL ULTRASOUND Right 07/30/2018   Procedure: ENDOBRONCHIAL ULTRASOUND RIGHT;  Surgeon: Tyler Pita, MD;  Location: [facility] ORS;  Service: Cardiopulmonary;  Laterality: Right;   PORTA CATH INSERTION N/A 09/16/2020   Procedure: PORTA CATH INSERTION;  Surgeon: Algernon Huxley, MD;  Location: [place] [facility];  Service: Cardiovascular;  Laterality: N/A;   retinal detatchment Right 05/2017   TONSILLECTOMY     TOTAL HIP ARTHROPLASTY Right 01/2015   TOTAL HIP ARTHROPLASTY Left 05/2019   in [place] History   Socioeconomic History   Marital status: Widowed    Spouse name: Not on file   Number of children: 3   Years of education: Not on file   Highest education level: Not on file  Occupational History   Not on file  Tobacco Use   Smoking status: Every Day    Packs/day: 0.25    Years: 60.00    Pack years: 15.00    Types: Cigarettes   Smokeless tobacco: Never   Tobacco comments:    about 5-10 cigarettes a day-11/18/2019 and working on decreasing and working towards quitting  Vaping Use   Vaping Use: Never used  Substance and Sexual Activity   Alcohol use: Not Currently   Drug use: Never   Sexual activity: Not Currently  Other Topics Concern   Not on file  Social History Narrative   Lives by herself with a Risk analyst    Social Determinants of Health   Financial Resource Strain: Not on file  Food Insecurity: Not on file  Transportation Needs: Not on file  Physical Activity: Not on file  Stress: Not on file  Social Connections: Not on file  Intimate Partner Violence: Not on file    Family History  Problem  Relation Age of Onset   Heart attack Mother    Cancer Maternal Grandmother 80       spine ca   Heart attack Maternal Grandfather      Current Outpatient Medications:    buPROPion (WELLBUTRIN SR) 150 MG 12 hr tablet, Take 150 mg by mouth 2 (two) times daily., Disp: , Rfl:    dexamethasone (DECADRON) 4 MG tablet, Take 2 tablets (8 mg total) by mouth daily. Start the day after carboplatin chemotherapy for 3 days., Disp: 30 tablet, Rfl: 1   EPINEPHrine 0.3 mg/0.3 mL IJ SOAJ injection, Inject 0.3 mg into the muscle as needed for anaphylaxis., Disp: , Rfl:    lidocaine-prilocaine (EMLA) cream, Apply to affected area once, Disp: 30 g, Rfl: 3   loratadine (CLARITIN) 10 MG tablet, Take 10 mg by mouth daily., Disp: , Rfl:    LORazepam (ATIVAN) 0.5 MG tablet, Take 1 tablet (0.5 mg  total) by mouth every 6 (six) hours as needed (Nausea or vomiting)., Disp: 30 tablet, Rfl: 0   Omega-3 1000 MG CAPS, Take 1,000 mg by mouth daily., Disp: , Rfl:    rosuvastatin (CRESTOR) 5 MG tablet, Take 1 tablet (5 mg total) by mouth daily., Disp: 90 tablet, Rfl: 1   traMADol (ULTRAM) 50 MG tablet, Take 1 tablet (50 mg total) by mouth every 8 (eight) hours as needed., Disp: 90 tablet, Rfl: 0   vitamin B-12 (CYANOCOBALAMIN) 500 MCG tablet, Take 500 mcg by mouth daily., Disp: , Rfl:    HYDROcodone-acetaminophen (NORCO/VICODIN) 5-325 MG tablet, Take 1 tablet by mouth every 6 (six) hours as needed. (Patient not taking: Reported on 10/11/2020), Disp: 15 tablet, Rfl: 0   ipratropium-albuterol (DUONEB) 0.5-2.5 (3) MG/3ML SOLN, USE 1 VIAL  IN  NEBULIZER TWICE  DAILY (Patient not taking: No sig reported), Disp: 180 mL, Rfl: 11   methocarbamol (ROBAXIN) 750 MG tablet, Take 750 mg by mouth daily. (Patient not taking: Reported on 10/11/2020), Disp: , Rfl:    ondansetron (ZOFRAN ODT) 4 MG disintegrating tablet, Take 1 tablet (4 mg total) by mouth every 6 (six) hours as needed for nausea or vomiting. (Patient not taking: Reported on 10/11/2020),  Disp: 20 tablet, Rfl: 0   ondansetron (ZOFRAN) 8 MG tablet, Take 1 tablet (8 mg total) by mouth 2 (two) times daily as needed for refractory nausea / vomiting. Start on day 3 after carboplatin chemo. (Patient not taking: No sig reported), Disp: 30 tablet, Rfl: 1   riboflavin (VITAMIN B-2) 100 MG TABS tablet, Take 200 mg by mouth in the morning and at bedtime. (Patient not taking: No sig reported), Disp: , Rfl:   Physical exam:  Vitals:   10/11/20 0904  BP: 112/68  Pulse: 98  Resp: 16  Temp: 97.6 F (36.4 C)  TempSrc: Tympanic  Weight: 103 lb 8 oz (46.9 kg)  Height: 5' 2"  (1.575 m)   Physical Exam Constitutional:      General: She is not in acute distress.    Comments: Patient is elderly and frail.  She is sitting in a wheelchair  Cardiovascular:     Rate and Rhythm: Normal rate and regular rhythm.     Heart sounds: Normal heart sounds.  Pulmonary:     Effort: Pulmonary effort is normal.     Breath sounds: Normal breath sounds.  Abdominal:     General: Bowel sounds are normal.     Palpations: Abdomen is soft.  Musculoskeletal:     Cervical back: Normal range of motion.     Right lower leg: No edema.     Left lower leg: No edema.  Skin:    General: Skin is warm and dry.  Neurological:     Mental Status: She is alert and oriented to person, place, and time.     CMP Latest Ref Rng & Units 10/11/2020  Glucose 70 - 99 mg/dL 121(H)  BUN 8 - 23 mg/dL 9  Creatinine 0.44 - 1.00 mg/dL 0.59  Sodium 135 - 145 mmol/L 134(L)  Potassium 3.5 - 5.1 mmol/L 3.1(L)  Chloride 98 - 111 mmol/L 99  CO2 22 - 32 mmol/L 24  Calcium 8.9 - 10.3 mg/dL 8.5(L)  Total Protein 6.5 - 8.1 g/dL 6.2(L)  Total Bilirubin 0.3 - 1.2 mg/dL 0.5  Alkaline Phos 38 - 126 U/L 125  AST 15 - 41 U/L 22  ALT 0 - 44 U/L 13   CBC Latest Ref Rng & Units  10/11/2020  WBC 4.0 - 10.5 K/uL 9.0  Hemoglobin 12.0 - 15.0 g/dL 10.5(L)  Hematocrit 36.0 - 46.0 % 32.4(L)  Platelets 150 - 400 K/uL 473(H)    No images are  attached to the encounter.  CT Head Wo Contrast  Result Date: 10/01/2020 CLINICAL DATA:  Fall. EXAM: CT HEAD WITHOUT CONTRAST TECHNIQUE: Contiguous axial images were obtained from the base of the skull through the vertex without intravenous contrast. COMPARISON:  MRI brain 09/01/2018 FINDINGS: Brain: No evidence of acute infarction, hemorrhage, hydrocephalus, extra-axial collection or mass lesion/mass effect. Prominence of the sulci and ventricles compatible with brain atrophy. Again seen are remote small infarcts involving the left superior frontal gyrus and posterior aspect of the right gyrus rectus. There is mild diffuse low-attenuation within the subcortical and periventricular white matter compatible with chronic microvascular disease. Signs of previous right frontal craniotomy with underlying dural thickening and calcifications. Vascular: Right MCA aneurysm clip. No hyperdense vessel or unexpected calcification. Skull: Previous right frontal craniotomy. Negative for acute fracture or focal lesion. Sinuses/Orbits: Paranasal sinuses appear clear. Mastoid air cells are also clear. Other: None IMPRESSION: 1. No acute intracranial abnormalities. 2. Chronic small vessel ischemic disease and brain atrophy. 3. 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 4. Signs of previous right frontal craniotomy with underlying dural thickening and calcifications. Electronically Signed   By: Kerby Moors M.D.   On: 10/01/2020 06:16   CT Hip Right Wo Contrast  Result Date: 10/01/2020 CLINICAL DATA:  80 year old female status post fall with right hip pain. Hip arthroplasties. EXAM: CT OF THE RIGHT HIP WITHOUT CONTRAST TECHNIQUE: Multidetector CT imaging of the right hip was performed according to the standard protocol. Multiplanar CT image reconstructions were also generated. COMPARISON:  Right hip radiographs 0114 hours today. CT Abdomen and Pelvis  11/25/2017. FINDINGS: Advanced chronic iliac artery calcified atherosclerosis. Proximal femoral artery calcified plaque. Vascular patency is not evaluated in the absence of IV contrast. No lymphadenopathy. Negative visible lower abdominal and pelvic viscera aside from distended urinary bladder and retained stool in the rectum. No pelvic free fluid. Osteopenia. Chronic bipolar right hip arthroplasty. Subtle nondisplaced fracture of the right inferior pubic ramus suspected on series 2, image 70 and series 4, image 45, appears new since 2019. Left acetabulum and other visible right hemipelvis appear stable and intact. Right hip arthroplasty remains normally located. No evidence of loosening. Streak artifact in the proximal femur. No acute fracture of the visible proximal femur identified. No definite right hip region soft tissue injury. IMPRESSION: 1. Osteopenia. Subtle nondisplaced fracture of the right inferior pubic ramus is suspected. 2. No other acute fracture identified about the right hip or hemipelvis. Stable bipolar right hip arthroplasty with no adverse features. 3. Advanced calcified atherosclerosis.  Distended urinary bladder. Electronically Signed   By: Genevie Ann M.D.   On: 10/01/2020 06:17   PERIPHERAL VASCULAR CATHETERIZATION  Result Date: 09/16/2020 See surgical note for result.  DG Hip Unilat  With Pelvis 2-3 Views Right  Result Date: 10/01/2020 CLINICAL DATA:  Fall, right hip pain EXAM: DG HIP (WITH OR WITHOUT PELVIS) 2-3V RIGHT COMPARISON:  None. FINDINGS: Normal alignment. No acute fracture or dislocation. Bilateral total hip arthroplasty has been performed. Vascular calcifications are noted within the a pelvis and medial thighs bilaterally. IMPRESSION: No acute fracture or dislocation. Electronically Signed   By: Fidela Salisbury MD   On: 10/01/2020 01:36   DG Femur Min 2 Views Right  Result Date: 10/01/2020 CLINICAL DATA:  Fall, right leg pain EXAM: RIGHT FEMUR 2 VIEWS COMPARISON:  None.  FINDINGS: Right total hip arthroplasty has been performed. Normal alignment. No fracture or dislocation. Vascular calcifications are noted within the right thigh. Limited evaluation of the right knee demonstrates at least mild degenerative change. IMPRESSION: No acute fracture or dislocation. Electronically Signed   By: Fidela Salisbury MD   On: 10/01/2020 01:37     Assessment and plan- Patient is a 80 y.o. 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  She is here for on treatment assessment prior to cycle 2 of CarboTaxol and Keytruda  Patient tolerated cycle 1 of CarboTaxol Keytruda poorly.  She had significant fatigue to the point that she could not get out of her bed.  She then had a fall 3 to 4 days after chemotherapy and was negative as well.  I will therefore hold off on doing CarboTaxol chemotherapy at this time and continue with single agent Keytruda alone given that her PD-L1 expression was more than 95%.  Plan is to continue Keytruda IV every 3 weeks until progression or toxicity.  Will consider chemotherapy down the line if she has progression with first-line treatment and remains a chemo candidate.  Hyponatremia/hypokalemia: We will give her 1 L of IV fluids with 20 mEq of IV potassium today.  She will be seen by covering NP in 10 days with BMP for possible fluids and I will see her back in 3 weeks with labs for cycle 3 of Keytruda   Visit Diagnosis 1. Encounter for antineoplastic immunotherapy   2. Hypokalemia   3. 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, right ([place])      Dr. Randa Evens, MD, MPH [facility] at [facility] [phone] 10/11/2020 12:48 PM

## 2020-10-11 NOTE — Progress Notes (Signed)
On 7/30 she fell at home-results of xray-Subtle nondisplaced fracture of the right inferior pubic ramus is suspected. Bowels working 3-4 days a week. She is eating great despite the wt. Loss. Her son has her on supplements and they will add more since pt has lost weight

## 2020-10-11 NOTE — Progress Notes (Signed)
No carbo/taxol today.  Just dex and [place]

## 2020-10-11 NOTE — Progress Notes (Signed)
Nutrition Follow-up:   Patient with stage IV lung cancer s/p SBRT now with mets to liver.  Patient not to receive carbo/taxol today, just dexamethasone and [place].  Met with patient during infusion.  Patient reports that appetite is about 50/50.  Some days eats well others not so good.  Says she usually eats muffin with coffee for breakfast.  Has sandwich for lunch and fruit.  Dinner maybe her Meals on [employer].  Patient searching for words during visit/memory.  Says that she has not tried oral nutrition supplement samples.  Thinks she has tried boost.  Says that she does not drink them.      Medications: reviewed  Labs: reviewed  Anthropometrics:   Weight 103 lb 8 oz today  105 lb 13.1 oz on 7/30  107 lb on 7/19 UBW of 114 lb 9.6 oz on 6/17   NUTRITION DIAGNOSIS: Inadequate oral intake continues   INTERVENTION:  Recommend 350 calorie or higher shake 1-2 times per day between meals Continue high calorie, high protein solid foods    MONITORING, EVALUATION, GOAL: weight trends, intake   NEXT VISIT: Tuesday, August 30th during infusion  Chantilly Linskey B. Zenia Resides, [place], [place] Registered Dietitian [phone] (mobile)

## 2020-10-11 NOTE — Patient Instructions (Signed)
[facility]  Discharge Instructions: Thank you for choosing [facility] to provide your oncology and hematology care.  If you have a lab appointment with the [facility], please go directly to the [facility] and check in at the registration area.  Wear comfortable clothing and clothing appropriate for easy access to any Portacath or PICC line.   We strive to give you quality time with your provider. You may need to reschedule your appointment if you arrive late (15 or more minutes).  Arriving late affects you and other patients whose appointments are after yours.  Also, if you miss three or more appointments without notifying the office, you may be dismissed from the clinic at the provider's discretion.      For prescription refill requests, have your pharmacy contact our office and allow 72 hours for refills to be completed.    Today you received the following chemotherapy and/or immunotherapy agents Keytruda       To help prevent nausea and vomiting after your treatment, we encourage you to take your nausea medication as directed.  BELOW ARE SYMPTOMS THAT SHOULD BE REPORTED IMMEDIATELY: *FEVER GREATER THAN 100.4 F (38 C) OR HIGHER *CHILLS OR SWEATING *NAUSEA AND VOMITING THAT IS NOT CONTROLLED WITH YOUR NAUSEA MEDICATION *UNUSUAL SHORTNESS OF BREATH *UNUSUAL BRUISING OR BLEEDING *URINARY PROBLEMS (pain or burning when urinating, or frequent urination) *BOWEL PROBLEMS (unusual diarrhea, constipation, pain near the anus) TENDERNESS IN MOUTH AND THROAT WITH OR WITHOUT PRESENCE OF ULCERS (sore throat, sores in mouth, or a toothache) UNUSUAL RASH, SWELLING OR PAIN  UNUSUAL VAGINAL DISCHARGE OR ITCHING   Items with * indicate a potential emergency and should be followed up as soon as possible or go to the Emergency Department if any problems should occur.  Please show the CHEMOTHERAPY ALERT CARD or IMMUNOTHERAPY ALERT CARD at check-in  to the Emergency Department and triage nurse.  Should you have questions after your visit or need to cancel or reschedule your appointment, please contact [facility]  [phone] and follow the prompts.  Office hours are 8:00 a.m. to 4:30 p.m. Monday - Friday. Please note that voicemails left after 4:00 p.m. may not be returned until the following business day.  We are closed weekends and major holidays. You have access to a nurse at all times for urgent questions. Please call the main number to the clinic [phone] and follow the prompts.  For any non-urgent questions, you may also contact your provider using MyChart. We now offer e-Visits for anyone 12 and older to request care online for non-urgent symptoms. For details visit [email or web address].   Also download the MyChart app! Go to the app store, search "MyChart", open the app, select [facility], and log in with your MyChart username and password.  Due to Covid, a mask is required upon entering the hospital/clinic. If you do not have a mask, one will be given to you upon arrival. For doctor visits, patients may have 1 support person aged 40 or older with them. For treatment visits, patients cannot have anyone with them due to current Covid guidelines and our immunocompromised population.     Hypokalemia Hypokalemia means that the amount of potassium in the blood is lower than normal. Potassium is a chemical (electrolyte) that helps regulate the amount of fluid in the body. It also stimulates muscle tightening (contraction) and helps nerves work properly. Normally, most of the body's potassium is inside cells, and only  a very small amount is in the blood. Because the amount in the blood is so small, minorchanges to potassium levels in the blood can be life-threatening. What are the causes? This condition may be caused by: Antibiotic medicine. Diarrhea or vomiting. Taking too much of a medicine that  helps you have a bowel movement (laxative) can cause diarrhea and lead to hypokalemia. Chronic kidney disease (CKD). Medicines that help the body get rid of excess fluid (diuretics). Eating disorders, such as bulimia. Low magnesium levels in the body. Sweating a lot. What are the signs or symptoms? Symptoms of this condition include: Weakness. Constipation. Fatigue. Muscle cramps. Mental confusion. Skipped heartbeats or irregular heartbeat (palpitations). Tingling or numbness. How is this diagnosed? This condition is diagnosed with a blood test. How is this treated? This condition may be treated by: Taking potassium supplements by mouth. Adjusting the medicines that you take. Eating more foods that contain a lot of potassium. If your potassium level is very low, you may need to get potassium through anIV and be monitored in the hospital. Follow these instructions at home:  Take over-the-counter and prescription medicines only as told by your health care provider. This includes vitamins and supplements. Eat a healthy diet. A healthy diet includes fresh fruits and vegetables, whole grains, healthy fats, and lean proteins. If instructed, eat more foods that contain a lot of potassium. This includes: Nuts, such as peanuts and pistachios. Seeds, such as sunflower seeds and pumpkin seeds. Peas, lentils, and lima beans. Whole grain and bran cereals and breads. Fresh fruits and vegetables, such as apricots, avocado, bananas, cantaloupe, kiwi, oranges, tomatoes, asparagus, and potatoes. Orange juice. Tomato juice. Red meats. Yogurt. Keep all follow-up visits as told by your health care provider. This is important. Contact a health care provider if you: Have weakness that gets worse. Feel your heart pounding or racing. Vomit. Have diarrhea. Have diabetes (diabetes mellitus) and you have trouble keeping your blood sugar (glucose) in your target range. Get help right away if  you: Have chest pain. Have shortness of breath. Have vomiting or diarrhea that lasts for more than 2 days. Faint. Summary Hypokalemia means that the amount of potassium in the blood is lower than normal. This condition is diagnosed with a blood test. Hypokalemia may be treated by taking potassium supplements, adjusting the medicines that you take, or eating more foods that are high in potassium. If your potassium level is very low, you may need to get potassium through an IV and be monitored in the hospital. This information is not intended to replace advice given to you by your health care provider. Make sure you discuss any questions you have with your healthcare provider. Document Revised: 10/02/2017 Document Reviewed: 10/02/2017 Elsevier Patient Education  [place].

## 2020-10-13 ENCOUNTER — Telehealth: Payer: Medicare Other | Admitting: Hospice and Palliative Medicine

## 2020-10-13 ENCOUNTER — Inpatient Hospital Stay: Payer: Medicare Other

## 2020-10-17 DIAGNOSIS — J449 Chronic obstructive pulmonary disease, unspecified: Secondary | ICD-10-CM | POA: Diagnosis not present

## 2020-10-18 ENCOUNTER — Encounter: Payer: Self-pay | Admitting: Internal Medicine

## 2020-10-18 ENCOUNTER — Telehealth (INDEPENDENT_AMBULATORY_CARE_PROVIDER_SITE_OTHER): Payer: Medicare Other | Admitting: Internal Medicine

## 2020-10-18 ENCOUNTER — Telehealth: Payer: Medicare Other | Admitting: Internal Medicine

## 2020-10-18 VITALS — Resp 16 | Ht 62.0 in | Wt 103.8 lb

## 2020-10-18 DIAGNOSIS — C3491 Malignant neoplasm of unspecified part of right bronchus or lung: Secondary | ICD-10-CM | POA: Diagnosis not present

## 2020-10-18 DIAGNOSIS — I700 Atherosclerosis of aorta: Secondary | ICD-10-CM | POA: Diagnosis not present

## 2020-10-18 DIAGNOSIS — F339 Major depressive disorder, recurrent, unspecified: Secondary | ICD-10-CM | POA: Diagnosis not present

## 2020-10-18 MED ORDER — ESCITALOPRAM OXALATE 10 MG PO TABS: ORAL_TABLET | ORAL | 1 refills | Status: DC

## 2020-10-18 NOTE — Progress Notes (Signed)
[facility] [place], [place] [address]  Internal MEDICINE  Office Visit Note  Patient Name: Latoya Lynch  [id number]  [id number]  Date of Service: 10/18/2020     Chief Complaint  Patient presents with   Follow-up    ER fup.  Seen on 09/30/20, pt had a fall.  Had a hairline pelvic fracture.  Nothing that required surgery.  Pt is now using a walker.   Telephone Screen    [phone] video visit Son Vicente Males)   Telephone Assessment     HPI Pt is here for recent hospital follow up, went to ED after sustaining a fall. 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  Patient has had falls and word searching or word finding difficulty According to her son she had stopped taking her medications including Wellbutrin which caused her to have some of the symptoms.  Slight cough and congestion  Pt is able to communicate well ?? Dementia or uncontrolled depression. She has restarted her wellbutrin  Current Medication: Outpatient Encounter Medications as of 10/18/2020  Medication Sig Note   buPROPion (WELLBUTRIN SR) 150 MG 12 hr tablet Take 150 mg by mouth 2 (two) times daily.    EPINEPHrine 0.3 mg/0.3 mL IJ SOAJ injection Inject 0.3 mg into the muscle as needed for anaphylaxis. 08/19/2020: prn   escitalopram (LEXAPRO) 10 MG tablet Take one tab a day between 5-7 pm    traMADol (ULTRAM) 50 MG tablet Take 1 tablet (50 mg total) by mouth every 8 (eight) hours as needed.    vitamin B-12 (CYANOCOBALAMIN) 500 MCG tablet Take 500 mcg by mouth daily.    loratadine (CLARITIN) 10 MG tablet Take 10 mg by mouth daily.    LORazepam (ATIVAN) 0.5 MG tablet Take 1 tablet (0.5 mg total) by mouth every 6 (six) hours as needed (Nausea or vomiting).    Omega-3 1000 MG CAPS Take 1,000 mg by mouth daily.    [DISCONTINUED] dexamethasone (DECADRON) 4 MG tablet Take 2 tablets  (8 mg total) by mouth daily. Start the day after carboplatin chemotherapy for 3 days. (Patient not taking: Reported on 10/18/2020)    [DISCONTINUED] HYDROcodone-acetaminophen (NORCO/VICODIN) 5-325 MG tablet Take 1 tablet by mouth every 6 (six) hours as needed. (Patient not taking: Reported on 10/11/2020)    [DISCONTINUED] ipratropium-albuterol (DUONEB) 0.5-2.5 (3) MG/3ML SOLN USE 1 VIAL  IN  NEBULIZER TWICE  DAILY (Patient not taking: No sig reported)    [DISCONTINUED] lidocaine-prilocaine (EMLA) cream Apply to affected area once    [DISCONTINUED] methocarbamol (ROBAXIN) 750 MG tablet Take 750 mg by mouth daily. (Patient not taking: No sig reported)    [DISCONTINUED] ondansetron (ZOFRAN ODT) 4 MG disintegrating tablet Take 1 tablet (4 mg total) by mouth every 6 (six) hours as needed for nausea or vomiting. (Patient not taking: No sig reported)    [DISCONTINUED] ondansetron (ZOFRAN) 8 MG tablet Take 1 tablet (8 mg total) by mouth 2 (two) times daily as needed for refractory nausea / vomiting. Start on day 3 after carboplatin chemo. (Patient not taking: No sig reported)    [DISCONTINUED] riboflavin (VITAMIN B-2) 100 MG TABS tablet Take 200 mg by mouth in the morning and at bedtime. (Patient not taking: No sig reported)    [DISCONTINUED] rosuvastatin (CRESTOR) 5 MG tablet Take 1 tablet (5 mg total) by mouth daily. (Patient not taking: Reported on 10/18/2020)  No facility-administered encounter medications on file as of 10/18/2020.    Surgical History: Past Surgical History:  Procedure Laterality Date   CATARACT EXTRACTION W/ INTRAOCULAR LENS  IMPLANT, BILATERAL     CEREBRAL ANEURYSM REPAIR  01/2003   ELECTROMAGNETIC NAVIGATION BROCHOSCOPY Right 07/30/2018   Procedure: ELECTROMAGNETIC NAVIGATION BRONCHOSCOPY RIGHT;  Surgeon: Tyler Pita, MD;  Location: [facility];  Service: Cardiopulmonary;  Laterality: Right;   ENDOBRONCHIAL ULTRASOUND Right 07/30/2018   Procedure: ENDOBRONCHIAL ULTRASOUND RIGHT;   Surgeon: Tyler Pita, MD;  Location: [facility] ORS;  Service: Cardiopulmonary;  Laterality: Right;   PORTA CATH INSERTION N/A 09/16/2020   Procedure: PORTA CATH INSERTION;  Surgeon: Algernon Huxley, MD;  Location: [facility];  Service: Cardiovascular;  Laterality: N/A;   retinal detatchment Right 05/2017   TONSILLECTOMY     TOTAL HIP ARTHROPLASTY Right 01/2015   TOTAL HIP ARTHROPLASTY Left 05/2019   in [place] History: Past Medical History:  Diagnosis Date   Arthritis    COPD (chronic obstructive pulmonary disease) ([place])    Depression    History of methicillin resistant staphylococcus aureus (MRSA)    Hyperlipidemia    Lung cancer ([place])    Lung mass    right lung   Osteoarthritis     Family History: Family History  Problem Relation Age of Onset   Heart attack Mother    Cancer Maternal Grandmother 80       spine ca   Heart attack Maternal Grandfather     Social History   Socioeconomic History   Marital status: Widowed    Spouse name: Not on file   Number of children: 3   Years of education: Not on file   Highest education level: Not on file  Occupational History   Not on file  Tobacco Use   Smoking status: Every Day    Packs/day: 0.25    Years: 60.00    Pack years: 15.00    Types: Cigarettes   Smokeless tobacco: Never   Tobacco comments:    about 3-4 cigarettes a day-10/18/20 and working on decreasing and working towards quitting  Vaping Use   Vaping Use: Never used  Substance and Sexual Activity   Alcohol use: Not Currently   Drug use: Never   Sexual activity: Not Currently  Other Topics Concern   Not on file  Social History Narrative   Lives by herself with a Risk analyst    Social Determinants of Health   Financial Resource Strain: Not on file  Food Insecurity: Not on file  Transportation Needs: Not on file  Physical Activity: Not on file  Stress: Not on file  Social Connections: Not on file  Intimate  Partner Violence: Not on file      Review of Systems  Constitutional:  Negative for fatigue and fever.  HENT:  Negative for congestion, mouth sores and postnasal drip.   Respiratory:  Negative for cough.   Cardiovascular:  Negative for chest pain.  Genitourinary:  Negative for flank pain.  Neurological:        Word finding difficulty   Psychiatric/Behavioral: Negative.     Vital Signs: Resp 16   Ht 5\' 2"  (1.575 m)   Wt 103 lb 12.8 oz (47.1 kg)   BMI 18.99 kg/m    Physical Exam Looks comfortable   Assessment/Plan: 1. 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 Aortic atherosclerosis (HCC) No treatment indicated since pt is palliative care   3. Depression, recurrent ([place]) Will add Lexapro 10 mg po qd, continue wellbutrin 150 mg po qd. If no improvement in her symptoms, will add aricept.    General Counseling: suzanna zahn understanding of the findings of todays visit and agrees with plan of treatment. I have discussed any further diagnostic evaluation that may be needed or ordered today. We also reviewed her medications today. she has been encouraged to call the office with any questions or concerns that should arise related to todays visit.    Counseling:  [place] Controlled Substance Database was reviewed by me.  Meds ordered this encounter  Medications   escitalopram (LEXAPRO) 10 MG tablet    Sig: Take one tab a day between 5-7 pm    Dispense:  30 tablet    Refill:  1      I have reviewed all medical records from hospital follow up including radiology reports and consults from other physicians. Appropriate follow up diagnostics will be scheduled as needed. Patient/ Family understands the plan of treatment. Time spent30 minutes.   Dr Lavera Guise, MD Internal Medicine

## 2020-10-20 ENCOUNTER — Telehealth: Payer: Self-pay

## 2020-10-20 MED ORDER — ESCITALOPRAM OXALATE 10 MG PO TABS: ORAL_TABLET | ORAL | 1 refills | Status: DC

## 2020-10-20 MED ORDER — BUPROPION HCL ER (SR) 150 MG PO TB12: 150.0000 mg | ORAL_TABLET | Freq: Every day | ORAL | 1 refills | Status: AC

## 2020-10-20 NOTE — Telephone Encounter (Signed)
Per DFK I called and informed pt to take bupropion SR 150 mg once a day in the morning and to take the Lexapro 10 mg daily in the evening.  Pt understood directions

## 2020-10-20 NOTE — Telephone Encounter (Signed)
-----   Message from Lavera Guise, MD sent at 10/18/2020 10:50 PM EDT ----- Please speak with her son to make sure she is taking one tab of Wellbutrin once a day as in her chart it is 2 x day

## 2020-10-21 ENCOUNTER — Inpatient Hospital Stay (HOSPITAL_BASED_OUTPATIENT_CLINIC_OR_DEPARTMENT_OTHER): Payer: Medicare Other | Admitting: Oncology

## 2020-10-21 ENCOUNTER — Inpatient Hospital Stay: Payer: Medicare Other

## 2020-10-21 ENCOUNTER — Encounter: Payer: Self-pay | Admitting: Oncology

## 2020-10-21 VITALS — BP 105/48 | HR 99 | Temp 98.3°F | Wt 102.2 lb

## 2020-10-21 DIAGNOSIS — C3491 Malignant neoplasm of unspecified part of right bronchus or lung: Secondary | ICD-10-CM | POA: Diagnosis not present

## 2020-10-21 DIAGNOSIS — C3431 Malignant neoplasm of lower lobe, right bronchus or lung: Secondary | ICD-10-CM | POA: Diagnosis not present

## 2020-10-21 DIAGNOSIS — C787 Secondary malignant neoplasm of liver and intrahepatic bile duct: Secondary | ICD-10-CM | POA: Diagnosis not present

## 2020-10-21 DIAGNOSIS — M8580 Other specified disorders of bone density and structure, unspecified site: Secondary | ICD-10-CM | POA: Diagnosis not present

## 2020-10-21 DIAGNOSIS — E785 Hyperlipidemia, unspecified: Secondary | ICD-10-CM | POA: Diagnosis not present

## 2020-10-21 DIAGNOSIS — Z95828 Presence of other vascular implants and grafts: Secondary | ICD-10-CM

## 2020-10-21 DIAGNOSIS — E876 Hypokalemia: Secondary | ICD-10-CM | POA: Diagnosis not present

## 2020-10-21 DIAGNOSIS — Z79899 Other long term (current) drug therapy: Secondary | ICD-10-CM | POA: Diagnosis not present

## 2020-10-21 DIAGNOSIS — F17210 Nicotine dependence, cigarettes, uncomplicated: Secondary | ICD-10-CM | POA: Diagnosis not present

## 2020-10-21 DIAGNOSIS — Z7952 Long term (current) use of systemic steroids: Secondary | ICD-10-CM | POA: Diagnosis not present

## 2020-10-21 DIAGNOSIS — Z5112 Encounter for antineoplastic immunotherapy: Secondary | ICD-10-CM | POA: Diagnosis not present

## 2020-10-21 DIAGNOSIS — J449 Chronic obstructive pulmonary disease, unspecified: Secondary | ICD-10-CM | POA: Diagnosis not present

## 2020-10-21 DIAGNOSIS — E871 Hypo-osmolality and hyponatremia: Secondary | ICD-10-CM | POA: Diagnosis not present

## 2020-10-21 LAB — CBC WITH DIFFERENTIAL/PLATELET
Abs Immature Granulocytes: 0.03 10*3/uL (ref 0.00–0.07)
Basophils Absolute: 0.1 10*3/uL (ref 0.0–0.1)
Basophils Relative: 1 %
Eosinophils Absolute: 0.1 10*3/uL (ref 0.0–0.5)
Eosinophils Relative: 1 %
HCT: 35.8 % — ABNORMAL LOW (ref 36.0–46.0)
Hemoglobin: 11.7 g/dL — ABNORMAL LOW (ref 12.0–15.0)
Immature Granulocytes: 0 %
Lymphocytes Relative: 15 %
Lymphs Abs: 1.1 10*3/uL (ref 0.7–4.0)
MCH: 28.8 pg (ref 26.0–34.0)
MCHC: 32.7 g/dL (ref 30.0–36.0)
MCV: 88.2 fL (ref 80.0–100.0)
Monocytes Absolute: 0.7 10*3/uL (ref 0.1–1.0)
Monocytes Relative: 10 %
Neutro Abs: 5.4 10*3/uL (ref 1.7–7.7)
Neutrophils Relative %: 73 %
Platelets: 369 10*3/uL (ref 150–400)
RBC: 4.06 MIL/uL (ref 3.87–5.11)
RDW: 21.3 % — ABNORMAL HIGH (ref 11.5–15.5)
WBC: 7.4 10*3/uL (ref 4.0–10.5)
nRBC: 0 % (ref 0.0–0.2)

## 2020-10-21 LAB — COMPREHENSIVE METABOLIC PANEL
ALT: 12 U/L (ref 0–44)
AST: 18 U/L (ref 15–41)
Albumin: 3.3 g/dL — ABNORMAL LOW (ref 3.5–5.0)
Alkaline Phosphatase: 121 U/L (ref 38–126)
Anion gap: 8 (ref 5–15)
BUN: 12 mg/dL (ref 8–23)
CO2: 25 mmol/L (ref 22–32)
Calcium: 8.8 mg/dL — ABNORMAL LOW (ref 8.9–10.3)
Chloride: 101 mmol/L (ref 98–111)
Creatinine, Ser: 0.66 mg/dL (ref 0.44–1.00)
GFR, Estimated: >60 mL/min (ref >60)
Glucose, Bld: 109 mg/dL — ABNORMAL HIGH (ref 70–99)
Potassium: 3.5 mmol/L (ref 3.5–5.1)
Sodium: 134 mmol/L — ABNORMAL LOW (ref 135–145)
Total Bilirubin: 0.8 mg/dL (ref 0.3–1.2)
Total Protein: 6.5 g/dL (ref 6.5–8.1)

## 2020-10-21 MED ORDER — HEPARIN SOD (PORK) LOCK FLUSH 100 UNIT/ML IV SOLN
500.0000 [IU] | Freq: Once | INTRAVENOUS | Status: AC
  Administered 2020-10-21: 500 [IU] via INTRAVENOUS
  Filled 2020-10-21: qty 5

## 2020-10-21 MED ORDER — SODIUM CHLORIDE 0.9% FLUSH
10.0000 mL | Freq: Once | INTRAVENOUS | Status: AC
  Administered 2020-10-21: 10 mL via INTRAVENOUS
  Filled 2020-10-21: qty 10

## 2020-10-21 NOTE — Progress Notes (Signed)
Pt states that she feels tired. Denies nausea, vomiting, or diarrhea. States that she has been eating well and drinking plenty of fluids. Denies pain.

## 2020-10-21 NOTE — Progress Notes (Signed)
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  Heme/Onc history:  Patient is a 80 year old female who was found to have a 2.1 cm right lower lobe lung mass.  N March 2020 and she underwent a bronchoscopy which did not reveal any malignancy.  Cytology was suspicious but not diagnostic for malignancy.  She then underwent SBRT for the same.  Subsequently in August 2021 she was found to have an endoluminal filling defect in the area of the right hilum.  She again underwent empiric radiation to this area which was hypermetabolic on PET CT scan measuring 2.4 cm.  She has not required any systemic chemotherapy so far    Recent surveillance CT scan in May 2022 showed concerning liver metastases and was followed by a PET scan.  PET scan showed multiple FDG avid metastases in the lateral segment of the left hepatic lobe measuring up to 3.9 cm.  No evidence of recurrent tumor was seen in the lung.  No other evidence of metastatic disease.     NGS testing showed PD-L1 of 95% but no other actionable mutations.Tumor positive for T p53 and NF P2 L2R34G   Plan is to proceed with 4 cycles of CarboTaxol Keytruda if tolerated followed by maintenance Keytruda.  Patient tolerated cycle 1 of chemotherapy poorly with worsening fatigue as well as a fall which landed up with an ER visit.  Plan is therefore to proceed with single agent Keytruda alone   Interval history-patient presents today to assess tolerance after recent immunotherapy.  She reports tolerating Keytruda  well.  Denies any new acute symptoms.  Has chronic fatigue but weakness is improving.  Takes naps frequently throughout the day.  Uses a cane for ambulation.  Has occasional shortness of breath but this is stable.  Denies any diarrhea, constipation or abdominal pain.  She is eating and drinking well.   ECOG PS- 2 Pain scale- 0  Review of systems- Review of Systems  Constitutional:  Positive for malaise/fatigue.  Respiratory:  Positive for shortness of breath.   Musculoskeletal:  Positive for joint pain.  Neurological:  Positive for weakness.      Allergies  Allergen Reactions   Other Anaphylaxis    All nuts   Peanut Oil Anaphylaxis   Prochlorperazine Anaphylaxis     Past Medical History:  Diagnosis Date   Arthritis    COPD (chronic obstructive pulmonary disease) (HCC)    Depression    History of methicillin resistant staphylococcus aureus (MRSA)    Hyperlipidemia    Lung cancer ([place])    Lung mass    right lung   Osteoarthritis      Past Surgical History:  Procedure Laterality Date   CATARACT EXTRACTION W/ INTRAOCULAR LENS  IMPLANT, BILATERAL     CEREBRAL ANEURYSM REPAIR  01/2003   ELECTROMAGNETIC NAVIGATION BROCHOSCOPY Right 07/30/2018   Procedure: ELECTROMAGNETIC NAVIGATION BRONCHOSCOPY RIGHT;  Surgeon: Tyler Pita, MD;  Location: [facility];  Service: Cardiopulmonary;  Laterality: Right;   ENDOBRONCHIAL ULTRASOUND Right 07/30/2018   Procedure: ENDOBRONCHIAL ULTRASOUND RIGHT;  Surgeon: Tyler Pita, MD;  Location: [facility];  Service: Cardiopulmonary;  Laterality: Right;   PORTA CATH INSERTION N/A 09/16/2020   Procedure: PORTA CATH INSERTION;  Surgeon: Algernon Huxley, MD;  Location: [facility];  Service: Cardiovascular;  Laterality: N/A;   retinal detatchment Right 05/2017   TONSILLECTOMY     TOTAL HIP ARTHROPLASTY Right 01/2015   TOTAL HIP ARTHROPLASTY Left 05/2019   in [place] History   Socioeconomic History    Marital status: Widowed    Spouse name: Not on file   Number of children: 3   Years of education: Not on file   Highest education level: Not on file  Occupational History   Not on file  Tobacco Use   Smoking status: Every Day    Packs/day: 0.25    Years: 60.00    Pack years: 15.00    Types: Cigarettes   Smokeless tobacco: Never   Tobacco comments:    about 3-4 cigarettes a day-10/18/20 and working on decreasing and working towards quitting  Vaping Use   Vaping Use: Never used  Substance and Sexual Activity   Alcohol use: Not Currently   Drug use: Never   Sexual activity: Not Currently  Other Topics Concern   Not on file  Social History Narrative   Lives by herself with a Risk analyst    Social Determinants of Health   Financial Resource Strain: Not on file  Food Insecurity: Not on file  Transportation Needs: Not on file  Physical Activity: Not on file  Stress: Not on file  Social Connections: Not on file  Intimate Partner Violence: Not on file    Family History  Problem Relation Age of Onset   Heart attack Mother    Cancer Maternal Grandmother 80       spine ca   Heart attack Maternal Grandfather      Current Outpatient Medications:    buPROPion (WELLBUTRIN SR) 150 MG 12 hr tablet, Take 1 tablet (150 mg total) by mouth daily. Take 1 tablet in the morning, Disp: 90 tablet, Rfl: 1   EPINEPHrine 0.3 mg/0.3 mL IJ SOAJ injection, Inject 0.3 mg into the muscle as needed for anaphylaxis., Disp: , Rfl:    escitalopram (LEXAPRO) 10 MG tablet, Take one tab a day between 5-7 pm, Disp: 90 tablet, Rfl: 1   loratadine (CLARITIN) 10 MG tablet, Take 10 mg by mouth daily., Disp: , Rfl:    LORazepam (ATIVAN) 0.5 MG tablet, Take 1 tablet (0.5 mg total) by mouth every 6 (six) hours as needed (Nausea or vomiting)., Disp: 30 tablet, Rfl: 0   Omega-3 1000 MG CAPS, Take 1,000 mg by mouth daily., Disp: , Rfl:    traMADol (ULTRAM) 50 MG tablet, Take 1 tablet (50 mg total) by mouth every 8  (eight) hours as needed., Disp: 90 tablet, Rfl: 0   vitamin B-12 (CYANOCOBALAMIN) 500 MCG tablet, Take 500 mcg by mouth daily., Disp: , Rfl:  No current facility-administered medications for this visit.  Facility-Administered Medications Ordered in Other Visits:    heparin lock flush 100 unit/mL, 500 Units, Intravenous, Once, Goodwin Kamphaus E, NP   sodium chloride flush (NS) 0.9 % injection 10 mL, 10 mL, Intravenous, Once, Jacquelin Hawking, NP  Physical exam:  Vitals:   10/21/20 0913  BP: (!) 105/48  Pulse: 99  Temp: 98.3 F (36.8 C)  TempSrc: Oral  SpO2: 96%  Weight: 102 lb 3 oz (46.4 kg)  Physical Exam Constitutional:      Appearance: Normal appearance.  HENT:     Head: Normocephalic and atraumatic.  Eyes:     Pupils: Pupils are equal, round, and reactive to light.  Cardiovascular:     Rate and Rhythm: Normal rate and regular rhythm.     Heart sounds: Normal heart sounds. No murmur heard. Pulmonary:     Effort: Pulmonary effort is normal.     Breath sounds: Normal breath sounds. No wheezing.  Abdominal:     General: Bowel sounds are normal. There is no distension.     Palpations: Abdomen is soft.     Tenderness: There is no abdominal tenderness.  Musculoskeletal:        General: Normal range of motion.     Cervical back: Normal range of motion.  Skin:    General: Skin is warm and dry.     Findings: No rash.  Neurological:     Mental Status: She is alert and oriented to person, place, and time.  Psychiatric:        Judgment: Judgment normal.     CMP Latest Ref Rng & Units 10/21/2020  Glucose 70 - 99 mg/dL 109(H)  BUN 8 - 23 mg/dL 12  Creatinine 0.44 - 1.00 mg/dL 0.66  Sodium 135 - 145 mmol/L 134(L)  Potassium 3.5 - 5.1 mmol/L 3.5  Chloride 98 - 111 mmol/L 101  CO2 22 - 32 mmol/L 25  Calcium 8.9 - 10.3 mg/dL 8.8(L)  Total Protein 6.5 - 8.1 g/dL 6.5  Total Bilirubin 0.3 - 1.2 mg/dL 0.8  Alkaline Phos 38 - 126 U/L 121  AST 15 - 41 U/L 18  ALT 0 - 44 U/L 12    CBC Latest Ref Rng & Units 10/21/2020  WBC 4.0 - 10.5 K/uL 7.4  Hemoglobin 12.0 - 15.0 g/dL 11.7(L)  Hematocrit 36.0 - 46.0 % 35.8(L)  Platelets 150 - 400 K/uL 369    No images are attached to the encounter.  CT Head Wo Contrast  Result Date: 10/01/2020 CLINICAL DATA:  Fall. EXAM: CT HEAD WITHOUT CONTRAST TECHNIQUE: Contiguous axial images were obtained from the base of the skull through the vertex without intravenous contrast. COMPARISON:  MRI brain 09/01/2018 FINDINGS: Brain: No evidence of acute infarction, hemorrhage, hydrocephalus, extra-axial collection or mass lesion/mass effect. Prominence of the sulci and ventricles compatible with brain atrophy. Again seen are remote small infarcts involving the left superior frontal gyrus and posterior aspect of the right gyrus rectus. There is mild diffuse low-attenuation within the subcortical and periventricular white matter compatible with chronic microvascular disease. Signs of previous right frontal craniotomy with underlying dural thickening and calcifications. Vascular: Right MCA aneurysm clip. No hyperdense vessel or unexpected calcification. Skull: Previous right frontal craniotomy. Negative for acute fracture or focal lesion. Sinuses/Orbits: Paranasal sinuses appear clear. Mastoid air cells are also clear. Other: None IMPRESSION: 1. No acute intracranial abnormalities. 2. Chronic small vessel ischemic disease and brain atrophy. 3. 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 4. Signs of previous right frontal craniotomy with underlying dural thickening and calcifications. Electronically Signed   By: Kerby Moors M.D.   On: 10/01/2020 06:16   CT Hip Right Wo Contrast  Result Date: 10/01/2020 CLINICAL DATA:  80 year old female status post fall with right hip pain. Hip arthroplasties. EXAM: CT OF THE RIGHT HIP WITHOUT CONTRAST TECHNIQUE: Multidetector CT imaging of the right hip  was performed according to the standard  protocol. Multiplanar CT image reconstructions were also generated. COMPARISON:  Right hip radiographs 0114 hours today. CT Abdomen and Pelvis 11/25/2017. FINDINGS: Advanced chronic iliac artery calcified atherosclerosis. Proximal femoral artery calcified plaque. Vascular patency is not evaluated in the absence of IV contrast. No lymphadenopathy. Negative visible lower abdominal and pelvic viscera aside from distended urinary bladder and retained stool in the rectum. No pelvic free fluid. Osteopenia. Chronic bipolar right hip arthroplasty. Subtle nondisplaced fracture of the right inferior pubic ramus suspected on series 2, image 70 and series 4, image 45, appears new since 2019. Left acetabulum and other visible right hemipelvis appear stable and intact. Right hip arthroplasty remains normally located. No evidence of loosening. Streak artifact in the proximal femur. No acute fracture of the visible proximal femur identified. No definite right hip region soft tissue injury. IMPRESSION: 1. Osteopenia. Subtle nondisplaced fracture of the right inferior pubic ramus is suspected. 2. No other acute fracture identified about the right hip or hemipelvis. Stable bipolar right hip arthroplasty with no adverse features. 3. Advanced calcified atherosclerosis.  Distended urinary bladder. Electronically Signed   By: Genevie Ann M.D.   On: 10/01/2020 06:17   DG Hip Unilat  With Pelvis 2-3 Views Right  Result Date: 10/01/2020 CLINICAL DATA:  Fall, right hip pain EXAM: DG HIP (WITH OR WITHOUT PELVIS) 2-3V RIGHT COMPARISON:  None. FINDINGS: Normal alignment. No acute fracture or dislocation. Bilateral total hip arthroplasty has been performed. Vascular calcifications are noted within the a pelvis and medial thighs bilaterally. IMPRESSION: No acute fracture or dislocation. Electronically Signed   By: Fidela Salisbury MD   On: 10/01/2020 01:36   DG Femur Min 2 Views Right  Result Date:  10/01/2020 CLINICAL DATA:  Fall, right leg pain EXAM: RIGHT FEMUR 2 VIEWS COMPARISON:  None. FINDINGS: Right total hip arthroplasty has been performed. Normal alignment. No fracture or dislocation. Vascular calcifications are noted within the right thigh. Limited evaluation of the right knee demonstrates at least mild degenerative change. IMPRESSION: No acute fracture or dislocation. Electronically Signed   By: Fidela Salisbury MD   On: 10/01/2020 01:37     Assessment and plan- Patient is a 80 y.o. 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    She had cycle 1 of carbo Taxol plus Keytruda on 09/20/2020 but unfortunately did not tolerate well.  She suffered a fall on 10/01/2020 and was evaluated in the ED and found to have a nondisplaced right inferior pubic ramus fracture.  She was able to ambulate and was discharged home.  She was given IV fluids for dehydration in our [facility] clinic with reported significant fatigue and weakness.  Treatment was changed to single agent Keytruda (PD-L1 expression was greater than 95% ) which she received on 10/11/20.  Labs from today are stable without evidence of infection.  Electrolytes are stable and improved from previous.  She does not need additional IV fluids or electrolytes today.  She is scheduled to return to clinic to see Dr. Janese Banks on 11/01/2020 for second cycle of Keytruda.  I spent 20 minutes dedicated to the care of this patient (face-to-face and non-face-to-face) on the date of the encounter to include what is described in the assessment and plan.  Visit Diagnosis 1. 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

## 2020-10-22 ENCOUNTER — Encounter: Payer: Self-pay | Admitting: Internal Medicine

## 2020-10-29 ENCOUNTER — Other Ambulatory Visit: Payer: Self-pay | Admitting: *Deleted

## 2020-10-29 DIAGNOSIS — C3491 Malignant neoplasm of unspecified part of right bronchus or lung: Secondary | ICD-10-CM

## 2020-11-01 ENCOUNTER — Other Ambulatory Visit: Payer: Self-pay

## 2020-11-01 ENCOUNTER — Inpatient Hospital Stay: Payer: Medicare Other

## 2020-11-01 ENCOUNTER — Inpatient Hospital Stay (HOSPITAL_BASED_OUTPATIENT_CLINIC_OR_DEPARTMENT_OTHER): Payer: Medicare Other | Admitting: Oncology

## 2020-11-01 ENCOUNTER — Encounter: Payer: Self-pay | Admitting: Oncology

## 2020-11-01 VITALS — BP 104/60 | HR 102 | Temp 98.0°F | Resp 16 | Wt 105.0 lb

## 2020-11-01 DIAGNOSIS — C3491 Malignant neoplasm of unspecified part of right bronchus or lung: Secondary | ICD-10-CM

## 2020-11-01 DIAGNOSIS — C3431 Malignant neoplasm of lower lobe, right bronchus or lung: Secondary | ICD-10-CM | POA: Diagnosis not present

## 2020-11-01 DIAGNOSIS — Z5112 Encounter for antineoplastic immunotherapy: Secondary | ICD-10-CM | POA: Diagnosis not present

## 2020-11-01 DIAGNOSIS — C787 Secondary malignant neoplasm of liver and intrahepatic bile duct: Secondary | ICD-10-CM | POA: Diagnosis not present

## 2020-11-01 DIAGNOSIS — J449 Chronic obstructive pulmonary disease, unspecified: Secondary | ICD-10-CM | POA: Diagnosis not present

## 2020-11-01 DIAGNOSIS — E876 Hypokalemia: Secondary | ICD-10-CM | POA: Diagnosis not present

## 2020-11-01 DIAGNOSIS — E785 Hyperlipidemia, unspecified: Secondary | ICD-10-CM | POA: Diagnosis not present

## 2020-11-01 DIAGNOSIS — F17210 Nicotine dependence, cigarettes, uncomplicated: Secondary | ICD-10-CM | POA: Diagnosis not present

## 2020-11-01 DIAGNOSIS — M8580 Other specified disorders of bone density and structure, unspecified site: Secondary | ICD-10-CM | POA: Diagnosis not present

## 2020-11-01 DIAGNOSIS — E871 Hypo-osmolality and hyponatremia: Secondary | ICD-10-CM | POA: Diagnosis not present

## 2020-11-01 DIAGNOSIS — Z79899 Other long term (current) drug therapy: Secondary | ICD-10-CM | POA: Diagnosis not present

## 2020-11-01 DIAGNOSIS — Z7952 Long term (current) use of systemic steroids: Secondary | ICD-10-CM | POA: Diagnosis not present

## 2020-11-01 LAB — COMPREHENSIVE METABOLIC PANEL
ALT: 12 U/L (ref 0–44)
AST: 20 U/L (ref 15–41)
Albumin: 3.4 g/dL — ABNORMAL LOW (ref 3.5–5.0)
Alkaline Phosphatase: 113 U/L (ref 38–126)
Anion gap: 10 (ref 5–15)
BUN: 10 mg/dL (ref 8–23)
CO2: 26 mmol/L (ref 22–32)
Calcium: 8.8 mg/dL — ABNORMAL LOW (ref 8.9–10.3)
Chloride: 97 mmol/L — ABNORMAL LOW (ref 98–111)
Creatinine, Ser: 0.73 mg/dL (ref 0.44–1.00)
GFR, Estimated: >60 mL/min (ref >60)
Glucose, Bld: 149 mg/dL — ABNORMAL HIGH (ref 70–99)
Potassium: 2.9 mmol/L — ABNORMAL LOW (ref 3.5–5.1)
Sodium: 133 mmol/L — ABNORMAL LOW (ref 135–145)
Total Bilirubin: 0.6 mg/dL (ref 0.3–1.2)
Total Protein: 6.6 g/dL (ref 6.5–8.1)

## 2020-11-01 LAB — CBC WITH DIFFERENTIAL/PLATELET
Abs Immature Granulocytes: 0.03 10*3/uL (ref 0.00–0.07)
Basophils Absolute: 0.1 10*3/uL (ref 0.0–0.1)
Basophils Relative: 1 %
Eosinophils Absolute: 0.1 10*3/uL (ref 0.0–0.5)
Eosinophils Relative: 2 %
HCT: 35.3 % — ABNORMAL LOW (ref 36.0–46.0)
Hemoglobin: 11.8 g/dL — ABNORMAL LOW (ref 12.0–15.0)
Immature Granulocytes: 0 %
Lymphocytes Relative: 14 %
Lymphs Abs: 1 10*3/uL (ref 0.7–4.0)
MCH: 29.4 pg (ref 26.0–34.0)
MCHC: 33.4 g/dL (ref 30.0–36.0)
MCV: 87.8 fL (ref 80.0–100.0)
Monocytes Absolute: 0.5 10*3/uL (ref 0.1–1.0)
Monocytes Relative: 7 %
Neutro Abs: 5.7 10*3/uL (ref 1.7–7.7)
Neutrophils Relative %: 76 %
Platelets: 284 10*3/uL (ref 150–400)
RBC: 4.02 MIL/uL (ref 3.87–5.11)
RDW: 19.9 % — ABNORMAL HIGH (ref 11.5–15.5)
WBC: 7.4 10*3/uL (ref 4.0–10.5)
nRBC: 0 % (ref 0.0–0.2)

## 2020-11-01 LAB — MAGNESIUM: Magnesium: 1.9 mg/dL (ref 1.7–2.4)

## 2020-11-01 MED ORDER — POTASSIUM CHLORIDE 20 MEQ PO PACK: 20.0000 meq | PACK | Freq: Every day | ORAL | 0 refills | Status: AC

## 2020-11-01 MED ORDER — SODIUM CHLORIDE 0.9% FLUSH
10.0000 mL | INTRAVENOUS | Status: DC | PRN
  Administered 2020-11-01: 10 mL via INTRAVENOUS
  Filled 2020-11-01: qty 10

## 2020-11-01 MED ORDER — HEPARIN SOD (PORK) LOCK FLUSH 100 UNIT/ML IV SOLN
500.0000 [IU] | Freq: Once | INTRAVENOUS | Status: DC
  Filled 2020-11-01: qty 5

## 2020-11-01 MED ORDER — POTASSIUM CHLORIDE IN NACL 20-0.9 MEQ/L-% IV SOLN
Freq: Once | INTRAVENOUS | Status: AC
  Filled 2020-11-01: qty 1000

## 2020-11-01 MED ORDER — SODIUM CHLORIDE 0.9 % IV SOLN
200.0000 mg | Freq: Once | INTRAVENOUS | Status: AC
  Administered 2020-11-01: 200 mg via INTRAVENOUS
  Filled 2020-11-01: qty 8

## 2020-11-01 MED ORDER — HEPARIN SOD (PORK) LOCK FLUSH 100 UNIT/ML IV SOLN
500.0000 [IU] | Freq: Once | INTRAVENOUS | Status: AC | PRN
  Administered 2020-11-01: 500 [IU]
  Filled 2020-11-01: qty 5

## 2020-11-01 MED ORDER — SODIUM CHLORIDE 0.9% FLUSH
10.0000 mL | INTRAVENOUS | Status: DC | PRN
  Administered 2020-11-01: 10 mL
  Filled 2020-11-01: qty 10

## 2020-11-01 MED ORDER — SODIUM CHLORIDE 0.9 % IV SOLN
10.0000 mg | Freq: Once | INTRAVENOUS | Status: AC
  Administered 2020-11-01: 10 mg via INTRAVENOUS
  Filled 2020-11-01: qty 10

## 2020-11-01 MED ORDER — SODIUM CHLORIDE 0.9 % IV SOLN
Freq: Once | INTRAVENOUS | Status: AC
  Filled 2020-11-01: qty 250

## 2020-11-01 MED ORDER — HEPARIN SOD (PORK) LOCK FLUSH 100 UNIT/ML IV SOLN
INTRAVENOUS | Status: AC
  Filled 2020-11-01: qty 5

## 2020-11-01 NOTE — Progress Notes (Signed)
Per MD ok to treat with HR 102

## 2020-11-01 NOTE — Patient Instructions (Signed)
[facility]  Discharge Instructions: Thank you for choosing [facility] to provide your oncology and hematology care.  If you have a lab appointment with the [facility], please go directly to the [facility] and check in at the registration area.  Wear comfortable clothing and clothing appropriate for easy access to any Portacath or PICC line.   We strive to give you quality time with your provider. You may need to reschedule your appointment if you arrive late (15 or more minutes).  Arriving late affects you and other patients whose appointments are after yours.  Also, if you miss three or more appointments without notifying the office, you may be dismissed from the clinic at the provider's discretion.      For prescription refill requests, have your pharmacy contact our office and allow 72 hours for refills to be completed.    Today you received the following chemotherapy and/or immunotherapy agents - pembrolizumab      To help prevent nausea and vomiting after your treatment, we encourage you to take your nausea medication as directed.  BELOW ARE SYMPTOMS THAT SHOULD BE REPORTED IMMEDIATELY: *FEVER GREATER THAN 100.4 F (38 C) OR HIGHER *CHILLS OR SWEATING *NAUSEA AND VOMITING THAT IS NOT CONTROLLED WITH YOUR NAUSEA MEDICATION *UNUSUAL SHORTNESS OF BREATH *UNUSUAL BRUISING OR BLEEDING *URINARY PROBLEMS (pain or burning when urinating, or frequent urination) *BOWEL PROBLEMS (unusual diarrhea, constipation, pain near the anus) TENDERNESS IN MOUTH AND THROAT WITH OR WITHOUT PRESENCE OF ULCERS (sore throat, sores in mouth, or a toothache) UNUSUAL RASH, SWELLING OR PAIN  UNUSUAL VAGINAL DISCHARGE OR ITCHING   Items with * indicate a potential emergency and should be followed up as soon as possible or go to the Emergency Department if any problems should occur.  Please show the CHEMOTHERAPY ALERT CARD or IMMUNOTHERAPY ALERT CARD at  check-in to the Emergency Department and triage nurse.  Should you have questions after your visit or need to cancel or reschedule your appointment, please contact [facility]  [phone] and follow the prompts.  Office hours are 8:00 a.m. to 4:30 p.m. Monday - Friday. Please note that voicemails left after 4:00 p.m. may not be returned until the following business day.  We are closed weekends and major holidays. You have access to a nurse at all times for urgent questions. Please call the main number to the clinic [phone] and follow the prompts.  For any non-urgent questions, you may also contact your provider using MyChart. We now offer e-Visits for anyone 52 and older to request care online for non-urgent symptoms. For details visit [email or web address].   Also download the MyChart app! Go to the app store, search "MyChart", open the app, select [facility], and log in with your MyChart username and password.  Due to Covid, a mask is required upon entering the hospital/clinic. If you do not have a mask, one will be given to you upon arrival. For doctor visits, patients may have 1 support person aged 64 or older with them. For treatment visits, patients cannot have anyone with them due to current Covid guidelines and our immunocompromised population.   Pembrolizumab injection What is this medication? PEMBROLIZUMAB (pem broe liz ue mab) is a monoclonal antibody. It is used totreat certain types of cancer. This medicine may be used for other purposes; ask your health care provider orpharmacist if you have questions. COMMON BRAND NAME(S): Keytruda What should I tell my care team before  I take this medication? They need to know if you have any of these conditions: autoimmune diseases like Crohn's disease, ulcerative colitis, or lupus have had or planning to have an allogeneic stem cell transplant (uses someone else's stem cells) history of organ  transplant history of chest radiation nervous system problems like myasthenia gravis or Guillain-Barre syndrome an unusual or allergic reaction to pembrolizumab, other medicines, foods, dyes, or preservatives pregnant or trying to get pregnant breast-feeding How should I use this medication? This medicine is for infusion into a vein. It is given by a health careprofessional in a hospital or clinic setting. A special MedGuide will be given to you before each treatment. Be sure to readthis information carefully each time. Talk to your pediatrician regarding the use of this medicine in children. While this drug may be prescribed for children as young as 6 months for selectedconditions, precautions do apply. Overdosage: If you think you have taken too much of this medicine contact apoison control center or emergency room at once. NOTE: This medicine is only for you. Do not share this medicine with others. What if I miss a dose? It is important not to miss your dose. Call your doctor or health careprofessional if you are unable to keep an appointment. What may interact with this medication? Interactions have not been studied. This list may not describe all possible interactions. Give your health care provider a list of all the medicines, herbs, non-prescription drugs, or dietary supplements you use. Also tell them if you smoke, drink alcohol, or use illegaldrugs. Some items may interact with your medicine. What should I watch for while using this medication? Your condition will be monitored carefully while you are receiving thismedicine. You may need blood work done while you are taking this medicine. Do not become pregnant while taking this medicine or for 4 months after stopping it. Women should inform their doctor if they wish to become pregnant or think they might be pregnant. There is a potential for serious side effects to an unborn child. Talk to your health care professional or pharmacist for  more information. Do not breast-feed an infant while taking this medicine orfor 4 months after the last dose. What side effects may I notice from receiving this medication? Side effects that you should report to your doctor or health care professionalas soon as possible: allergic reactions like skin rash, itching or hives, swelling of the face, lips, or tongue bloody or black, tarry breathing problems changes in vision chest pain chills confusion constipation cough diarrhea dizziness or feeling faint or lightheaded fast or irregular heartbeat fever flushing joint pain low blood counts - this medicine may decrease the number of white blood cells, red blood cells and platelets. You may be at increased risk for infections and bleeding. muscle pain muscle weakness pain, tingling, numbness in the hands or feet persistent headache redness, blistering, peeling or loosening of the skin, including inside the mouth signs and symptoms of high blood sugar such as dizziness; dry mouth; dry skin; fruity breath; nausea; stomach pain; increased hunger or thirst; increased urination signs and symptoms of kidney injury like trouble passing urine or change in the amount of urine signs and symptoms of liver injury like dark urine, light-colored stools, loss of appetite, nausea, right upper belly pain, yellowing of the eyes or skin sweating swollen lymph nodes weight loss Side effects that usually do not require medical attention (report to yourdoctor or health care professional if they continue or are bothersome): decreased appetite  hair loss tiredness This list may not describe all possible side effects. Call your doctor for medical advice about side effects. You may report side effects to FDA at1-800-FDA-1088. Where should I keep my medication? This drug is given in a hospital or clinic and will not be stored at home. NOTE: This sheet is a summary. It may not cover all possible information. If you  have questions about this medicine, talk to your doctor, pharmacist, orhealth care provider.  2022 Elsevier/Gold Standard (2019-01-21 21:44:53)

## 2020-11-01 NOTE — Progress Notes (Signed)
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  N March 2020 and she underwent a bronchoscopy which did not reveal any malignancy.  Cytology was suspicious but not diagnostic for malignancy.  She then underwent SBRT for the same.  Subsequently in August 2021 she was found to have an endoluminal filling defect in the area of the right hilum.  She again underwent empiric radiation to this area which was hypermetabolic on PET CT scan measuring 2.4 cm.  She has not required any systemic chemotherapy so far    Recent surveillance CT scan in May 2022 showed concerning liver metastases and was followed by a PET scan.  PET scan showed multiple FDG avid metastases in the lateral segment of the left hepatic lobe measuring up to 3.9 cm.  No evidence of recurrent tumor was seen in the lung.  No other evidence of metastatic disease.     NGS testing showed PD-L1 of 95% but no other actionable mutations.Tumor positive for T p53 and NF P2 [id number]   Plan was to proceed with 4 cycles of CarboTaxol Keytruda if tolerated followed by maintenance Keytruda.  Patient tolerated cycle 1 of chemotherapy poorly with worsening fatigue as well as a fall which landed up with an ER visit.  Plan is therefore to proceed with single agent Keytruda alone  Interval history-patient tolerated single agent Keytruda well without any significant side effects.  She continues to  live alone and her daughter and her family lives close by and keep an eye on her.  No recent falls.  Appetite is stable.  Denies any significant nausea or vomiting.  ECOG PS- 1 Pain scale- 0   Review of systems- Review of Systems  Constitutional:  Positive for malaise/fatigue. Negative for chills, fever and weight loss.  HENT:  Negative for congestion, ear discharge and nosebleeds.   Eyes:  Negative for blurred vision.  Respiratory:  Negative for cough, hemoptysis, sputum production, shortness of breath and wheezing.   Cardiovascular:  Negative for chest pain, palpitations, orthopnea and claudication.  Gastrointestinal:  Negative for abdominal pain, blood in stool, constipation, diarrhea, heartburn, melena, nausea and vomiting.  Genitourinary:  Negative for dysuria, flank pain, frequency, hematuria and urgency.  Musculoskeletal:  Negative for back pain, joint pain and myalgias.  Skin:  Negative for rash.  Neurological:  Negative for dizziness, tingling, focal weakness, seizures, weakness and headaches.  Endo/Heme/Allergies:  Does not bruise/bleed easily.  Psychiatric/Behavioral:  Negative for depression and suicidal ideas. The patient does not have insomnia.      Allergies  Allergen Reactions   Other Anaphylaxis    All nuts   Peanut Oil Anaphylaxis   Prochlorperazine Anaphylaxis     Past Medical History:  Diagnosis Date   Arthritis    COPD (chronic obstructive pulmonary disease) (HCC)    Depression    History of methicillin resistant staphylococcus aureus (MRSA)    Hyperlipidemia    Lung cancer ([facility])    Lung  mass    right lung   Osteoarthritis      Past Surgical History:  Procedure Laterality Date   CATARACT EXTRACTION W/ INTRAOCULAR LENS  IMPLANT, BILATERAL     CEREBRAL ANEURYSM REPAIR  01/2003   ELECTROMAGNETIC NAVIGATION BROCHOSCOPY Right 07/30/2018   Procedure: ELECTROMAGNETIC NAVIGATION BRONCHOSCOPY RIGHT;  Surgeon: Tyler Pita, MD;  Location: [facility] ORS;   Service: Cardiopulmonary;  Laterality: Right;   ENDOBRONCHIAL ULTRASOUND Right 07/30/2018   Procedure: ENDOBRONCHIAL ULTRASOUND RIGHT;  Surgeon: Tyler Pita, MD;  Location: [facility] ORS;  Service: Cardiopulmonary;  Laterality: Right;   PORTA CATH INSERTION N/A 09/16/2020   Procedure: PORTA CATH INSERTION;  Surgeon: Algernon Huxley, MD;  Location: [facility];  Service: Cardiovascular;  Laterality: N/A;   retinal detatchment Right 05/2017   TONSILLECTOMY     TOTAL HIP ARTHROPLASTY Right 01/2015   TOTAL HIP ARTHROPLASTY Left 05/2019   in [place] History   Socioeconomic History   Marital status: Widowed    Spouse name: Not on file   Number of children: 3   Years of education: Not on file   Highest education level: Not on file  Occupational History   Not on file  Tobacco Use   Smoking status: Every Day    Packs/day: 0.25    Years: 60.00    Pack years: 15.00    Types: Cigarettes   Smokeless tobacco: Never   Tobacco comments:    about 3-4 cigarettes a day-10/18/20 and working on decreasing and working towards quitting  Vaping Use   Vaping Use: Never used  Substance and Sexual Activity   Alcohol use: Not Currently   Drug use: Never   Sexual activity: Not Currently  Other Topics Concern   Not on file  Social History Narrative   Lives by herself with a Risk analyst    Social Determinants of Health   Financial Resource Strain: Not on file  Food Insecurity: Not on file  Transportation Needs: Not on file  Physical Activity: Not on file  Stress: Not on file  Social Connections: Not on file  Intimate Partner Violence: Not on file    Family History  Problem Relation Age of Onset   Heart attack Mother    Cancer Maternal Grandmother 80       spine ca   Heart attack Maternal Grandfather      Current Outpatient Medications:    buPROPion (WELLBUTRIN SR) 150 MG 12 hr tablet, Take 1 tablet (150 mg total) by mouth daily. Take 1 tablet in  the morning, Disp: 90 tablet, Rfl: 1   EPINEPHrine 0.3 mg/0.3 mL IJ SOAJ injection, Inject 0.3 mg into the muscle as needed for anaphylaxis., Disp: , Rfl:    escitalopram (LEXAPRO) 10 MG tablet, Take one tab a day between 5-7 pm, Disp: 90 tablet, Rfl: 1   loratadine (CLARITIN) 10 MG tablet, Take 10 mg by mouth daily., Disp: , Rfl:    Omega-3 1000 MG CAPS, Take 1,000 mg by mouth daily., Disp: , Rfl:    potassium chloride (KLOR-CON) 20 MEQ packet, Take 20 mEq by mouth daily., Disp: 7 packet, Rfl: 0   vitamin B-12 (CYANOCOBALAMIN) 500 MCG tablet, Take 500 mcg by mouth daily., Disp: , Rfl:    LORazepam (ATIVAN) 0.5 MG tablet, Take 1 tablet (0.5 mg total) by mouth every 6 (six) hours as needed (Nausea or vomiting). (Patient not taking: Reported on 11/01/2020), Disp: 30 tablet, Rfl:  0   traMADol (ULTRAM) 50 MG tablet, Take 1 tablet (50 mg total) by mouth every 8 (eight) hours as needed. (Patient not taking: Reported on 11/01/2020), Disp: 90 tablet, Rfl: 0 No current facility-administered medications for this visit.  Facility-Administered Medications Ordered in Other Visits:    heparin lock flush 100 UNIT/ML injection, , , ,    heparin lock flush 100 unit/mL, 500 Units, Intravenous, Once, Sindy Guadeloupe, MD   sodium chloride flush (NS) 0.9 % injection 10 mL, 10 mL, Intravenous, PRN, Sindy Guadeloupe, MD, 10 mL at 11/01/20 0917   sodium chloride flush (NS) 0.9 % injection 10 mL, 10 mL, Intracatheter, PRN, Sindy Guadeloupe, MD, 10 mL at 11/01/20 1233  Physical exam:  Vitals:   11/01/20 0935  BP: 104/60  Pulse: (!) 102  Resp: 16  Temp: 98 F (36.7 C)  TempSrc: Tympanic  SpO2: 95%  Weight: 105 lb (47.6 kg)   Physical Exam Constitutional:      General: She is not in acute distress. Cardiovascular:     Rate and Rhythm: Normal rate and regular rhythm.     Heart sounds: Normal heart sounds.  Pulmonary:     Effort: Pulmonary effort is normal.     Breath sounds: Normal breath sounds.  Abdominal:      General: Bowel sounds are normal.     Palpations: Abdomen is soft.  Skin:    General: Skin is warm and dry.  Neurological:     Mental Status: She is alert and oriented to person, place, and time.     CMP Latest Ref Rng & Units 11/01/2020  Glucose 70 - 99 mg/dL 149(H)  BUN 8 - 23 mg/dL 10  Creatinine 0.44 - 1.00 mg/dL 0.73  Sodium 135 - 145 mmol/L 133(L)  Potassium 3.5 - 5.1 mmol/L 2.9(L)  Chloride 98 - 111 mmol/L 97(L)  CO2 22 - 32 mmol/L 26  Calcium 8.9 - 10.3 mg/dL 8.8(L)  Total Protein 6.5 - 8.1 g/dL 6.6  Total Bilirubin 0.3 - 1.2 mg/dL 0.6  Alkaline Phos 38 - 126 U/L 113  AST 15 - 41 U/L 20  ALT 0 - 44 U/L 12   CBC Latest Ref Rng & Units 11/01/2020  WBC 4.0 - 10.5 K/uL 7.4  Hemoglobin 12.0 - 15.0 g/dL 11.8(L)  Hematocrit 36.0 - 46.0 % 35.3(L)  Platelets 150 - 400 K/uL 284     Assessment and plan- Patient is a 80 y.o. 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  She is here for on treatment assessment prior to cycle 3 of single agent Keytruda  Patient received Loretha Brasil for cycle 1 which she tolerated poorly and was switched to single agent Keytruda.  Counts okay to proceed with cycle 3 of Keytruda today and I will see her back in 3 weeks for cycle 4.  Plan to repeat CT chest abdomen and pelvis with contrast after 4 cycles.  Hyponatremia/hypokalemia: We will give 1 L of IV fluids today with 20 mEq of IV potassium and give her a prescription for oral potassium for 10 days as well.  Magnesium levels were normal   Visit Diagnosis 1. 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, right ([place])   2. Hypokalemia   3. Encounter for antineoplastic immunotherapy      Dr. Randa Evens, MD, MPH [facility] at [facility] [phone] 11/01/2020 4:39 PM

## 2020-11-01 NOTE — Progress Notes (Signed)
Patient here for oncology follow-up appointment, concerns of worsening hand tremors

## 2020-11-02 ENCOUNTER — Encounter: Payer: Self-pay | Admitting: Hospice and Palliative Medicine

## 2020-11-07 ENCOUNTER — Emergency Department
Admission: EM | Admit: 2020-11-07 | Discharge: 2020-11-07 | Disposition: A | Discharge status: Home / Self Care | Payer: Medicare Other | Attending: Emergency Medicine | Admitting: Emergency Medicine

## 2020-11-07 ENCOUNTER — Emergency Department: Payer: Medicare Other

## 2020-11-07 ENCOUNTER — Other Ambulatory Visit: Payer: Self-pay

## 2020-11-07 DIAGNOSIS — Y9301 Activity, walking, marching and hiking: Secondary | ICD-10-CM | POA: Diagnosis not present

## 2020-11-07 DIAGNOSIS — Y92002 Bathroom of unspecified non-institutional (private) residence single-family (private) house as the place of occurrence of the external cause: Secondary | ICD-10-CM | POA: Insufficient documentation

## 2020-11-07 DIAGNOSIS — R0902 Hypoxemia: Secondary | ICD-10-CM | POA: Diagnosis not present

## 2020-11-07 DIAGNOSIS — Z85118 Personal history of other malignant neoplasm of bronchus and lung: Secondary | ICD-10-CM | POA: Insufficient documentation

## 2020-11-07 DIAGNOSIS — E876 Hypokalemia: Secondary | ICD-10-CM | POA: Insufficient documentation

## 2020-11-07 DIAGNOSIS — R059 Cough, unspecified: Secondary | ICD-10-CM | POA: Diagnosis not present

## 2020-11-07 DIAGNOSIS — M25559 Pain in unspecified hip: Secondary | ICD-10-CM | POA: Diagnosis not present

## 2020-11-07 DIAGNOSIS — Z91010 Allergy to peanuts: Secondary | ICD-10-CM | POA: Diagnosis not present

## 2020-11-07 DIAGNOSIS — F17210 Nicotine dependence, cigarettes, uncomplicated: Secondary | ICD-10-CM | POA: Diagnosis not present

## 2020-11-07 DIAGNOSIS — Z96643 Presence of artificial hip joint, bilateral: Secondary | ICD-10-CM | POA: Diagnosis not present

## 2020-11-07 DIAGNOSIS — J449 Chronic obstructive pulmonary disease, unspecified: Secondary | ICD-10-CM | POA: Diagnosis not present

## 2020-11-07 DIAGNOSIS — J439 Emphysema, unspecified: Secondary | ICD-10-CM | POA: Diagnosis not present

## 2020-11-07 DIAGNOSIS — K449 Diaphragmatic hernia without obstruction or gangrene: Secondary | ICD-10-CM | POA: Diagnosis not present

## 2020-11-07 DIAGNOSIS — R531 Weakness: Secondary | ICD-10-CM | POA: Insufficient documentation

## 2020-11-07 DIAGNOSIS — I671 Cerebral aneurysm, nonruptured: Secondary | ICD-10-CM | POA: Diagnosis not present

## 2020-11-07 DIAGNOSIS — S0083XA Contusion of other part of head, initial encounter: Secondary | ICD-10-CM | POA: Diagnosis not present

## 2020-11-07 DIAGNOSIS — R296 Repeated falls: Secondary | ICD-10-CM

## 2020-11-07 DIAGNOSIS — Z743 Need for continuous supervision: Secondary | ICD-10-CM | POA: Diagnosis not present

## 2020-11-07 DIAGNOSIS — S199XXA Unspecified injury of neck, initial encounter: Secondary | ICD-10-CM | POA: Diagnosis not present

## 2020-11-07 DIAGNOSIS — S0990XA Unspecified injury of head, initial encounter: Secondary | ICD-10-CM | POA: Diagnosis not present

## 2020-11-07 DIAGNOSIS — W01198A Fall on same level from slipping, tripping and stumbling with subsequent striking against other object, initial encounter: Secondary | ICD-10-CM | POA: Insufficient documentation

## 2020-11-07 DIAGNOSIS — C3490 Malignant neoplasm of unspecified part of unspecified bronchus or lung: Secondary | ICD-10-CM | POA: Diagnosis not present

## 2020-11-07 DIAGNOSIS — M4319 Spondylolisthesis, multiple sites in spine: Secondary | ICD-10-CM | POA: Diagnosis not present

## 2020-11-07 DIAGNOSIS — R000 Tachycardia, unspecified: Secondary | ICD-10-CM | POA: Insufficient documentation

## 2020-11-07 DIAGNOSIS — J9811 Atelectasis: Secondary | ICD-10-CM | POA: Diagnosis not present

## 2020-11-07 LAB — URINALYSIS, COMPLETE (UACMP) WITH MICROSCOPIC
Bacteria, UA: NONE SEEN
Bilirubin Urine: NEGATIVE
Glucose, UA: NEGATIVE mg/dL
Hgb urine dipstick: NEGATIVE
Ketones, ur: NEGATIVE mg/dL
Leukocytes,Ua: NEGATIVE
Nitrite: NEGATIVE
Protein, ur: NEGATIVE mg/dL
Specific Gravity, Urine: 1.02 (ref 1.005–1.030)
pH: 8 (ref 5.0–8.0)

## 2020-11-07 LAB — COMPREHENSIVE METABOLIC PANEL
ALT: 13 U/L (ref 0–44)
AST: 14 U/L — ABNORMAL LOW (ref 15–41)
Albumin: 2.6 g/dL — ABNORMAL LOW (ref 3.5–5.0)
Alkaline Phosphatase: 91 U/L (ref 38–126)
Anion gap: 7 (ref 5–15)
BUN: 10 mg/dL (ref 8–23)
CO2: 21 mmol/L — ABNORMAL LOW (ref 22–32)
Calcium: 7.2 mg/dL — ABNORMAL LOW (ref 8.9–10.3)
Chloride: 107 mmol/L (ref 98–111)
Creatinine, Ser: 0.39 mg/dL — ABNORMAL LOW (ref 0.44–1.00)
GFR, Estimated: >60 mL/min (ref >60)
Glucose, Bld: 107 mg/dL — ABNORMAL HIGH (ref 70–99)
Potassium: 3.3 mmol/L — ABNORMAL LOW (ref 3.5–5.1)
Sodium: 135 mmol/L (ref 135–145)
Total Bilirubin: 0.6 mg/dL (ref 0.3–1.2)
Total Protein: 5.2 g/dL — ABNORMAL LOW (ref 6.5–8.1)

## 2020-11-07 LAB — CBC WITH DIFFERENTIAL/PLATELET
Abs Immature Granulocytes: 0.07 10*3/uL (ref 0.00–0.07)
Basophils Absolute: 0 10*3/uL (ref 0.0–0.1)
Basophils Relative: 0 %
Eosinophils Absolute: 0 10*3/uL (ref 0.0–0.5)
Eosinophils Relative: 0 %
HCT: 30.8 % — ABNORMAL LOW (ref 36.0–46.0)
Hemoglobin: 10.6 g/dL — ABNORMAL LOW (ref 12.0–15.0)
Immature Granulocytes: 1 %
Lymphocytes Relative: 7 %
Lymphs Abs: 0.6 10*3/uL — ABNORMAL LOW (ref 0.7–4.0)
MCH: 30.4 pg (ref 26.0–34.0)
MCHC: 34.4 g/dL (ref 30.0–36.0)
MCV: 88.3 fL (ref 80.0–100.0)
Monocytes Absolute: 0.7 10*3/uL (ref 0.1–1.0)
Monocytes Relative: 8 %
Neutro Abs: 7.7 10*3/uL (ref 1.7–7.7)
Neutrophils Relative %: 84 %
Platelets: 262 10*3/uL (ref 150–400)
RBC: 3.49 MIL/uL — ABNORMAL LOW (ref 3.87–5.11)
RDW: 19.4 % — ABNORMAL HIGH (ref 11.5–15.5)
WBC: 9.1 10*3/uL (ref 4.0–10.5)
nRBC: 0 % (ref 0.0–0.2)

## 2020-11-07 MED ORDER — ACETAMINOPHEN 500 MG PO TABS
1000.0000 mg | ORAL_TABLET | Freq: Once | ORAL | Status: AC
  Administered 2020-11-07: 1000 mg via ORAL
  Filled 2020-11-07: qty 2

## 2020-11-07 MED ORDER — LACTATED RINGERS IV BOLUS
1000.0000 mL | Freq: Once | INTRAVENOUS | Status: AC
  Administered 2020-11-07: 1000 mL via INTRAVENOUS

## 2020-11-07 MED ORDER — OXYCODONE HCL 5 MG PO TABS
5.0000 mg | ORAL_TABLET | Freq: Once | ORAL | Status: AC
  Administered 2020-11-07: 5 mg via ORAL
  Filled 2020-11-07: qty 1

## 2020-11-07 MED ORDER — POTASSIUM CHLORIDE CRYS ER 20 MEQ PO TBCR
40.0000 meq | EXTENDED_RELEASE_TABLET | Freq: Once | ORAL | Status: AC
  Administered 2020-11-07: 40 meq via ORAL
  Filled 2020-11-07: qty 2

## 2020-11-07 MED ORDER — HEPARIN SOD (PORK) LOCK FLUSH 10 UNIT/ML IV SOLN
10.0000 [IU] | Freq: Once | INTRAVENOUS | Status: AC
  Administered 2020-11-07: 10 [IU]
  Filled 2020-11-07: qty 1

## 2020-11-07 NOTE — ED Triage Notes (Addendum)
Patient with fall at home, walking to bathroom and fell. Complains of right hip and leg pain. Here for a fall 4 weeks ago with hairline fracture to right hip at that time from a fall. Alert [facility].

## 2020-11-07 NOTE — ED Provider Notes (Signed)
[facility] Emergency Department Provider Note ____________________________________________   Event Date/Time   First MD Initiated Contact with Patient 11/07/20 1627     (approximate)  I have reviewed the triage vital signs and the nursing notes.  HISTORY  Chief Complaint No chief complaint on file.   HPI Latoya Lynch is a 80 y.o. femalewho presents to the ED for evaluation of recurrent falls..  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 Seen here about 6 weeks ago for a fall with a subtle nondisplaced fracture of the right inferior pubic ramus on the CT. She lives at home independently, ambulatory with a walker, and has family nearby.  Patient presents today, accompanied by her son-in-law, for evaluation of 4 falls in the past 24 hours.  Patient reports feeling increasingly weak in a generalized fashion over the past few days, and reports 5 or 6 falls in the past week.  She reports mechanical falls each time without associated syncope.  She reports she did strike her head today on the most recent fall, and due to recurrent natures of these incidents, she was brought into the ED by her family to get evaluated.  She reports her right hip has improved over the past few weeks, and denies any significant pain or tenderness to any of her extremities.  She reports that she feels okay, just weak in a generalized fashion.  She reports a slightly increased cough over the past couple weeks, but denies any fever, abdominal pain, emesis.  Reports tolerating p.o. intake and toileting at her baseline.  Past Medical History:  Diagnosis Date   Arthritis    COPD (chronic obstructive pulmonary disease) ([place])    Depression    History of methicillin resistant staphylococcus aureus (MRSA)    Hyperlipidemia    Lung cancer ([place])    Lung mass    right lung    Osteoarthritis     Patient Active Problem List   Diagnosis Date Noted   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, right ([place]) 08/19/2020   Goals of care, counseling/discussion 08/19/2020   Osteoarthritis of left hip 07/26/2020   Bilateral cataracts 07/26/2020   Disorder of lower respiratory system 07/26/2020   History of surgery 07/26/2020   Intracranial aneurysm 07/26/2020   Moderate aortic valve regurgitation 07/26/2020   History of total hip arthroplasty 06/02/2019   Abnormal gait 03/30/2019   Hip pain 03/30/2019   Chronic obstructive lung disease ([place]) 03/27/2018   Tobacco dependence due to cigarettes 03/27/2018   Mass of lower lobe of right lung 12/19/2017   Retinal detachment 05/16/2017    Past Surgical History:  Procedure Laterality Date   CATARACT EXTRACTION W/ INTRAOCULAR LENS  IMPLANT, BILATERAL     CEREBRAL ANEURYSM REPAIR  01/2003   ELECTROMAGNETIC NAVIGATION BROCHOSCOPY Right 07/30/2018   Procedure: ELECTROMAGNETIC NAVIGATION BRONCHOSCOPY RIGHT;  Surgeon: Tyler Pita, MD;  Location: [facility] ORS;  Service: Cardiopulmonary;  Laterality: Right;   ENDOBRONCHIAL ULTRASOUND Right 07/30/2018   Procedure: ENDOBRONCHIAL ULTRASOUND RIGHT;  Surgeon: Tyler Pita, MD;  Location: [facility] ORS;  Service: Cardiopulmonary;  Laterality: Right;   PORTA CATH INSERTION N/A 09/16/2020   Procedure: PORTA CATH INSERTION;  Surgeon: Algernon Huxley, MD;  Location: [place] [facility];  Service: Cardiovascular;  Laterality: N/A;   retinal detatchment Right 05/2017   TONSILLECTOMY     TOTAL HIP ARTHROPLASTY Right 01/2015   TOTAL  HIP ARTHROPLASTY Left 05/2019   in [place]      Prior to Admission medications   Medication Sig Start Date End Date Taking? Authorizing Provider  buPROPion (WELLBUTRIN SR) 150 MG 12 hr tablet Take 1 tablet (150 mg total) by mouth daily. Take 1 tablet in the morning 10/20/20   Lavera Guise, MD  EPINEPHrine 0.3 mg/0.3 mL IJ SOAJ injection  Inject 0.3 mg into the muscle as needed for anaphylaxis.    [provider]  escitalopram (LEXAPRO) 10 MG tablet Take one tab a day between 5-7 pm 10/20/20   Lavera Guise, MD  loratadine (CLARITIN) 10 MG tablet Take 10 mg by mouth daily.    [provider]  LORazepam (ATIVAN) 0.5 MG tablet Take 1 tablet (0.5 mg total) by mouth every 6 (six) hours as needed (Nausea or vomiting). Patient not taking: Reported on 11/01/2020 09/15/20   Sindy Guadeloupe, MD  Omega-3 1000 MG CAPS Take 1,000 mg by mouth daily.    [provider]  potassium chloride (KLOR-CON) 20 MEQ packet Take 20 mEq by mouth daily. 11/01/20   Sindy Guadeloupe, MD  traMADol (ULTRAM) 50 MG tablet Take 1 tablet (50 mg total) by mouth every 8 (eight) hours as needed. Patient not taking: Reported on 11/01/2020 09/20/20   Sindy Guadeloupe, MD  vitamin B-12 (CYANOCOBALAMIN) 500 MCG tablet Take 500 mcg by mouth daily.    [provider]    Allergies Other, Peanut oil, and Prochlorperazine  Family History  Problem Relation Age of Onset   Heart attack Mother    Cancer Maternal Grandmother 80       spine ca   Heart attack Maternal Grandfather     Social History Social History   Tobacco Use   Smoking status: Every Day    Packs/day: 0.25    Years: 60.00    Pack years: 15.00    Types: Cigarettes   Smokeless tobacco: Never   Tobacco comments:    about 3-4 cigarettes a day-10/18/20 and working on decreasing and working towards quitting  Vaping Use   Vaping Use: Never used  Substance Use Topics   Alcohol use: Not Currently   Drug use: Never    Review of Systems  Constitutional: No fever/chills.  Positive generalized weakness and recurrent falls Eyes: No visual changes. ENT: No sore throat. Cardiovascular: Denies chest pain. Respiratory: Denies shortness of breath. Gastrointestinal: No abdominal pain.  No nausea, no vomiting.  No diarrhea.  No constipation. Genitourinary: Negative for  dysuria. Musculoskeletal: Negative for back pain. Skin: Negative for rash. Neurological: Negative for focal weakness or numbness.  ____________________________________________   PHYSICAL EXAM:  VITAL SIGNS: Vitals:   11/07/20 1830 11/07/20 1900  BP: 132/76 116/74  Pulse: 100   Resp: 18 16  Temp:    SpO2: 92%      Constitutional: Alert and oriented. Well appearing and in no acute distress.  Pleasant and conversational in full sentences. Sitting upright in bed with her bilateral hips and knees flexed, she straightens these independently without pain.  Follows commands in all 4 extremities. Eyes: Conjunctivae are normal. PERRL. EOMI. Head: Small hematoma to the right temple, closed without bleeding, about 3 cm in diameter. Nose: No congestion/rhinnorhea. Mouth/Throat: Mucous membranes are moist.  Oropharynx non-erythematous. Neck: No stridor. No cervical spine tenderness to palpation. Cardiovascular: Tachycardic rate, regular rhythm. Grossly normal heart sounds.  Good peripheral circulation. Respiratory: Normal respiratory effort.  No retractions. Lungs CTAB. Gastrointestinal:  Soft , nondistended, nontender to palpation. No CVA tenderness. Musculoskeletal: No lower extremity tenderness nor edema.  No joint effusions. No signs of acute trauma. Port to the right chest that is clean, dry without overlying skin changes. No deformity, tenderness or signs of trauma to all 4 extremities. No signs of trauma to the back or spinal step-offs. Neurologic:  Normal speech and language. No gross focal neurologic deficits are appreciated.  Skin:  Skin is warm, dry and intact. No rash noted. Psychiatric: Mood and affect are normal. Speech and behavior are normal.  ____________________________________________   LABS (all labs ordered are listed, but only abnormal results are displayed)  Labs Reviewed  CBC WITH DIFFERENTIAL/PLATELET - Abnormal; Notable for the following components:      Result  Value   RBC 3.49 (*)    Hemoglobin 10.6 (*)    HCT 30.8 (*)    RDW 19.4 (*)    Lymphs Abs 0.6 (*)    All other components within normal limits  COMPREHENSIVE METABOLIC PANEL - Abnormal; Notable for the following components:   Potassium 3.3 (*)    CO2 21 (*)    Glucose, Bld 107 (*)    Creatinine, Ser 0.39 (*)    Calcium 7.2 (*)    Total Protein 5.2 (*)    Albumin 2.6 (*)    AST 14 (*)    All other components within normal limits  URINALYSIS, COMPLETE (UACMP) WITH MICROSCOPIC   ____________________________________________  12 Lead EKG   ____________________________________________  RADIOLOGY  ED MD interpretation: 1 view CXR reviewed to me with mild right basilar atelectasis. CT head reviewed me without evidence of acute cranial pathology.  Official radiology report(s): CT HEAD WO CONTRAST (5MM)  Result Date: 11/07/2020 CLINICAL DATA:  Recurrent falls, RIGHT temporal trauma, history lung cancer EXAM: CT HEAD WITHOUT CONTRAST TECHNIQUE: Contiguous axial images were obtained from the base of the skull through the vertex without intravenous contrast. Sagittal and coronal MPR images reconstructed from axial data set. COMPARISON:  10/01/2020 FINDINGS: Brain: Generalized atrophy. Normal ventricular morphology. No midline shift or mass effect. Small vessel chronic ischemic changes of deep cerebral white matter. Chronic dural thickening at site of prior RIGHT frontal craniotomy. No intracranial hemorrhage, mass lesion, evidence of acute infarction, or extra-axial fluid collection. Vascular: Atherosclerotic calcification of internal carotid arteries at skull base. Aneurysm clip at distal RIGHT ICA. Skull: RIGHT frontal craniotomy changes. Remainder of calvarium intact Sinuses/Orbits: Clear Other: N/A IMPRESSION: Post craniotomy changes RIGHT frontal with evidence of prior RIGHT distal ICA aneurysm clipping. Atrophy with small vessel chronic ischemic changes of deep cerebral white matter. No  acute intracranial abnormalities. Electronically Signed   By: Lavonia Dana M.D.   On: 11/07/2020 17:36   CT CERVICAL SPINE WO CONTRAST  Result Date: 11/07/2020 CLINICAL DATA:  Multiple falls, RIGHT temporal trauma, history lung cancer EXAM: CT CERVICAL SPINE WITHOUT CONTRAST TECHNIQUE: Multidetector CT imaging of the cervical spine was performed without intravenous contrast. Multiplanar CT image reconstructions were also generated. COMPARISON:  None FINDINGS: Alignment: Minimal anterolisthesis at C4-C5 and C7-T1, likely due to degenerative disc and facet disease. Remaining alignments normal Skull base and vertebrae: Osseous demineralization. Multilevel degenerative facet disease changes. Degenerative disc disease changes at C5-C7. Skull base intact. No fracture, additional subluxation, or bone destruction. Soft tissues and spinal canal: Prevertebral soft tissues normal thickness. Extensive atherosclerotic calcifications at the carotid bifurcations. Disc levels:  No specific abnormalities Upper chest: Minimal biapical scarring. Other: N/A IMPRESSION: Multilevel degenerative disc and facet disease  changes of the cervical spine. Minimal anterolisthesis at C4-C5 and C7-T1, likely due to degenerative disc and facet disease changes. No acute cervical spine abnormalities. Electronically Signed   By: Lavonia Dana M.D.   On: 11/07/2020 17:39   DG Chest Portable 1 View  Result Date: 11/07/2020 CLINICAL DATA:  Lung cancer, increasing weakness, fall, cough EXAM: PORTABLE CHEST 1 VIEW COMPARISON:  Portable exam 1701 hours compared to 07/30/2018 FINDINGS: RIGHT jugular Port-A-Cath with tip projecting over SVC near cavoatrial junction. Normal heart size and pulmonary vascularity. Atherosclerotic calcification aorta. Small hiatal hernia. Volume loss in lower RIGHT lung with atelectasis and depression of minor fissure. Underlying emphysematous and bronchitic changes consistent with COPD. Remaining lungs clear. No infiltrate,  pleural effusion or pneumothorax. IMPRESSION: COPD changes with RIGHT basilar atelectasis. Aortic Atherosclerosis (ICD10-I70.0) and Emphysema (ICD10-J43.9). Electronically Signed   By: Lavonia Dana M.D.   On: 11/07/2020 17:29    ____________________________________________   PROCEDURES and INTERVENTIONS  Procedure(s) performed (including Critical Care):  .1-3 Lead EKG Interpretation  Date/Time: 11/07/2020 5:42 PM Performed by: Vladimir Crofts, MD Authorized by: Vladimir Crofts, MD     Interpretation: abnormal     ECG rate:  102   ECG rate assessment: tachycardic     Rhythm: sinus tachycardia     Ectopy: none     Conduction: normal    Medications  heparin flush 10 UNIT/ML injection 10 Units (has no administration in time range)  lactated ringers bolus 1,000 mL (0 mLs Intravenous Stopped 11/07/20 1955)  acetaminophen (TYLENOL) tablet 1,000 mg (1,000 mg Oral Given 11/07/20 1756)  oxyCODONE (Oxy IR/ROXICODONE) immediate release tablet 5 mg (5 mg Oral Given 11/07/20 1832)  potassium chloride SA (KLOR-CON) CR tablet 40 mEq (40 mEq Oral Given 11/07/20 1831)    ____________________________________________   MDM / ED COURSE   Advanced staged cancer patient presents to the ED with worsening weakness and recurrent falls, without evidence of acute pathology, and amenable to outpatient management.  She has a small hematoma to her head, but no scalp laceration or evidence of significant trauma to the extremities.  No signs of neurologic or vascular deficits.  CT head and neck without evidence of ICH, cervical fracture or skull fracture.  Blood work with mild hypokalemia that was repleted orally.  Urine without infectious features.  Largely nondiagnostic work-up and I see no barriers to continued outpatient management.  Patient reports that she has an upcoming evaluation with hospice, which I think is very reasonable resource for them.  We discussed return precautions for the ED.  Patient suitable for outpatient  management.  Clinical Course as of 11/07/20 2030  Mon Nov 07, 2020  1912 Reassessed.  Patient reports feeling okay.  Awaiting urine sample.  We discussed largely nondiagnostic work-up so far [DS]  1956 Reassessed.  Patient reports feeling okay.  We discussed further nondiagnostic work-up and no evidence of acute pathology.  We discussed the possibility of outpatient management and she is requesting discharge, saying that she would be fine going home.  We discussed following up with her PCP and return precautions. We discussed her possibly staying with family, such as her daughter and son-in-law, she seems hesitant about this and is not sure if it is a possibility. [DS]  1957 I call daughter, Barnetta Chapel. No response. Left HIPAA compliant voicemail [DS]  2026 Son in law at the bedside.  We discussed work-up and he reports that patient's daughter will be spending the night with her tonight.  They tell me they  have an upcoming visit with PCP and a an evaluation with hospice care.  We discussed return precautions and the value of hospice.  They are all comfortable with discharge, which I think is reasonable. [DS]    Clinical Course User Index [DS] Vladimir Crofts, MD    ____________________________________________   FINAL CLINICAL IMPRESSION(S) / ED DIAGNOSES  Final diagnoses:  Recurrent falls     ED Discharge Orders     None        Qiana Landgrebe Tamala Julian   Note:  This document was prepared using Dragon voice recognition software and may include unintentional dictation errors.    Vladimir Crofts, MD 11/07/20 2032

## 2020-11-08 ENCOUNTER — Telehealth: Payer: Self-pay

## 2020-11-08 ENCOUNTER — Telehealth (INDEPENDENT_AMBULATORY_CARE_PROVIDER_SITE_OTHER): Payer: Medicare Other | Admitting: Internal Medicine

## 2020-11-08 ENCOUNTER — Encounter: Payer: Self-pay | Admitting: Hospice and Palliative Medicine

## 2020-11-08 ENCOUNTER — Encounter: Payer: Self-pay | Admitting: Internal Medicine

## 2020-11-08 ENCOUNTER — Ambulatory Visit
Admission: RE | Admit: 2020-11-08 | Discharge: 2020-11-08 | Disposition: A | Discharge status: Home / Self Care | Payer: Medicare Other | Source: Ambulatory Visit | Attending: Internal Medicine | Admitting: Internal Medicine

## 2020-11-08 ENCOUNTER — Ambulatory Visit
Admission: RE | Admit: 2020-11-08 | Discharge: 2020-11-08 | Disposition: A | Discharge status: Home / Self Care | Payer: Medicare Other | Attending: Internal Medicine | Admitting: Internal Medicine

## 2020-11-08 VITALS — HR 78 | Ht 62.0 in | Wt 105.0 lb

## 2020-11-08 DIAGNOSIS — W19XXXD Unspecified fall, subsequent encounter: Secondary | ICD-10-CM

## 2020-11-08 DIAGNOSIS — M79604 Pain in right leg: Secondary | ICD-10-CM | POA: Diagnosis not present

## 2020-11-08 DIAGNOSIS — R52 Pain, unspecified: Secondary | ICD-10-CM

## 2020-11-08 MED ORDER — TRAMADOL HCL 50 MG PO TABS: ORAL_TABLET | ORAL | 0 refills | Status: DC

## 2020-11-08 MED ORDER — HYDROCODONE-ACETAMINOPHEN 5-300 MG PO TABS: ORAL_TABLET | ORAL | 0 refills | Status: DC

## 2020-11-08 NOTE — Telephone Encounter (Signed)
Left vm for patient to return call to schedule hospital f/u-Toni

## 2020-11-08 NOTE — Progress Notes (Signed)
[facility] [place], [place] [address]  Internal MEDICINE  Telephone Visit  Patient Name: Latoya Lynch  [id number]  [id number]  Date of Service: 11/08/2020  I connected with the patient at 924 by telephone and verified the patients identity using two identifiers.   I discussed the limitations, risks, security and privacy concerns of performing an evaluation and management service by telephone and the availability of in person appointments. I also discussed with the patient that there may be a patient responsible charge related to the service.  The patient expressed understanding and agrees to proceed.    Chief Complaint  Patient presents with   Telephone Assessment    ED follow up for recent falls    Telephone Screen     [phone]   Fatigue   Pain    Right leg    HPI Pt went to ED for frequent falls. C/o right leg pain after fall, daughter has been with the patient since her discharge from ED and is really concerned about her leg patient has been moaning and controlling with age movement of the leg there were no x-rays done at the time.  Daughter is trying to get some help at home  she was pt with multiple medical problems including stage IV lung cancer status post SBRT now with metastasis to liver.  Current Medication: Outpatient Encounter Medications as of 11/08/2020  Medication Sig Note   buPROPion (WELLBUTRIN SR) 150 MG 12 hr tablet Take 1 tablet (150 mg total) by mouth daily. Take 1 tablet in the morning    EPINEPHrine 0.3 mg/0.3 mL IJ SOAJ injection Inject 0.3 mg into the muscle as needed for anaphylaxis. 08/19/2020: prn   escitalopram (LEXAPRO) 10 MG tablet Take one tab a day between 5-7 pm    HYDROcodone-Acetaminophen 5-300 MG TABS Take one tab po bid prn for pain    loratadine (CLARITIN) 10 MG tablet Take 10 mg by mouth daily.    Omega-3 1000 MG CAPS Take 1,000 mg by mouth daily.    potassium chloride (KLOR-CON) 20 MEQ packet Take 20 mEq  by mouth daily.    traMADol (ULTRAM) 50 MG tablet Take one tab po bid for pain as needed    vitamin B-12 (CYANOCOBALAMIN) 500 MCG tablet Take 500 mcg by mouth daily.    [DISCONTINUED] traMADol (ULTRAM) 50 MG tablet Take 1 tablet (50 mg total) by mouth every 8 (eight) hours as needed.    [DISCONTINUED] LORazepam (ATIVAN) 0.5 MG tablet Take 1 tablet (0.5 mg total) by mouth every 6 (six) hours as needed (Nausea or vomiting). (Patient not taking: Reported on 11/08/2020)    No facility-administered encounter medications on file as of 11/08/2020.    Surgical History: Past Surgical History:  Procedure Laterality Date   CATARACT EXTRACTION W/ INTRAOCULAR LENS  IMPLANT, BILATERAL     CEREBRAL ANEURYSM REPAIR  01/2003   ELECTROMAGNETIC NAVIGATION BROCHOSCOPY Right 07/30/2018   Procedure: ELECTROMAGNETIC NAVIGATION BRONCHOSCOPY RIGHT;  Surgeon: Tyler Pita, MD;  Location: [facility] ORS;  Service: Cardiopulmonary;  Laterality: Right;   ENDOBRONCHIAL ULTRASOUND Right 07/30/2018   Procedure: ENDOBRONCHIAL ULTRASOUND RIGHT;  Surgeon: Tyler Pita, MD;  Location: [facility] ORS;  Service: Cardiopulmonary;  Laterality: Right;   PORTA CATH INSERTION N/A 09/16/2020   Procedure: PORTA CATH INSERTION;  Surgeon: Algernon Huxley, MD;  Location: [place] [facility];  Service: Cardiovascular;  Laterality: N/A;   retinal detatchment Right 05/2017   TONSILLECTOMY     TOTAL HIP ARTHROPLASTY Right  01/2015   TOTAL HIP ARTHROPLASTY Left 05/2019   in [place] History: Past Medical History:  Diagnosis Date   Arthritis    COPD (chronic obstructive pulmonary disease) ([place])    Depression    History of methicillin resistant staphylococcus aureus (MRSA)    Hyperlipidemia    Lung cancer ([place])    Lung mass    right lung   Osteoarthritis     Family History: Family History  Problem Relation Age of Onset   Heart attack Mother    Cancer Maternal Grandmother 80       spine ca   Heart  attack Maternal Grandfather     Social History   Socioeconomic History   Marital status: Widowed    Spouse name: Not on file   Number of children: 3   Years of education: Not on file   Highest education level: Not on file  Occupational History   Not on file  Tobacco Use   Smoking status: Every Day    Packs/day: 0.25    Years: 60.00    Pack years: 15.00    Types: Cigarettes   Smokeless tobacco: Never   Tobacco comments:    about 3-4 cigarettes a day-10/18/20 and working on decreasing and working towards quitting  Vaping Use   Vaping Use: Never used  Substance and Sexual Activity   Alcohol use: Not Currently   Drug use: Never   Sexual activity: Not Currently  Other Topics Concern   Not on file  Social History Narrative   Lives by herself with a Risk analyst    Social Determinants of Health   Financial Resource Strain: Not on file  Food Insecurity: Not on file  Transportation Needs: Not on file  Physical Activity: Not on file  Stress: Not on file  Social Connections: Not on file  Intimate Partner Violence: Not on file      Review of Systems  Constitutional:  Negative for chills, fatigue and unexpected weight change.  HENT:  Negative for congestion, postnasal drip, rhinorrhea, sneezing and sore throat.   Eyes:  Negative for redness.  Respiratory:  Negative for cough, chest tightness and shortness of breath.   Cardiovascular:  Negative for chest pain and palpitations.  Gastrointestinal:  Negative for abdominal pain, constipation, diarrhea, nausea and vomiting.  Genitourinary:  Negative for dysuria and frequency.  Musculoskeletal:  Positive for gait problem. Negative for arthralgias, back pain, joint swelling and neck pain.       LEG PAIN ( R)   Skin:  Negative for rash.  Neurological:  Negative for tremors and numbness.  Hematological:  Negative for adenopathy. Does not bruise/bleed easily.  Psychiatric/Behavioral:  Negative for behavioral problems (Depression),  sleep disturbance and suicidal ideas. The patient is not nervous/anxious.    Vital Signs: Pulse 78   Ht 5\' 2"  (1.575 m)   Wt 105 lb (47.6 kg)   BMI 19.20 kg/m    Observation/Objective: She is able to communicate, lying in bed     Assessment/Plan: 1. Pain aggravated by standing It is very hard to assess the patient and limitation in her range of motion over the phone x-rays were not done by ED. daughter will try to arrange help  to take her to the radiology department x-rays for further evaluation and treatment.  Pain control as needed and start tramadol as needed for moderate pain and hydrocodone for severe pain as prescribed today - DG Tibia/Fibula Right;  Future - DG Knee Complete 4 Views Right; Future - DG FEMUR 1V RIGHT; Future  2. Fall, subsequent encounter Patient will need assistance for ADLs - DG Tibia/Fibula Right; Future - DG Knee Complete 4 Views Right; Future - DG FEMUR 1V RIGHT; Future   General Counseling: treesa mccully understanding of the findings of today's phone visit and agrees with plan of treatment. I have discussed any further diagnostic evaluation that may be needed or ordered today. We also reviewed her medications today. she has been encouraged to call the office with any questions or concerns that should arise related to todays visit.    Orders Placed This Encounter  Procedures   DG Tibia/Fibula Right   DG Knee Complete 4 Views Right   DG FEMUR 1V RIGHT    Meds ordered this encounter  Medications   traMADol (ULTRAM) 50 MG tablet    Sig: Take one tab po bid for pain as needed    Dispense:  45 tablet    Refill:  0   HYDROcodone-Acetaminophen 5-300 MG TABS    Sig: Take one tab po bid prn for pain    Dispense:  10 tablet    Refill:  0     Time spent:25 Minutes    Dr Lavera Guise Internal medicine

## 2020-11-09 ENCOUNTER — Telehealth: Payer: Self-pay

## 2020-11-09 MED ORDER — HYDROCODONE-ACETAMINOPHEN 5-325 MG PO TABS: ORAL_TABLET | ORAL | 0 refills | Status: DC

## 2020-11-09 MED ORDER — HYDROCODONE-ACETAMINOPHEN 5-325MG PREPACK (~~LOC~~: ORAL_TABLET | ORAL | 0 refills | Status: DC

## 2020-11-09 NOTE — Addendum Note (Signed)
Addended by: Lavera Guise on: 11/09/2020 02:18 PM   Modules accepted: Orders

## 2020-11-09 NOTE — Telephone Encounter (Signed)
Called [facility] and canceled hydrocodone 5/300 she said its already canceled and dr Humphrey Rolls already send another pres also reply to pt that we already change pre and send to phar

## 2020-11-10 ENCOUNTER — Telehealth: Payer: Medicare Other | Admitting: Hospice and Palliative Medicine

## 2020-11-10 ENCOUNTER — Ambulatory Visit: Payer: Medicare Other | Admitting: Internal Medicine

## 2020-11-10 ENCOUNTER — Other Ambulatory Visit: Payer: Medicare Other | Admitting: Student

## 2020-11-10 ENCOUNTER — Telehealth: Payer: Self-pay

## 2020-11-10 ENCOUNTER — Other Ambulatory Visit: Payer: Self-pay

## 2020-11-10 DIAGNOSIS — R531 Weakness: Secondary | ICD-10-CM

## 2020-11-10 DIAGNOSIS — C3491 Malignant neoplasm of unspecified part of right bronchus or lung: Secondary | ICD-10-CM

## 2020-11-10 DIAGNOSIS — Z515 Encounter for palliative care: Secondary | ICD-10-CM

## 2020-11-10 DIAGNOSIS — R0602 Shortness of breath: Secondary | ICD-10-CM

## 2020-11-10 DIAGNOSIS — R52 Pain, unspecified: Secondary | ICD-10-CM | POA: Diagnosis not present

## 2020-11-10 NOTE — Telephone Encounter (Signed)
At request of Palliative NP, message sent to Dr. Janese Banks to request an order for hospice eval and inquire if she will act as attending of record

## 2020-11-10 NOTE — Telephone Encounter (Signed)
Verbal order for hospice eval received from Dr. Janese Banks. Hospice referral center notified.

## 2020-11-10 NOTE — Progress Notes (Signed)
[facility] Consult Note Telephone: [phone]  Fax: [phone]    Date of encounter: 11/10/20 11:02 AM PATIENT NAME: Latoya Lynch [address] [place] [address]   [phone] (home)  DOB: [birth year] MRN: [id number] PRIMARY CARE PROVIDER:    Lavera Guise, MD,  [place] [address] [phone]  REFERRING PROVIDER:   Lavera Guise, [place],  [place] [address] [phone]  RESPONSIBLE PARTY:    Contact Information     Name Relation Home Work Mobile   Mickle Asper Daughter   [phone]   Wende Mott [phone]          I met face to face with patient and family in the home. Palliative Care was asked to follow this patient by consultation request of  Dr. Janese Banks to address advance care planning and complex medical decision making. This is a follow up visit.                                   ASSESSMENT AND PLAN / RECOMMENDATIONS:   Advance Care Planning/Goals of Care: Goals include to maximize quality of life and symptom management. Our advance care planning conversation included a discussion about:    The value and importance of advance care planning  Experiences with loved ones who have been seriously ill or have died  Exploration of personal, cultural or spiritual beliefs that might influence medical decisions  Exploration of goals of care in the event of a sudden injury or illness  Identification and preparation of a healthcare agent  Code status reviewed; DNR filled out and left in the home. Decision not to resuscitate or to de-escalate disease focused treatments due to poor prognosis. CODE STATUS: DNR  Education provided on Palliative Medicine vs. Hospice services. We discussed her diagnoses; she would like to focus on quality of life. She has decided to no longer receive immunotherapy. Patient would be eligible for hospice services if she is no longer  receiving aggressive treatments. Patient being referred to hospice services due to her metastatic cancer. Hospice medical director in agreement with referral to hospice. Patient and daughter given opportunity to ask questions.   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 She is being referred to hospice.  Pain-norco 5/373m every 6 hours PRN.   Dyspnea-oxygen recommended at 2 lpm via nasal canula for comfort.   Generalized weakness-caregivers to start tomorrow; assist with daily care needs. Use walker for ambulation, w/c if having difficulty bearing weight, going longer distances.   Hospice notified of equipment needs: oxygen concentrator, transport w/c, bedside commode, hospital bed.   Follow up Palliative Care Visit: Palliative care will continue to follow for complex medical decision making, advance care planning, and clarification of goals. Return prn.  I spent 60 minutes providing this consultation. More than 50% of the time in this consultation was spent in counseling and care coordination.  PPS: 40%  HOSPICE ELIGIBILITY/DIAGNOSIS: TBD  Chief Complaint: Palliative medicine follow up visit.  HISTORY OF PRESENT ILLNESS:  PCherrill Lynch a 80y.o. year old female  with  stage IV lung cancer, with metastasis to liver; status post SBRT. COPD, arthritis, depression. Patient had last received immunotherapy on 11/01/20.   Patient has had a marked decline in the past six weeks. She has had multiple falls in the  past month. Daughter reports patient having four falls within 17 hours earlier this week. She was seen in ED on 11/07/20 due to her recurrent falls.  ED visit on 7/30 due to fall where she sustained closed fracture of right inferior pubic ramus. Patient with increased weakness, fatigue. She is having more difficulty bearing weight; requires assistance for transfers. Ambulating short  distances. She now requires assistance with all adl's, B & B incontinence. She endorses pain to her right leg, lower back, buttocks. Pain is a 5-6/10, worse with movement. She endorses shortness of breath occasionally at rest and with any exertion. Having regular bowel movements. Poor appetite, she is eating foods she enjoys and is drinking boost/ensure supplements. Her weight has been steady. Will have caregivers starting tomorrow, during the week and family will support patient on the weekend.   Patient is received resting on couch. Patient has more difficulty with word finding. She does grimace when changing positions going from lying to sitting.  A 10-point review of systems is negative, except for the pertinent positives and negatives detailed in the HPI.     History obtained from review of EMR, discussion with primary team, and interview with family, facility staff/caregiver and/or Latoya Lynch.  I reviewed available labs, medications, imaging, studies and related documents from the EMR.  Records reviewed and summarized above.    Physical Exam: Weight: 105 pounds, height: 5'2" Pulse 112, resp 20, b/p 124/70, sats 88-89% on room air Constitutional: NAD General: frail appearing, thin EYES: anicteric sclera, lids intact, no discharge  ENMT: intact hearing, oral mucous membranes moist, dentition intact CV: S1S2, RRR, no LE edema Pulmonary: right lobes diminished, shortness of breath with exertion, no cough, room air Abdomen:normo-active BS + 4 quadrants, soft and non tender GU: deferred MSK: sarcopenia, ambulatory Skin: cool and dry, pale in color; no rashes or wounds on visible skin Neuro: generalized weakness, A & O x 3, forgetful Psych: non-anxious affect Hem/lymph/immuno: no widespread bruising   Thank you for the opportunity to participate in the care of Latoya Lynch.  The palliative care team will continue to follow. Please call our office at [phone] if we can be  of additional assistance.   Ezekiel Slocumb, NP   COVID-19 PATIENT SCREENING TOOL Asked and negative response unless otherwise noted:   Have you had symptoms of covid, tested positive or been in contact with someone with symptoms/positive test in the past 5-10 days? No

## 2020-11-11 ENCOUNTER — Encounter: Payer: Self-pay | Admitting: *Deleted

## 2020-11-15 ENCOUNTER — Other Ambulatory Visit: Payer: Self-pay | Admitting: Hospice and Palliative Medicine

## 2020-11-15 MED ORDER — OXYCODONE HCL 5 MG PO TABS: 5.0000 mg | ORAL_TABLET | ORAL | 0 refills | Status: DC | PRN

## 2020-11-15 NOTE — Progress Notes (Signed)
Hospice requested oxycodone for pain. Rx sent.

## 2020-11-17 DIAGNOSIS — J449 Chronic obstructive pulmonary disease, unspecified: Secondary | ICD-10-CM | POA: Diagnosis not present

## 2020-11-21 ENCOUNTER — Telehealth: Payer: Self-pay | Admitting: Hospice and Palliative Medicine

## 2020-11-21 ENCOUNTER — Ambulatory Visit: Payer: Medicare Other | Admitting: Radiation Oncology

## 2020-11-21 DIAGNOSIS — Z515 Encounter for palliative care: Secondary | ICD-10-CM | POA: Insufficient documentation

## 2020-11-21 MED ORDER — MORPHINE SULFATE ER 15 MG PO TBCR: 15.0000 mg | EXTENDED_RELEASE_TABLET | Freq: Two times a day (BID) | ORAL | 0 refills | Status: DC

## 2020-11-21 NOTE — Telephone Encounter (Signed)
Error

## 2020-11-22 ENCOUNTER — Ambulatory Visit: Payer: Medicare Other | Admitting: Oncology

## 2020-11-22 ENCOUNTER — Inpatient Hospital Stay: Payer: Medicare Other | Attending: Oncology | Admitting: Hospice and Palliative Medicine

## 2020-11-22 ENCOUNTER — Other Ambulatory Visit: Payer: Medicare Other

## 2020-11-22 ENCOUNTER — Ambulatory Visit: Payer: Medicare Other

## 2020-11-22 DIAGNOSIS — Z515 Encounter for palliative care: Secondary | ICD-10-CM

## 2020-11-22 DIAGNOSIS — C3491 Malignant neoplasm of unspecified part of right bronchus or lung: Secondary | ICD-10-CM | POA: Diagnosis not present

## 2020-11-22 DIAGNOSIS — G893 Neoplasm related pain (acute) (chronic): Secondary | ICD-10-CM

## 2020-11-22 NOTE — Progress Notes (Signed)
Virtual Visit via Video Note  I connected with [name] on 11/22/20 at  8:30 AM EDT by a video enabled telemedicine application and verified that I am speaking with the correct person using two identifiers.  Location: Patient: Home Provider: Clinic   I discussed the limitations of evaluation and management by telemedicine and the availability of in person appointments. The patient expressed understanding and agreed to proceed.  History of Present Illness: Latoya Lynch is a 80 y.o. 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  Patient has had falls and word searching.  She was referred to palliative care to help address goals and manage ongoing symptoms.   Observations/Objective: Patient is now followed by hospice who is regularly been in communication with our office regarding symptom management.  Today, spoke with patient and daughter regarding pain management.  I started patient on MS Contin this week due to persistent generalized pain.  Patient is also having muscle spasms in her legs.  Too soon to tell if the MS Contin has helped.  May continue oxycodone as needed for breakthrough pain.  Hospice nurse, Judson Roch, to follow-up with patient/family this week.  Assessment and Plan: Stage IV lung cancer -best supportive care under hospice at home.  Neoplasm related pain -continue MS Contin/oxycodone  Follow Up Instructions: MyChart visit as needed   I discussed the assessment and treatment plan with the patient. The patient was provided an opportunity to ask questions and all were answered. The patient agreed with the plan and demonstrated an understanding of the instructions.   The patient was advised to call back or seek an in-person evaluation if the symptoms worsen or if the condition fails to improve as anticipated.  I provided 10 minutes of non-face-to-face time during this  encounter.   Irean Hong, NP

## 2020-12-11 DIAGNOSIS — Z743 Need for continuous supervision: Secondary | ICD-10-CM | POA: Diagnosis not present

## 2020-12-11 DIAGNOSIS — R0902 Hypoxemia: Secondary | ICD-10-CM | POA: Diagnosis not present

## 2020-12-11 DIAGNOSIS — R404 Transient alteration of awareness: Secondary | ICD-10-CM | POA: Diagnosis not present

## 2020-12-11 DIAGNOSIS — W19XXXA Unspecified fall, initial encounter: Secondary | ICD-10-CM | POA: Diagnosis not present

## 2020-12-12 ENCOUNTER — Emergency Department

## 2020-12-12 ENCOUNTER — Other Ambulatory Visit: Payer: Self-pay | Admitting: Hospice and Palliative Medicine

## 2020-12-12 ENCOUNTER — Emergency Department
Admission: EM | Admit: 2020-12-12 | Discharge: 2020-12-12 | Disposition: A | Discharge status: Home / Self Care | Attending: Emergency Medicine | Admitting: Emergency Medicine

## 2020-12-12 DIAGNOSIS — Z91010 Allergy to peanuts: Secondary | ICD-10-CM | POA: Diagnosis not present

## 2020-12-12 DIAGNOSIS — F17210 Nicotine dependence, cigarettes, uncomplicated: Secondary | ICD-10-CM | POA: Insufficient documentation

## 2020-12-12 DIAGNOSIS — Z96643 Presence of artificial hip joint, bilateral: Secondary | ICD-10-CM | POA: Insufficient documentation

## 2020-12-12 DIAGNOSIS — Z9889 Other specified postprocedural states: Secondary | ICD-10-CM | POA: Diagnosis not present

## 2020-12-12 DIAGNOSIS — J449 Chronic obstructive pulmonary disease, unspecified: Secondary | ICD-10-CM | POA: Diagnosis not present

## 2020-12-12 DIAGNOSIS — Z85118 Personal history of other malignant neoplasm of bronchus and lung: Secondary | ICD-10-CM | POA: Insufficient documentation

## 2020-12-12 DIAGNOSIS — S0990XA Unspecified injury of head, initial encounter: Secondary | ICD-10-CM | POA: Diagnosis not present

## 2020-12-12 DIAGNOSIS — W1789XA Other fall from one level to another, initial encounter: Secondary | ICD-10-CM | POA: Insufficient documentation

## 2020-12-12 DIAGNOSIS — Z79899 Other long term (current) drug therapy: Secondary | ICD-10-CM | POA: Insufficient documentation

## 2020-12-12 DIAGNOSIS — I672 Cerebral atherosclerosis: Secondary | ICD-10-CM | POA: Diagnosis not present

## 2020-12-12 DIAGNOSIS — W19XXXA Unspecified fall, initial encounter: Secondary | ICD-10-CM

## 2020-12-12 MED ORDER — MORPHINE SULFATE ER 15 MG PO TBCR: 15.0000 mg | EXTENDED_RELEASE_TABLET | Freq: Two times a day (BID) | ORAL | 0 refills | Status: DC

## 2020-12-12 NOTE — Progress Notes (Signed)
Hospice nurse, Judson Roch, requested refill of MS Contin.

## 2020-12-12 NOTE — Discharge Instructions (Addendum)

## 2020-12-12 NOTE — ED Provider Notes (Signed)
[facility] Emergency Department Provider Note  ____________________________________________  Time seen: Approximately 1:51 AM  I have reviewed the triage vital signs and the nursing notes.   HISTORY  Chief Complaint Fall   HPI Latoya Lynch is a 80 y.o. female with history of stage IV squamous cell carcinoma of the lung on hospice care, COPD, hyperlipidemia, arthritis who presents for evaluation of a fall.  History is gathered mostly from the daughter who was at bedside.  Patient has been sleeping on the couch since that she was placed in hospice because she feels more comfortable.  This evening she rolled out of the couch and hit her head onto the floor.  She is not on blood thinners and has had no LOC.  Patient denies headache or neck pain, chest pain, shortness of breath, nausea, vomiting, dysuria or hematuria, abdominal pain, back pain, or extremity pain.   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/26/2020   Bilateral cataracts 07/26/2020   Disorder of lower respiratory system 07/26/2020   History of surgery 07/26/2020   Intracranial aneurysm 07/26/2020   Moderate aortic valve regurgitation 07/26/2020   History of total hip arthroplasty 06/02/2019   Abnormal gait 03/30/2019   Hip pain 03/30/2019   Chronic obstructive lung disease ([place]) 03/27/2018   Tobacco dependence due to cigarettes 03/27/2018   Mass of lower lobe of right lung 12/19/2017   Retinal detachment 05/16/2017     Past Surgical History:  Procedure Laterality Date   CATARACT EXTRACTION W/ INTRAOCULAR LENS  IMPLANT, BILATERAL     CEREBRAL ANEURYSM REPAIR  01/2003   ELECTROMAGNETIC NAVIGATION BROCHOSCOPY Right 07/30/2018   Procedure: ELECTROMAGNETIC NAVIGATION BRONCHOSCOPY RIGHT;  Surgeon: Tyler Pita, MD;  Location: [facility] ORS;  Service: Cardiopulmonary;  Laterality: Right;   ENDOBRONCHIAL ULTRASOUND Right 07/30/2018   Procedure: ENDOBRONCHIAL ULTRASOUND RIGHT;  Surgeon: Tyler Pita, MD;  Location: [facility] ORS;  Service: Cardiopulmonary;  Laterality: Right;   PORTA CATH INSERTION N/A 09/16/2020   Procedure: PORTA CATH INSERTION;  Surgeon: Algernon Huxley, MD;  Location: [facility];  Service: Cardiovascular;  Laterality: N/A;   retinal detatchment Right 05/2017   TONSILLECTOMY     TOTAL HIP ARTHROPLASTY Right 01/2015   TOTAL HIP ARTHROPLASTY Left 05/2019   in [place]      Prior to Admission medications   Medication Sig Start Date End Date Taking? Authorizing Provider  buPROPion (WELLBUTRIN SR) 150 MG 12 hr tablet Take 1 tablet (150 mg total) by mouth daily. Take 1 tablet in the morning 10/20/20   Lavera Guise, MD  EPINEPHrine 0.3 mg/0.3 mL IJ SOAJ injection Inject 0.3 mg into the muscle as needed for anaphylaxis.    [provider]  escitalopram (LEXAPRO) 10 MG tablet Take one tab a day between 5-7 pm 10/20/20   Lavera Guise, MD  loratadine (CLARITIN) 10 MG tablet Take 10 mg by mouth daily.    [provider]  morphine (MS CONTIN) 15 MG 12 hr tablet Take 1 tablet (15 mg total) by mouth every 12 (twelve) hours. 11/21/20   Borders, Kirt Boys, NP  Omega-3 1000 MG CAPS Take 1,000 mg by mouth daily.    [provider]  oxyCODONE (OXY IR/ROXICODONE) 5 MG immediate release tablet Take 1 tablet (5 mg total) by mouth every 4 (four) hours as needed for severe pain. 11/15/20   Borders, Kirt Boys, NP  potassium chloride (KLOR-CON) 20 MEQ packet Take 20  mEq by mouth daily. 11/01/20   Sindy Guadeloupe, MD  vitamin B-12 (CYANOCOBALAMIN) 500 MCG tablet Take 500 mcg by mouth daily.    [provider]    Allergies Other, Peanut oil, and Prochlorperazine  Family History  Problem Relation Age of Onset   Heart attack Mother    Cancer Maternal Grandmother 80       spine ca   Heart attack Maternal Grandfather     Social History Social History   Tobacco Use   Smoking status: Every Day    Packs/day: 0.25    Years: 60.00    Pack years: 15.00    Types: Cigarettes   Smokeless tobacco: Never   Tobacco comments:    about 3-4 cigarettes a day-10/18/20 and working on decreasing and working towards quitting  Vaping Use   Vaping Use: Never used  Substance Use Topics   Alcohol use: Not Currently   Drug use: Never    Review of Systems  Constitutional: Negative for fever. Eyes: Negative for visual changes. ENT: Negative for facial injury or neck injury Cardiovascular: Negative for chest injury. Respiratory: Negative for shortness of breath. Negative for chest wall injury. Gastrointestinal: Negative for abdominal pain or injury. Genitourinary: Negative for dysuria. Musculoskeletal: Negative for back injury, negative for arm or leg pain. Skin: Negative for laceration/abrasions. Neurological: + head injury.   ____________________________________________   PHYSICAL EXAM:  VITAL SIGNS:  Vitals:   12/12/20 0016 12/12/20 0225  BP: 122/77 122/67  Pulse: (!) 101 99  Resp: 16 16  Temp: 98.1 F (36.7 C)   SpO2: 92% 94%    Full spinal precautions maintained throughout the trauma exam. Constitutional: Alert and oriented x 2. No acute distress. Does not appear intoxicated. HEENT Head: Normocephalic and atraumatic. Face: No facial bony tenderness. Stable midface Ears: No hemotympanum bilaterally. No Battle sign Eyes: No eye injury. PERRL. No raccoon eyes Nose: Nontender. No epistaxis. No rhinorrhea Mouth/Throat: Mucous  membranes are moist. No oropharyngeal blood. No dental injury. Airway patent without stridor. Normal voice. Neck: no C-collar. No midline c-spine tenderness.  Cardiovascular: Normal rate, regular rhythm. Normal and symmetric distal pulses are present in all extremities. Pulmonary/Chest: Chest wall is stable and nontender to palpation/compression. Normal respiratory effort. Breath sounds are normal. No crepitus.  Abdominal: Soft, nontender, non distended. Musculoskeletal: Nontender with normal full range of motion in all extremities. No deformities. No thoracic or lumbar midline spinal tenderness. Pelvis is stable. Skin: Skin is warm, dry and intact. No abrasions or contutions. Psychiatric: Speech and behavior are appropriate. Neurological: Normal speech and language. Moves all extremities to command. No gross focal neurologic deficits are appreciated.  Glascow Coma Score: 4 - Opens eyes on own 6 - Follows simple motor commands 4 - Seems confused, disoriented GCS: 14   ____________________________________________   LABS (all labs ordered are listed, but only abnormal results are displayed)  Labs Reviewed - No data to display ____________________________________________  EKG  none  ____________________________________________  RADIOLOGY  I have personally reviewed the images performed during this visit and I agree with the Radiologist's read.   Interpretation by Radiologist:  CT HEAD WO CONTRAST (5MM)  Result Date: 12/12/2020 CLINICAL DATA:  Head trauma, minor EXAM: CT HEAD WITHOUT CONTRAST TECHNIQUE: Contiguous axial images were obtained from the base of the skull through the vertex without intravenous contrast. COMPARISON:  11/07/2020 FINDINGS: Brain: Diffuse cerebral atrophy. Ventricular dilatation consistent with central atrophy. Low-attenuation changes in the deep white matter consistent with small vessel ischemia. No abnormal extra-axial fluid collections. No mass effect or  midline shift. Gray-white matter junctions are distinct. Basal cisterns are not effaced. No acute intracranial hemorrhage. Vascular: Moderate intracranial arterial vascular calcifications. Aneurysm clip over the right parasellar region. Skull: Postoperative changes with right frontotemporal craniotomy. No acute depressed skull fractures. Sinuses/Orbits: Paranasal sinuses and mastoid air cells are clear. Postoperative changes in the globes. Other: None. IMPRESSION: 1. No acute intracranial abnormalities. 2. Chronic atrophy and small vessel ischemic changes. 3. Postoperative changes with previous right-sided aneurysm clipping. Electronically Signed   By: Lucienne Capers M.D.   On: 12/12/2020 01:58     ____________________________________________   PROCEDURES  Procedure(s) performed: None Procedures Critical Care performed:  None ____________________________________________   INITIAL IMPRESSION / ASSESSMENT AND PLAN / ED COURSE  80 y.o. female with history of stage IV squamous cell carcinoma of the lung on hospice care, COPD, hyperlipidemia, arthritis who presents for evaluation of a fall.  Patient rolled out of the couch this evening and fell on the floor hitting her head on the floor.  No LOC.  She is not on blood thinners.  Currently at baseline per daughter who was at bedside.  She has no signs of trauma on physical exam and denies any pain.  Head CT showed no signs of traumatic injury.  Patient neurologically intact.  Plan to discharge home to the care of the daughter with follow-up with PCP.  Discussed my standard return precautions      ____________________________________________  Please note:  Patient was evaluated in Emergency Department today for the symptoms described in the history of present illness. Patient was evaluated in the context of the global COVID-19 pandemic, which necessitated consideration that the patient might be at risk for infection with the SARS-CoV-2 virus that  causes COVID-19. Institutional protocols and algorithms that pertain to the evaluation of patients at risk for COVID-19 are in a state of rapid change based on information released by regulatory bodies including the CDC and federal and state organizations. These policies and algorithms were followed during the patient's care in the ED.  Some ED evaluations and interventions may be delayed as a result of limited staffing during the pandemic.   ____________________________________________   FINAL CLINICAL IMPRESSION(S) / ED DIAGNOSES   Final diagnoses:  Fall, initial encounter      NEW MEDICATIONS STARTED DURING THIS VISIT:  ED Discharge Orders     None        Note:  This document was prepared using Dragon voice recognition software and may include unintentional dictation errors.    Rudene Re, MD 12/12/20 [phone]

## 2020-12-12 NOTE — ED Triage Notes (Signed)
From home by EMS for fall from sofa; Hx of Hospice/ DNR; reports right face pain, skin intact; respirations even non-labored;

## 2020-12-12 NOTE — ED Notes (Signed)
Patient transported to CT 

## 2020-12-13 ENCOUNTER — Other Ambulatory Visit: Payer: Self-pay | Admitting: Internal Medicine

## 2020-12-14 ENCOUNTER — Encounter: Payer: Self-pay | Admitting: Oncology

## 2020-12-15 ENCOUNTER — Encounter: Payer: Self-pay | Admitting: Internal Medicine

## 2020-12-16 ENCOUNTER — Telehealth: Payer: Self-pay | Admitting: Hospice and Palliative Medicine

## 2020-12-16 ENCOUNTER — Encounter: Payer: Self-pay | Admitting: Oncology

## 2020-12-16 NOTE — Telephone Encounter (Signed)
I spoke with patient's hospice nurse, Judson Roch.  Patient is actively terminal but having significant restlessness and agitation.  Okay to start Seroquel 25 mg twice daily per protocol.

## 2020-12-19 ENCOUNTER — Other Ambulatory Visit: Payer: Self-pay | Admitting: Hospice and Palliative Medicine

## 2020-12-19 ENCOUNTER — Inpatient Hospital Stay: Payer: Medicare Other | Admitting: Physician Assistant

## 2020-12-19 DIAGNOSIS — Z0289 Encounter for other administrative examinations: Secondary | ICD-10-CM

## 2020-12-19 MED ORDER — MORPHINE SULFATE (CONCENTRATE) 10 MG /0.5 ML PO SOLN: 5.0000 mg | ORAL | 0 refills | Status: DC | PRN

## 2020-12-19 NOTE — Progress Notes (Signed)
Hospice nurse requested morphine elixir given patient decline. WIll send Rx.

## 2020-12-21 ENCOUNTER — Other Ambulatory Visit: Payer: Self-pay | Admitting: *Deleted

## 2020-12-21 MED ORDER — OXYCODONE HCL 5 MG PO TABS: 5.0000 mg | ORAL_TABLET | ORAL | 0 refills | Status: DC | PRN

## 2021-01-09 ENCOUNTER — Other Ambulatory Visit: Payer: Self-pay | Admitting: Hospice and Palliative Medicine

## 2021-01-09 MED ORDER — MORPHINE SULFATE ER 15 MG PO TBCR: 15.0000 mg | EXTENDED_RELEASE_TABLET | Freq: Two times a day (BID) | ORAL | 0 refills | Status: AC

## 2021-01-09 MED ORDER — OXYCODONE HCL 5 MG PO TABS: 5.0000 mg | ORAL_TABLET | ORAL | 0 refills | Status: AC | PRN

## 2021-01-09 NOTE — Progress Notes (Signed)
Spoke with patient's hospice nurse.  Refill of oxycodone/MS Contin was requested.  Will send to pharmacy.

## 2021-01-20 ENCOUNTER — Other Ambulatory Visit: Payer: Self-pay | Admitting: Hospice and Palliative Medicine

## 2021-01-20 MED ORDER — MORPHINE SULFATE (CONCENTRATE) 10 MG /0.5 ML PO SOLN: 20.0000 mg | ORAL | 0 refills | Status: AC | PRN

## 2021-02-02 NOTE — Progress Notes (Signed)
Hospice nurse requested refill of morphine elixir. Patient is reportedly actively dying.

## 2021-02-02 DEATH — deceased

## 2022-09-05 IMAGING — US US BIOPSY CORE LIVER
1 series · 15 of 15 positions shown · non-contrast
Comparison: none

INDICATION: 80-year-old woman with history of lung cancer and new left liver
lesions presents to interventional radiology for ultrasound-guided
biopsy.

[Series 1: us biopsy · 15 of 15 slices shown]
[im 1/15]
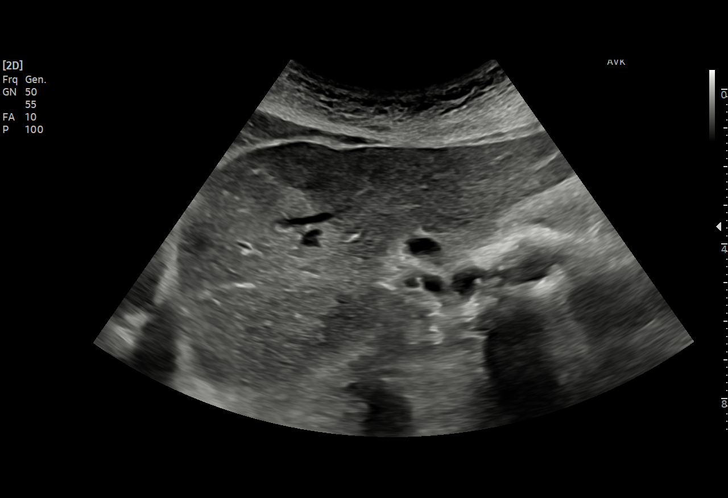
[im 2/15]
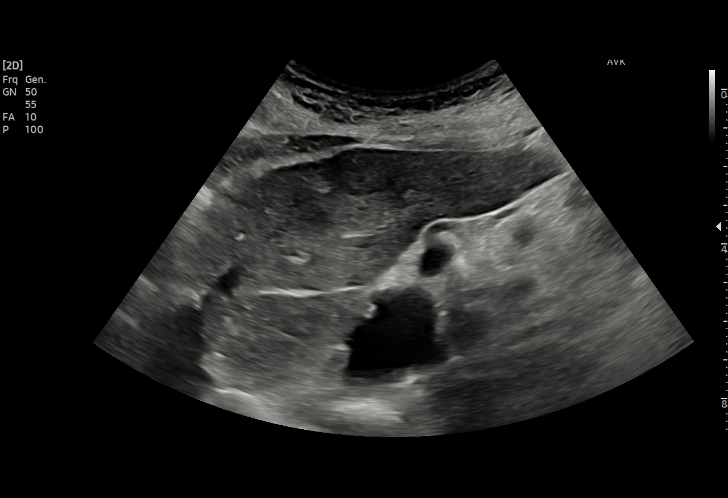
[im 3/15]
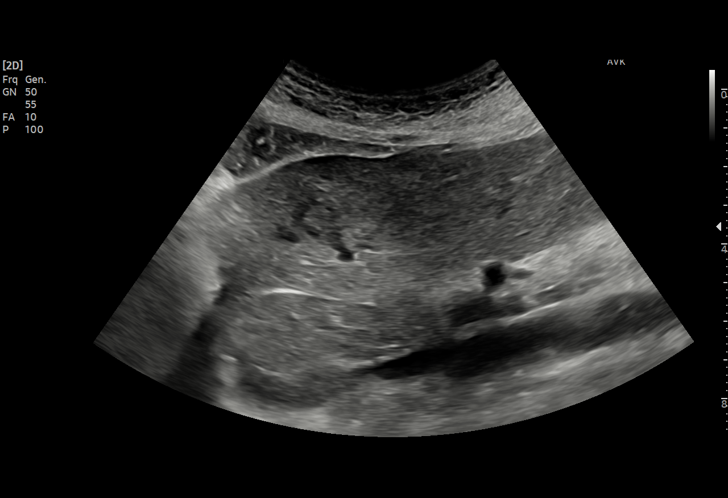
[im 4/15]
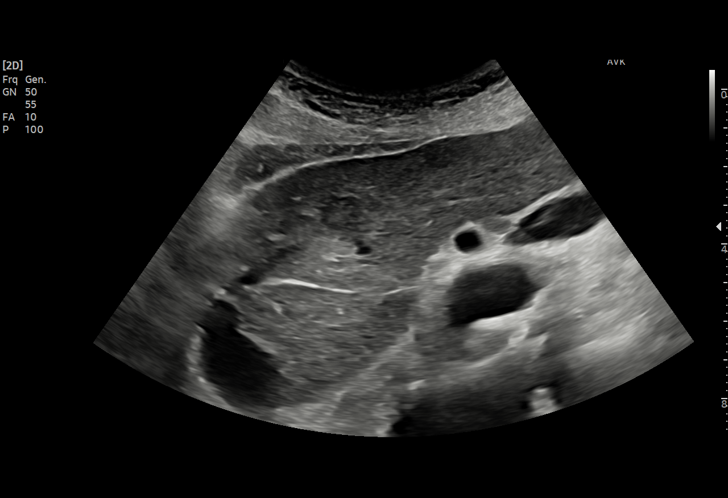
[im 5/15]
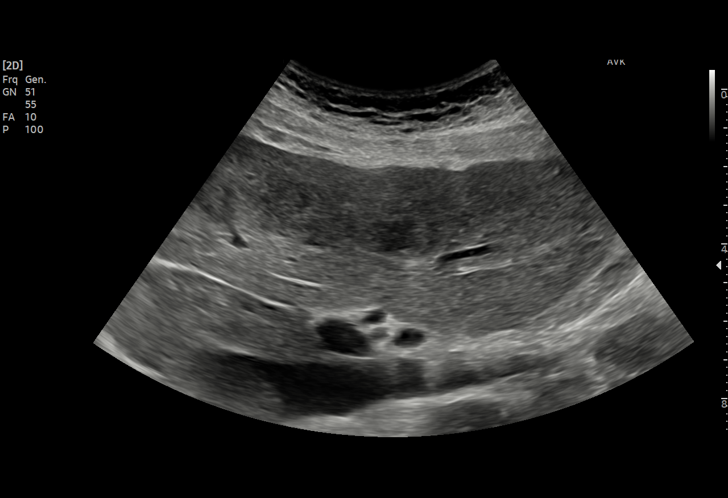
[im 6/15]
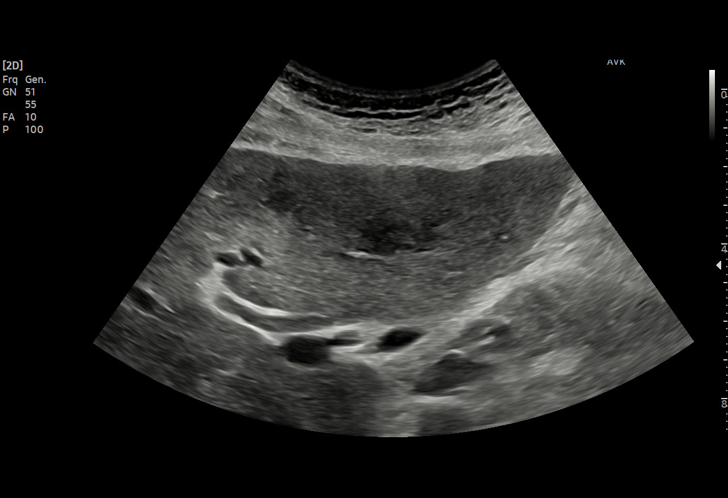
[im 7/15]
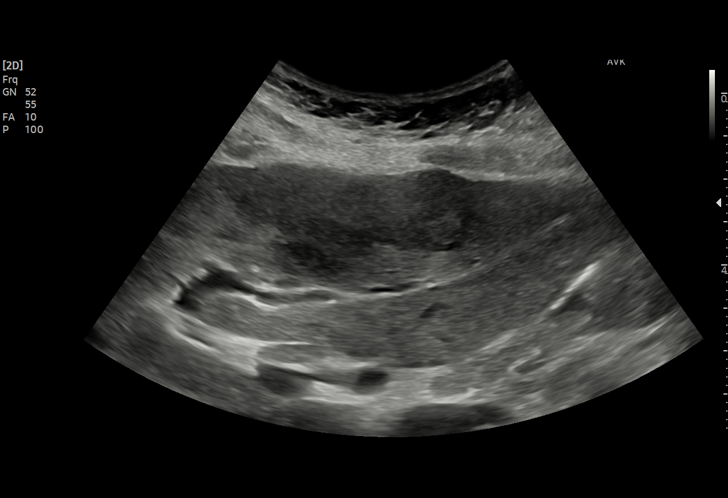
[im 8/15]
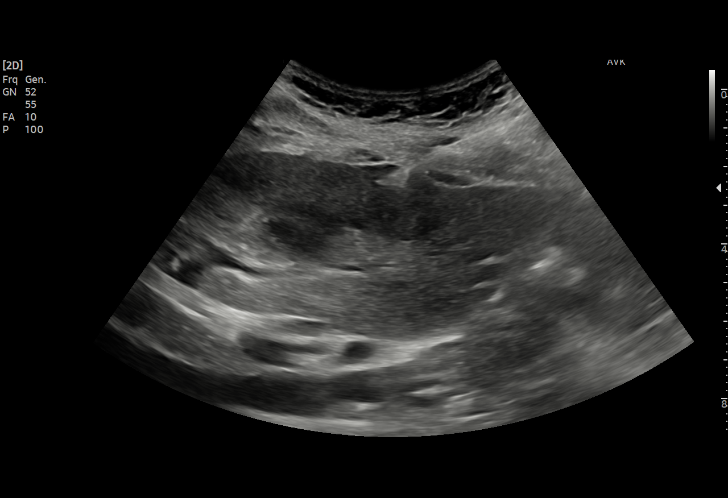
[im 9/15]
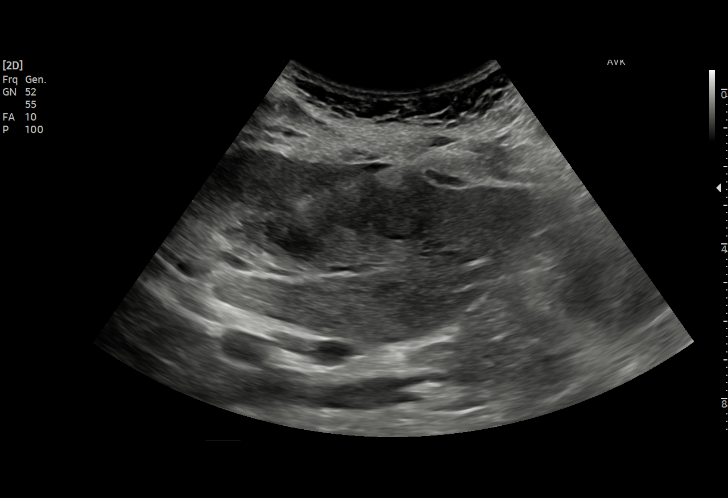
[im 10/15]
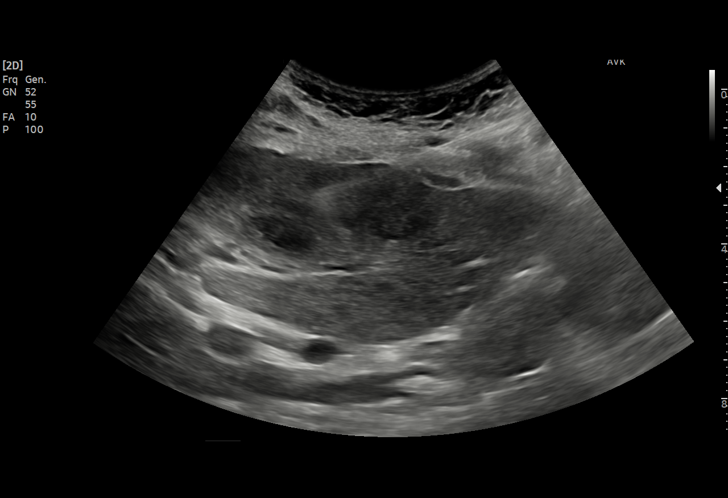
[im 11/15]
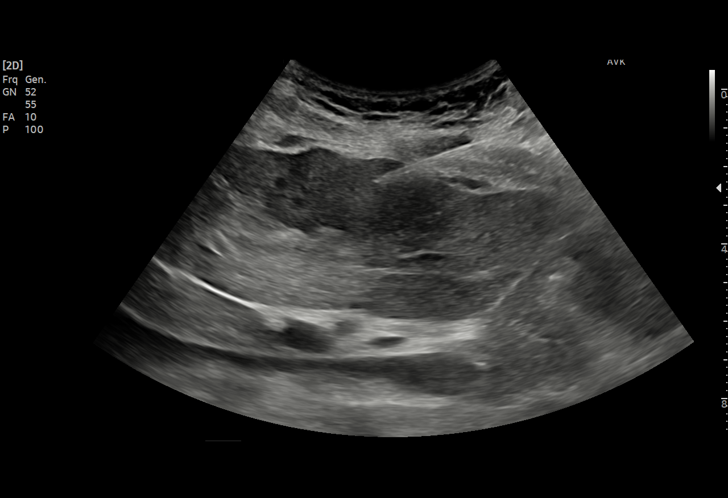
[im 12/15]
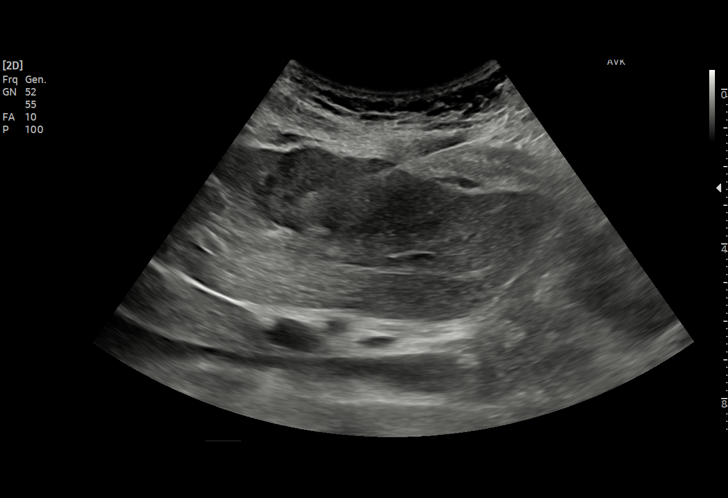
[im 13/15]
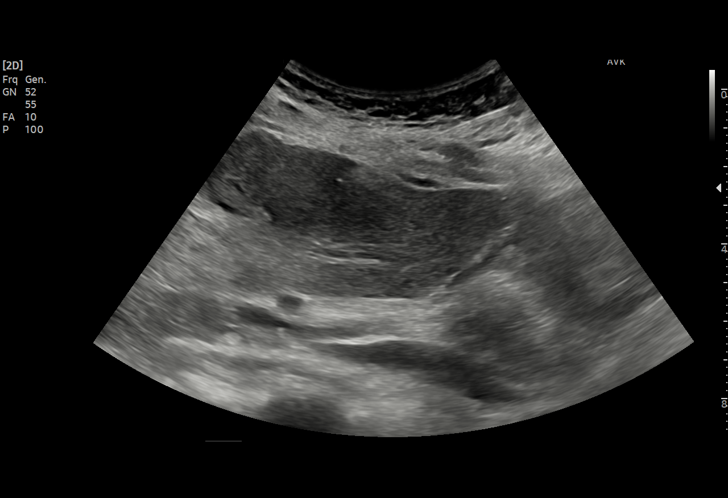
[im 14/15]
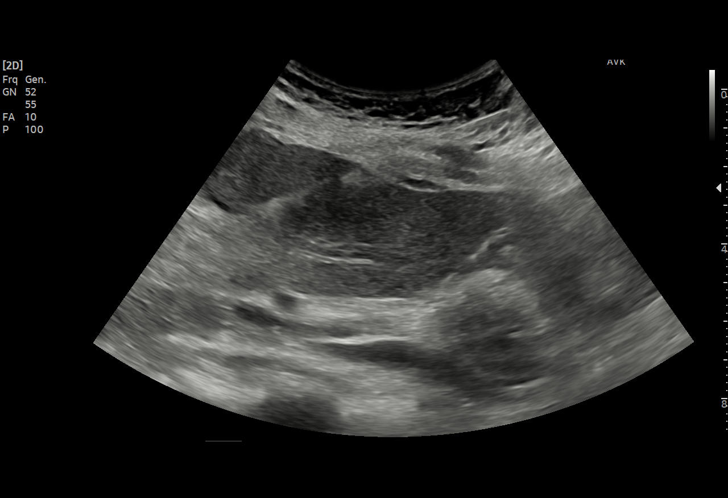
[im 15/15]
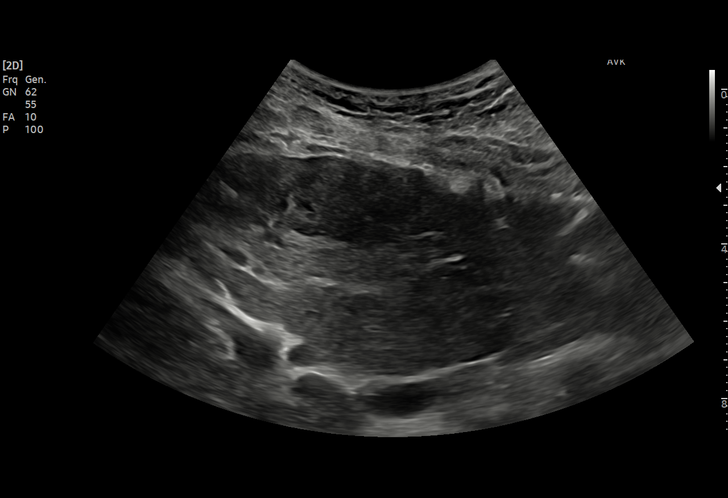

[15 of 15 positions shown; findings below may reference images not displayed]

EXAM:
Ultrasound-guided biopsy of left liver mass

MEDICATIONS:
None.

ANESTHESIA/SEDATION:
Moderate (conscious) sedation was employed during this procedure. A
total of Versed 2 mg and Fentanyl 100 mcg was administered
intravenously.

Moderate Sedation Time: 8 minutes. The patient's level of
consciousness and vital signs were monitored continuously by
radiology nursing throughout the procedure under my direct
supervision.

COMPLICATIONS:
None immediate.

PROCEDURE:
Informed written consent was obtained from the patient after a
thorough discussion of the procedural risks, benefits and
alternatives. All questions were addressed. Maximal Sterile Barrier
Technique was utilized including caps, mask, sterile gowns, sterile
gloves, sterile drape, hand hygiene and skin antiseptic. A timeout
was performed prior to the initiation of the procedure.

Patient position supine on the ultrasound table.

Epigastric skin prepped and draped in usual sterile fashion.

Following local lidocaine administration, three 18 gauge cores were
obtained from 1 of the left liver lesions utilizing continuous
ultrasound guidance.

Samples were sent to pathology in formalin.

Needle removed and hemostasis achieved with 5 minutes of manual
compression.

Post procedure ultrasound images showed no evidence of significant
hemorrhage.
IMPRESSION: Ultrasound-guided biopsy of left liver mass.

## 2022-10-29 IMAGING — CT CT HEAD W/O CM
3 series · 15 of 47 positions shown, 18 images · non-contrast
Comparison: MRI brain 09/01/2018

CLINICAL DATA: Fall.

EXAM:
CT HEAD WITHOUT CONTRAST
TECHNIQUE: Contiguous axial images were obtained from the base of the skull
through the vertex without intravenous contrast.

[Series 2: head wo · axial · 0.39mm/px · z∈[-81,+59]mm · 9 of 34 slices shown, 12 images]
[im 3/34  brain]
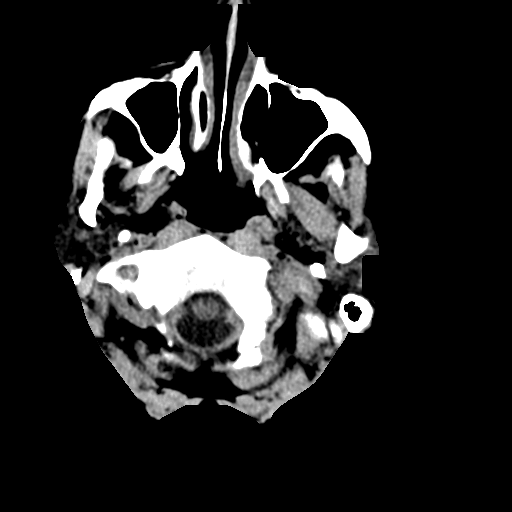
[im 3/34  bone]
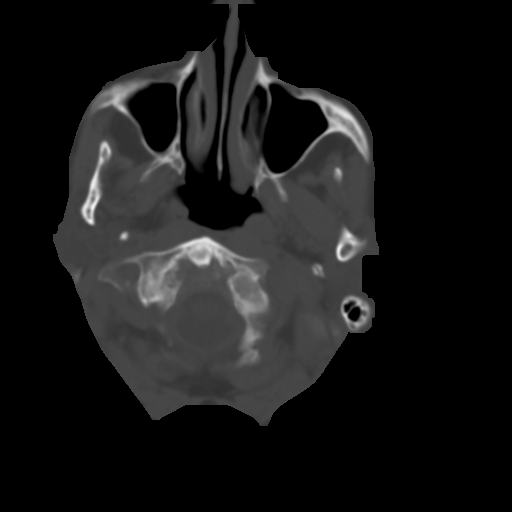
[im 6/34  brain]
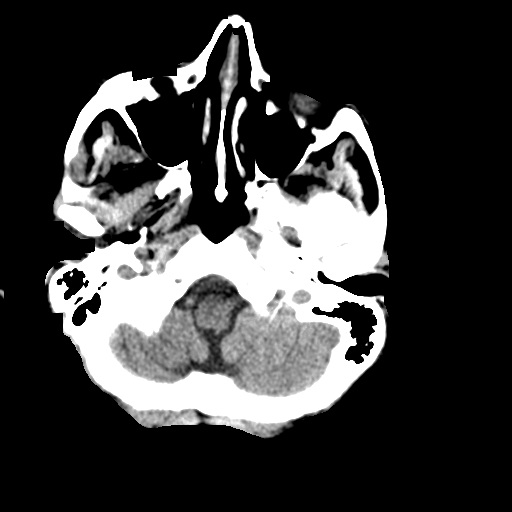
[im 10/34  brain]
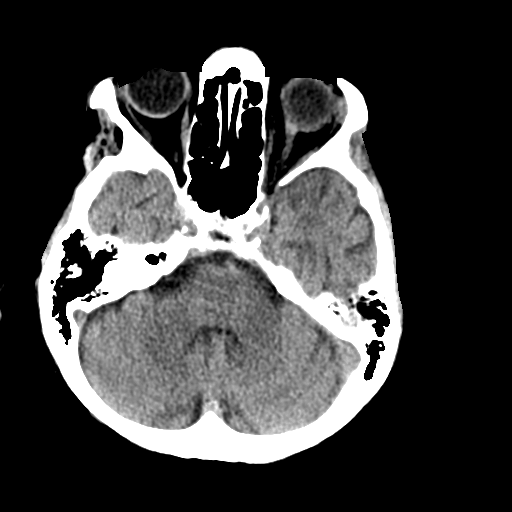
[im 13/34  brain]
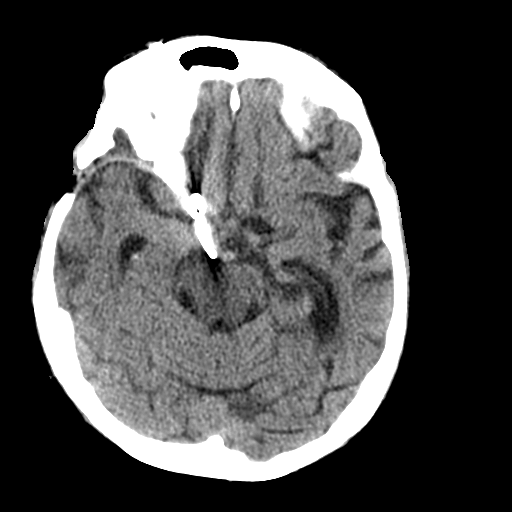
[im 18/34  brain]
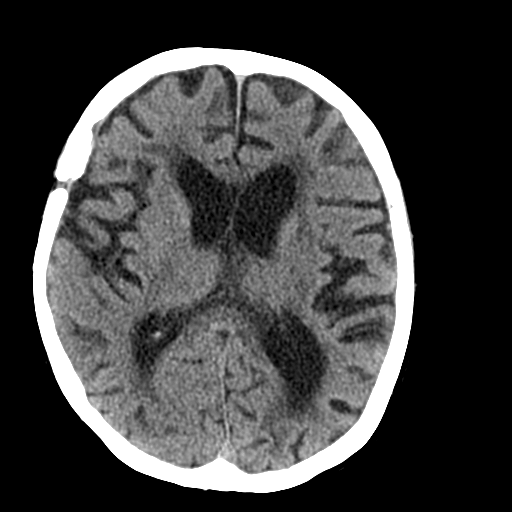
[im 18/34  bone]
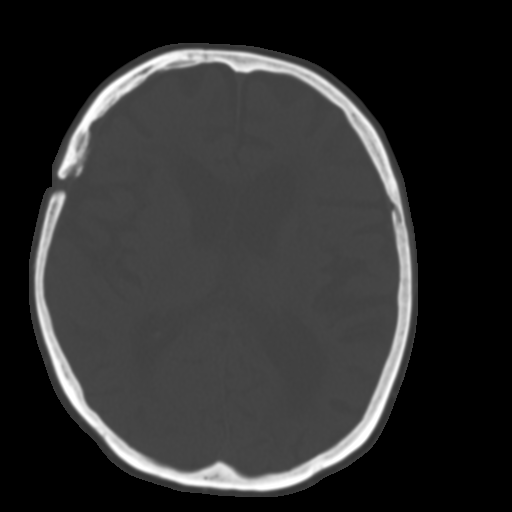
[im 21/34  brain]
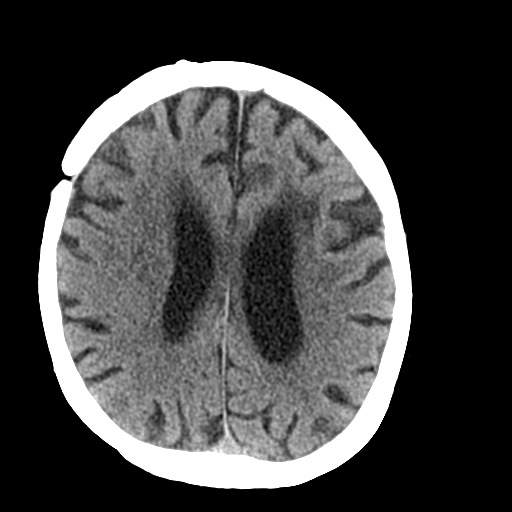
[im 24/34  brain]
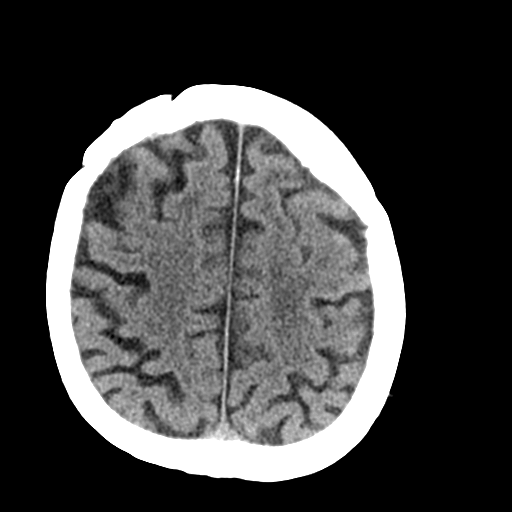
[im 28/34  brain]
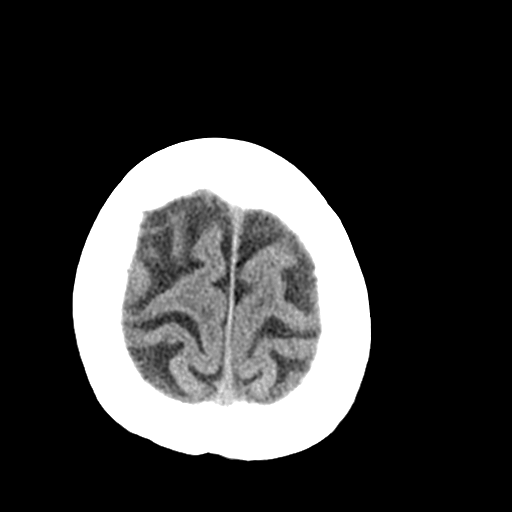
[im 31/34  brain]
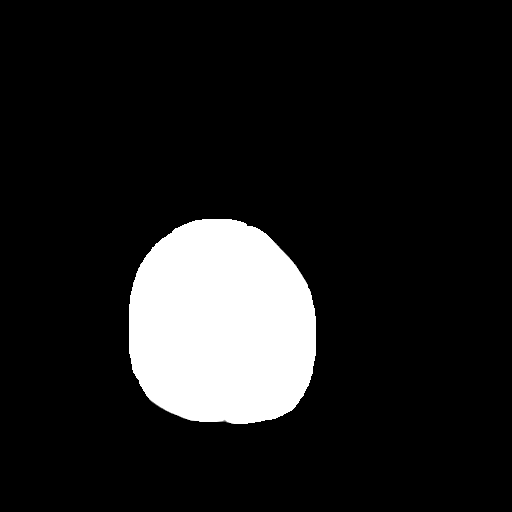
[im 31/34  bone]
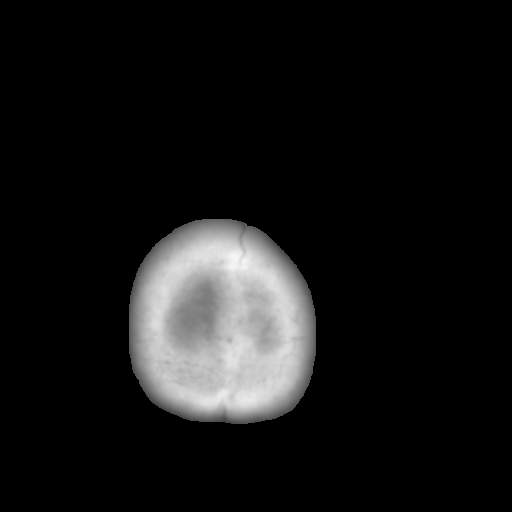

[Series 4: coronal soft tissue · coronal · 0.35mm/px · 3 of 70 slices shown]
[im 24/70  brain]
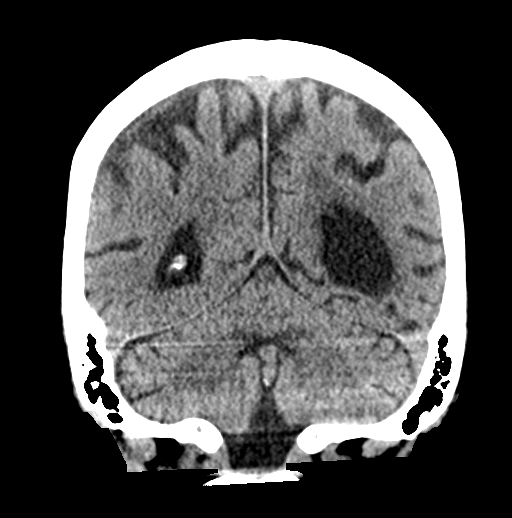
[im 31/70  brain]
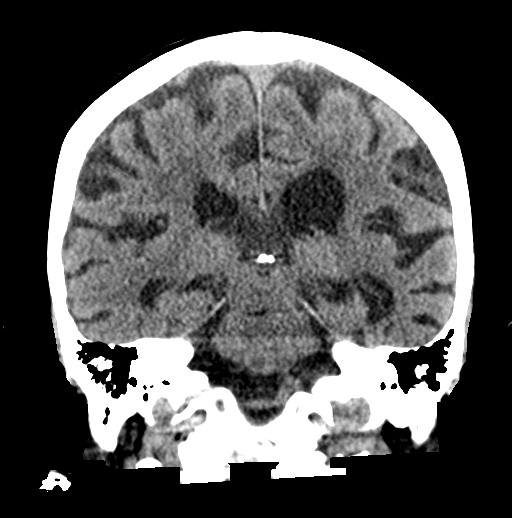
[im 39/70  brain]
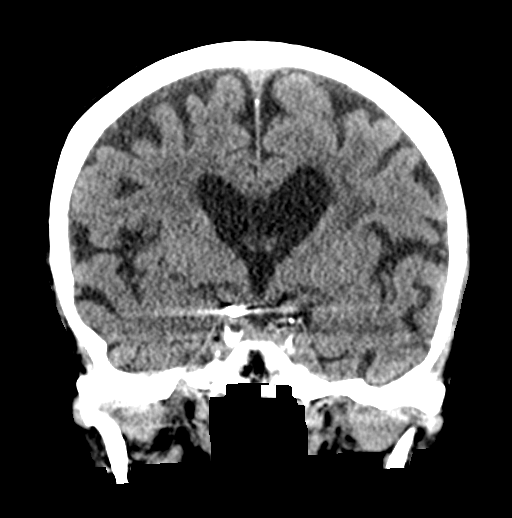

[Series 5: sagittal soft tissue · sagittal · 0.32mm/px · 3 of 59 slices shown]
[im 20/59  brain]
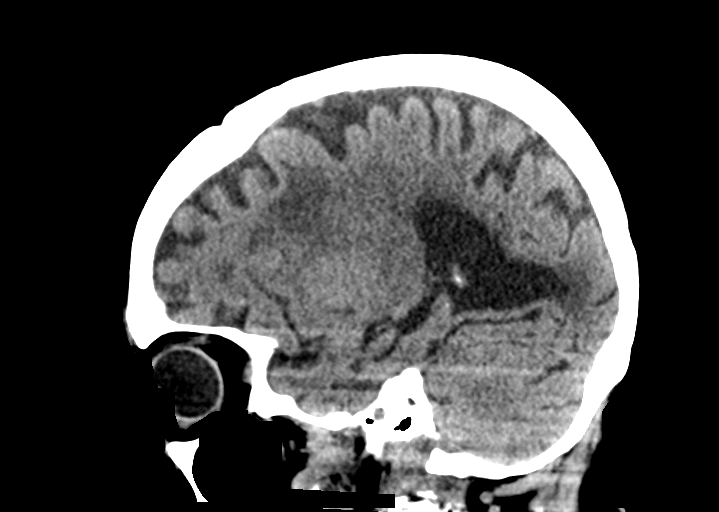
[im 30/59  brain]
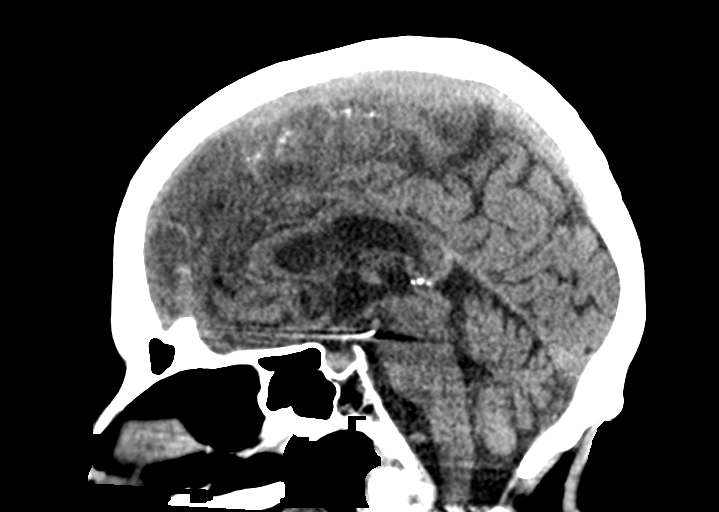
[im 39/59  brain]
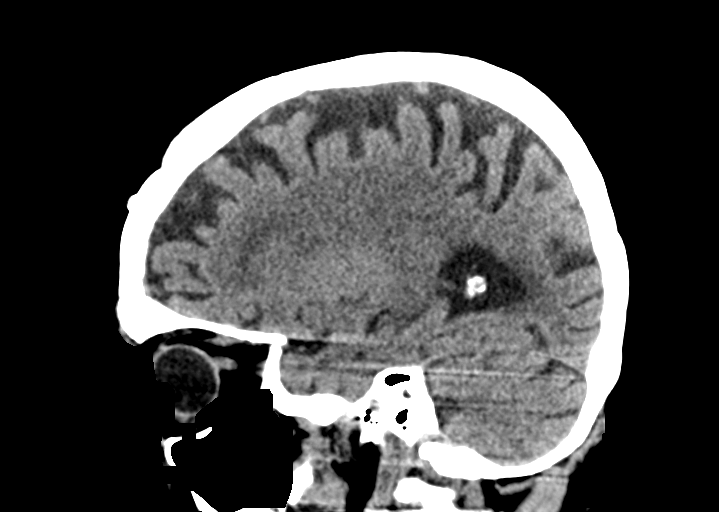

[15 of 47 positions shown; findings below may reference images not displayed]

FINDINGS: Brain: No evidence of acute infarction, hemorrhage, hydrocephalus,
extra-axial collection or mass lesion/mass effect. Prominence of the
sulci and ventricles compatible with brain atrophy. Again seen are
remote small infarcts involving the left superior frontal gyrus and
posterior aspect of the right gyrus rectus. There is mild diffuse
low-attenuation within the subcortical and periventricular white
matter compatible with chronic microvascular disease. Signs of
previous right frontal craniotomy with underlying dural thickening
and calcifications.

Vascular: Right MCA aneurysm clip. No hyperdense vessel or
unexpected calcification.

Skull: Previous right frontal craniotomy. Negative for acute
fracture or focal lesion.

Sinuses/Orbits: Paranasal sinuses appear clear. Mastoid air cells
are also clear.

Other: None
IMPRESSION: 1. No acute intracranial abnormalities.
2. Chronic small vessel ischemic disease and brain atrophy.
3. Remote small infarcts involving the left superior frontal gyrus
and posterior aspect of the right gyrus rectus.
4. Signs of previous right frontal craniotomy with underlying dural
thickening and calcifications.
